# Patient Record
Sex: Female | Born: 1975
Health system: Southern US, Community
[De-identification: ages and names within clinical notes are randomized; demographics above are authoritative.]

## PROBLEM LIST (undated history)

## (undated) DIAGNOSIS — F410 Panic disorder [episodic paroxysmal anxiety] without agoraphobia: Secondary | ICD-10-CM

## (undated) DIAGNOSIS — R000 Tachycardia, unspecified: Secondary | ICD-10-CM

## (undated) DIAGNOSIS — K219 Gastro-esophageal reflux disease without esophagitis: Secondary | ICD-10-CM

## (undated) DIAGNOSIS — E063 Autoimmune thyroiditis: Secondary | ICD-10-CM

## (undated) DIAGNOSIS — E039 Hypothyroidism, unspecified: Secondary | ICD-10-CM

## (undated) DIAGNOSIS — T8859XA Other complications of anesthesia, initial encounter: Secondary | ICD-10-CM

## (undated) DIAGNOSIS — R42 Dizziness and giddiness: Secondary | ICD-10-CM

## (undated) DIAGNOSIS — T7840XA Allergy, unspecified, initial encounter: Secondary | ICD-10-CM

## (undated) DIAGNOSIS — T4145XA Adverse effect of unspecified anesthetic, initial encounter: Secondary | ICD-10-CM

## (undated) DIAGNOSIS — R87619 Unspecified abnormal cytological findings in specimens from cervix uteri: Secondary | ICD-10-CM

## (undated) DIAGNOSIS — K5790 Diverticulosis of intestine, part unspecified, without perforation or abscess without bleeding: Secondary | ICD-10-CM

## (undated) DIAGNOSIS — B159 Hepatitis A without hepatic coma: Secondary | ICD-10-CM

## (undated) DIAGNOSIS — F419 Anxiety disorder, unspecified: Secondary | ICD-10-CM

## (undated) DIAGNOSIS — I10 Essential (primary) hypertension: Secondary | ICD-10-CM

## (undated) DIAGNOSIS — IMO0001 Reserved for inherently not codable concepts without codable children: Secondary | ICD-10-CM

## (undated) DIAGNOSIS — D649 Anemia, unspecified: Secondary | ICD-10-CM

## (undated) DIAGNOSIS — D1802 Hemangioma of intracranial structures: Secondary | ICD-10-CM

## (undated) DIAGNOSIS — R011 Cardiac murmur, unspecified: Secondary | ICD-10-CM

## (undated) DIAGNOSIS — G43909 Migraine, unspecified, not intractable, without status migrainosus: Secondary | ICD-10-CM

## (undated) DIAGNOSIS — N946 Dysmenorrhea, unspecified: Secondary | ICD-10-CM

## (undated) HISTORY — PX: SEPTOPLASTY: SUR1290

## (undated) HISTORY — DX: Diverticulosis of intestine, part unspecified, without perforation or abscess without bleeding: K57.90

## (undated) HISTORY — DX: Reserved for inherently not codable concepts without codable children: IMO0001

## (undated) HISTORY — DX: Unspecified abnormal cytological findings in specimens from cervix uteri: R87.619

## (undated) HISTORY — DX: Tachycardia, unspecified: R00.0

## (undated) HISTORY — DX: Anemia, unspecified: D64.9

## (undated) HISTORY — DX: Gastro-esophageal reflux disease without esophagitis: K21.9

## (undated) HISTORY — DX: Panic disorder (episodic paroxysmal anxiety): F41.0

## (undated) HISTORY — DX: Essential (primary) hypertension: I10

## (undated) HISTORY — DX: Anxiety disorder, unspecified: F41.9

## (undated) HISTORY — DX: Dizziness and giddiness: R42

## (undated) HISTORY — PX: DILATION AND CURETTAGE OF UTERUS: SHX78

## (undated) HISTORY — DX: Dysmenorrhea, unspecified: N94.6

## (undated) HISTORY — DX: Hypothyroidism, unspecified: E03.9

## (undated) HISTORY — DX: Cardiac murmur, unspecified: R01.1

## (undated) HISTORY — DX: Allergy, unspecified, initial encounter: T78.40XA

## (undated) HISTORY — DX: Autoimmune thyroiditis: E06.3

## (undated) HISTORY — DX: Hemangioma of intracranial structures: D18.02

## (undated) HISTORY — DX: Hepatitis a without hepatic coma: B15.9

## (undated) HISTORY — DX: Migraine, unspecified, not intractable, without status migrainosus: G43.909

---
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
## 2005-01-26 DIAGNOSIS — R87619 Unspecified abnormal cytological findings in specimens from cervix uteri: Secondary | ICD-10-CM

## 2005-01-26 HISTORY — PX: COLPOSCOPY: SHX161

## 2005-01-26 HISTORY — DX: Unspecified abnormal cytological findings in specimens from cervix uteri: R87.619

## 2005-01-26 HISTORY — PX: CERVICAL BIOPSY  W/ LOOP ELECTRODE EXCISION: SUR135

## 2005-02-11 ENCOUNTER — Encounter: Admission: RE | Admit: 2005-02-11 | Discharge: 2005-02-11 | Payer: Self-pay | Admitting: *Deleted

## 2007-02-01 ENCOUNTER — Inpatient Hospital Stay (HOSPITAL_COMMUNITY): Admission: AD | Admit: 2007-02-01 | Discharge: 2007-02-01 | Payer: Self-pay | Admitting: Obstetrics & Gynecology

## 2007-04-19 ENCOUNTER — Encounter: Admission: RE | Admit: 2007-04-19 | Discharge: 2007-04-19 | Payer: Self-pay | Admitting: Obstetrics and Gynecology

## 2007-05-23 ENCOUNTER — Encounter: Admission: RE | Admit: 2007-05-23 | Discharge: 2007-05-23 | Payer: Self-pay | Admitting: Obstetrics and Gynecology

## 2008-03-13 ENCOUNTER — Inpatient Hospital Stay (HOSPITAL_COMMUNITY): Admission: AD | Admit: 2008-03-13 | Discharge: 2008-03-15 | Payer: Self-pay | Admitting: Obstetrics & Gynecology

## 2009-02-01 DIAGNOSIS — K5792 Diverticulitis of intestine, part unspecified, without perforation or abscess without bleeding: Secondary | ICD-10-CM | POA: Insufficient documentation

## 2009-02-11 ENCOUNTER — Encounter: Admission: RE | Admit: 2009-02-11 | Discharge: 2009-02-11 | Payer: Self-pay | Admitting: Family Medicine

## 2009-02-19 ENCOUNTER — Encounter: Admission: RE | Admit: 2009-02-19 | Discharge: 2009-02-19 | Payer: Self-pay | Admitting: Sports Medicine

## 2010-01-26 DIAGNOSIS — R000 Tachycardia, unspecified: Secondary | ICD-10-CM

## 2010-01-26 DIAGNOSIS — F419 Anxiety disorder, unspecified: Secondary | ICD-10-CM

## 2010-01-26 DIAGNOSIS — F410 Panic disorder [episodic paroxysmal anxiety] without agoraphobia: Secondary | ICD-10-CM

## 2010-01-26 HISTORY — DX: Tachycardia, unspecified: R00.0

## 2010-01-26 HISTORY — DX: Panic disorder (episodic paroxysmal anxiety): F41.0

## 2010-01-26 HISTORY — DX: Anxiety disorder, unspecified: F41.9

## 2010-02-16 ENCOUNTER — Encounter: Payer: Self-pay | Admitting: Obstetrics and Gynecology

## 2010-03-29 ENCOUNTER — Emergency Department (HOSPITAL_COMMUNITY): Payer: PRIVATE HEALTH INSURANCE

## 2010-03-29 ENCOUNTER — Emergency Department (HOSPITAL_COMMUNITY)
Admission: EM | Admit: 2010-03-29 | Discharge: 2010-03-29 | Disposition: A | Discharge status: Home / Self Care | Payer: PRIVATE HEALTH INSURANCE | Attending: Emergency Medicine | Admitting: Emergency Medicine

## 2010-03-29 DIAGNOSIS — F410 Panic disorder [episodic paroxysmal anxiety] without agoraphobia: Secondary | ICD-10-CM | POA: Insufficient documentation

## 2010-03-29 DIAGNOSIS — R0789 Other chest pain: Secondary | ICD-10-CM | POA: Insufficient documentation

## 2010-03-29 DIAGNOSIS — E039 Hypothyroidism, unspecified: Secondary | ICD-10-CM | POA: Insufficient documentation

## 2010-03-29 DIAGNOSIS — R0682 Tachypnea, not elsewhere classified: Secondary | ICD-10-CM | POA: Insufficient documentation

## 2010-03-29 LAB — URINALYSIS, ROUTINE W REFLEX MICROSCOPIC
Bilirubin Urine: NEGATIVE
Glucose, UA: NEGATIVE mg/dL
Ketones, ur: NEGATIVE mg/dL
Leukocytes, UA: NEGATIVE
Nitrite: NEGATIVE
Protein, ur: 30 mg/dL — AB
Specific Gravity, Urine: 1.014 (ref 1.005–1.030)
Urobilinogen, UA: 0.2 mg/dL (ref 0.0–1.0)
pH: 7.5 (ref 5.0–8.0)

## 2010-03-29 LAB — POCT CARDIAC MARKERS
CKMB, poc: <1 ng/mL — ABNORMAL LOW (ref 1.0–8.0)
Myoglobin, poc: 50.9 ng/mL (ref 12–200)
Troponin i, poc: <0.05 ng/mL (ref 0.00–0.09)

## 2010-03-29 LAB — POCT I-STAT, CHEM 8
BUN: 11 mg/dL (ref 6–23)
Calcium, Ion: 1.16 mmol/L (ref 1.12–1.32)
Chloride: 106 mEq/L (ref 96–112)
Creatinine, Ser: 0.8 mg/dL (ref 0.4–1.2)
Glucose, Bld: 97 mg/dL (ref 70–99)
HCT: 43 % (ref 36.0–46.0)
Hemoglobin: 14.6 g/dL (ref 12.0–15.0)
Potassium: 4.1 mEq/L (ref 3.5–5.1)
Sodium: 140 mEq/L (ref 135–145)
TCO2: 24 mmol/L (ref 0–100)

## 2010-03-29 LAB — PREGNANCY, URINE: Preg Test, Ur: NEGATIVE

## 2010-03-29 LAB — URINE MICROSCOPIC-ADD ON

## 2010-04-13 ENCOUNTER — Emergency Department (HOSPITAL_COMMUNITY)
Admission: EM | Admit: 2010-04-13 | Discharge: 2010-04-13 | Disposition: A | Discharge status: Home / Self Care | Payer: PRIVATE HEALTH INSURANCE | Attending: Emergency Medicine | Admitting: Emergency Medicine

## 2010-04-13 DIAGNOSIS — R0602 Shortness of breath: Secondary | ICD-10-CM | POA: Insufficient documentation

## 2010-04-13 DIAGNOSIS — R209 Unspecified disturbances of skin sensation: Secondary | ICD-10-CM | POA: Insufficient documentation

## 2010-04-13 DIAGNOSIS — E039 Hypothyroidism, unspecified: Secondary | ICD-10-CM | POA: Insufficient documentation

## 2010-04-13 DIAGNOSIS — F411 Generalized anxiety disorder: Secondary | ICD-10-CM | POA: Insufficient documentation

## 2010-04-13 LAB — GLUCOSE, CAPILLARY: Glucose-Capillary: 109 mg/dL — ABNORMAL HIGH (ref 70–99)

## 2010-05-13 LAB — CBC
HCT: 34.4 % — ABNORMAL LOW (ref 36.0–46.0)
HCT: 35.3 % — ABNORMAL LOW (ref 36.0–46.0)
Hemoglobin: 11.6 g/dL — ABNORMAL LOW (ref 12.0–15.0)
Hemoglobin: 11.8 g/dL — ABNORMAL LOW (ref 12.0–15.0)
MCHC: 33.5 g/dL (ref 30.0–36.0)
MCHC: 33.9 g/dL (ref 30.0–36.0)
MCV: 85.5 fL (ref 78.0–100.0)
MCV: 86.5 fL (ref 78.0–100.0)
Platelets: 144 10*3/uL — ABNORMAL LOW (ref 150–400)
Platelets: 152 10*3/uL (ref 150–400)
RBC: 3.97 MIL/uL (ref 3.87–5.11)
RBC: 4.14 MIL/uL (ref 3.87–5.11)
RDW: 13.3 % (ref 11.5–15.5)
RDW: 13.5 % (ref 11.5–15.5)
WBC: 10.2 10*3/uL (ref 4.0–10.5)
WBC: 9.1 10*3/uL (ref 4.0–10.5)

## 2010-05-13 LAB — COMPREHENSIVE METABOLIC PANEL
ALT: 21 U/L (ref 0–35)
AST: 24 U/L (ref 0–37)
Albumin: 2.8 g/dL — ABNORMAL LOW (ref 3.5–5.2)
Alkaline Phosphatase: 159 U/L — ABNORMAL HIGH (ref 39–117)
BUN: 6 mg/dL (ref 6–23)
CO2: 24 mEq/L (ref 19–32)
Calcium: 9 mg/dL (ref 8.4–10.5)
Chloride: 100 mEq/L (ref 96–112)
Creatinine, Ser: 0.65 mg/dL (ref 0.4–1.2)
GFR calc Af Amer: >60 mL/min (ref >60)
GFR calc non Af Amer: >60 mL/min (ref >60)
Glucose, Bld: 105 mg/dL — ABNORMAL HIGH (ref 70–99)
Potassium: 3.5 mEq/L (ref 3.5–5.1)
Sodium: 133 mEq/L — ABNORMAL LOW (ref 135–145)
Total Bilirubin: 0.3 mg/dL (ref 0.3–1.2)
Total Protein: 6.4 g/dL (ref 6.0–8.3)

## 2010-05-13 LAB — RPR: RPR Ser Ql: NONREACTIVE

## 2010-05-13 LAB — LACTATE DEHYDROGENASE: LDH: 134 U/L (ref 94–250)

## 2010-05-13 LAB — URIC ACID: Uric Acid, Serum: 3.5 mg/dL (ref 2.4–7.0)

## 2010-05-13 LAB — HEPATITIS B SURFACE ANTIGEN: Hepatitis B Surface Ag: NEGATIVE

## 2010-09-30 ENCOUNTER — Encounter: Payer: Self-pay | Admitting: Family Medicine

## 2010-09-30 ENCOUNTER — Ambulatory Visit (INDEPENDENT_AMBULATORY_CARE_PROVIDER_SITE_OTHER): Payer: PRIVATE HEALTH INSURANCE | Admitting: Family Medicine

## 2010-09-30 VITALS — BP 110/66 | HR 77 | Temp 98.7°F | Ht 64.0 in | Wt 156.8 lb

## 2010-09-30 DIAGNOSIS — H6500 Acute serous otitis media, unspecified ear: Secondary | ICD-10-CM

## 2010-09-30 DIAGNOSIS — R002 Palpitations: Secondary | ICD-10-CM

## 2010-09-30 NOTE — Progress Notes (Signed)
   Subjective:    Emily Barker is a 35 y.o. female who presents with palpitations. The symptoms are severe, occur in the evening, and last couple  hours per episode. They tend to occur anytime. Cardiac risk factors include: none. Aggravating factors: exercise, stress/anxiety. Relieving factors: none. Associated symptoms: chest pain after work out. Patient denies: cough, dizziness, fatigue, leg swelling, shortness of breath and syncope.  The following portions of the patient's history were reviewed and updated as appropriate: allergies, current medications, past family history, past medical history, past social history, past surgical history and problem list.  Review of Systems Pertinent items are noted in HPI.   Objective:    BP 110/66  Pulse 77  Temp(Src) 98.7 F (37.1 C) (Oral)  Ht 5\' 4"  (1.626 m)  Wt 156 lb 12.8 oz (71.124 kg)  BMI 26.91 kg/m2  SpO2 99% General appearance: alert, cooperative, appears stated age and no distress Neck: no adenopathy, no carotid bruit, no JVD, supple, symmetrical, trachea midline and thyroid not enlarged, symmetric, no tenderness/mass/nodules Lungs: clear to auscultation bilaterally Heart: regular rate and rhythm, S1, S2 normal, no murmur, click, rub or gallop Extremities: extremities normal, atraumatic, no cyanosis or edema Ears--+ cerumen impaction b/l -- after irrigation---+ fluid b/l  Cardiographics ECG: normal sinus rhythm   Assessment:    Palpitations  Cerumen impaction--ears irrigated Serous otitis Plan:  otc antihistamine veramyst Check echo Check event monitor Check labs   Follow up prn

## 2010-09-30 NOTE — Patient Instructions (Signed)
Serous Otitis Media, Fluid in the Middle Ear  (Otitis Media with Effusion) Serous otitis media is also known as otitis media with effusion (OME). It means there is fluid in the middle ear space. This space contains the bones for hearing and air. Air in the middle ear space helps to transmit sound.  The air gets there through the eustachian tube. This tube goes from the back of the throat to the middle ear space. It keeps the pressure in the middle ear the same as the outside world. It also helps to drain fluid from the middle ear space. CAUSES OME occurs when the eustachian tube gets blocked. Blockage can come from:  Ear infections.   Colds and other upper respiratory infections.   Allergies.   Irritants such as cigarette smoke.   Sudden changes in air pressure (such as descending in an airplane).   Enlarged adenoids.  During colds and upper respiratory infections, the middle ear space can become temporarily filled with fluid. This can happen after an ear infection also. Once the infection clears, the fluid will generally drain out of the ear through the eustachian tube. If it does not, then OME occurs. SYMPTOMS  Hearing loss.   A feeling of fullness in the ear - but no pain.   Young children may not show any symptoms.  DIAGNOSIS  Diagnosis of OME is made by an ear exam.   Tests may be done to check on the movement of the eardrum.   Hearing exams may be done.  TREATMENT  The fluid most often goes away without treatment.   If allergy is the cause, allergy treatment may be helpful.   Fluid that persists for several months may require minor surgery. A small tube is placed in the ear drum to:   Drain the fluid.   Restore the air in the middle ear space.   In certain situations, antibiotics are used to avoid surgery.   Surgery may be done to remove enlarged adenoids (if this is the cause).  HOME CARE INSTRUCTIONS  Keep children away from tobacco smoke.   Be sure to keep  follow up appointments, if any.  SEEK MEDICAL CARE IF:  Hearing is not better in 3 months.   Hearing is worse.   Ear pain.   Drainage from the ear.   Dizziness.  Document Released: 04/04/2003 Document Re-Released: 05/31/2008 [facility] Patient Information 2011 [place], [place].

## 2010-10-01 LAB — CBC WITH DIFFERENTIAL/PLATELET
Basophils Absolute: 0 10*3/uL (ref 0.0–0.1)
Basophils Relative: 0.4 % (ref 0.0–3.0)
Eosinophils Absolute: 0 10*3/uL (ref 0.0–0.7)
Eosinophils Relative: 0 % (ref 0.0–5.0)
HCT: 37 % (ref 36.0–46.0)
Hemoglobin: 12.2 g/dL (ref 12.0–15.0)
Lymphocytes Relative: 44.1 % (ref 12.0–46.0)
Lymphs Abs: 2.8 10*3/uL (ref 0.7–4.0)
MCHC: 33.1 g/dL (ref 30.0–36.0)
MCV: 82.3 fl (ref 78.0–100.0)
Monocytes Absolute: 0.4 10*3/uL (ref 0.1–1.0)
Monocytes Relative: 6.6 % (ref 3.0–12.0)
Neutro Abs: 3.1 10*3/uL (ref 1.4–7.7)
Neutrophils Relative %: 48.9 % (ref 43.0–77.0)
Platelets: 203 10*3/uL (ref 150.0–400.0)
RBC: 4.49 Mil/uL (ref 3.87–5.11)
RDW: 14 % (ref 11.5–14.6)
WBC: 6.4 10*3/uL (ref 4.5–10.5)

## 2010-10-01 LAB — BASIC METABOLIC PANEL
BUN: 12 mg/dL (ref 6–23)
CO2: 30 mEq/L (ref 19–32)
Calcium: 9.1 mg/dL (ref 8.4–10.5)
Chloride: 102 mEq/L (ref 96–112)
Creatinine, Ser: 0.6 mg/dL (ref 0.4–1.2)
GFR: 119.43 mL/min (ref >60.00)
Glucose, Bld: 80 mg/dL (ref 70–99)
Potassium: 4.1 mEq/L (ref 3.5–5.1)
Sodium: 139 mEq/L (ref 135–145)

## 2010-10-01 LAB — HEPATIC FUNCTION PANEL
ALT: 9 U/L (ref 0–35)
AST: 16 U/L (ref 0–37)
Albumin: 4.2 g/dL (ref 3.5–5.2)
Alkaline Phosphatase: 60 U/L (ref 39–117)
Bilirubin, Direct: 0.1 mg/dL (ref 0.0–0.3)
Total Bilirubin: 0.4 mg/dL (ref 0.3–1.2)
Total Protein: 7.2 g/dL (ref 6.0–8.3)

## 2010-10-01 LAB — TSH: TSH: 0.88 u[IU]/mL (ref 0.35–5.50)

## 2010-10-01 LAB — VITAMIN B12: Vitamin B-12: 1086 pg/mL — ABNORMAL HIGH (ref 211–911)

## 2010-10-08 ENCOUNTER — Encounter (INDEPENDENT_AMBULATORY_CARE_PROVIDER_SITE_OTHER): Payer: PRIVATE HEALTH INSURANCE

## 2010-10-08 ENCOUNTER — Other Ambulatory Visit (HOSPITAL_COMMUNITY): Payer: PRIVATE HEALTH INSURANCE | Admitting: Radiology

## 2010-10-08 DIAGNOSIS — R002 Palpitations: Secondary | ICD-10-CM

## 2010-10-09 ENCOUNTER — Other Ambulatory Visit (HOSPITAL_COMMUNITY): Payer: PRIVATE HEALTH INSURANCE | Admitting: Radiology

## 2010-10-10 ENCOUNTER — Ambulatory Visit (HOSPITAL_COMMUNITY): Payer: PRIVATE HEALTH INSURANCE | Attending: Family Medicine | Admitting: Radiology

## 2010-10-10 DIAGNOSIS — R002 Palpitations: Secondary | ICD-10-CM | POA: Insufficient documentation

## 2010-10-13 ENCOUNTER — Telehealth: Payer: Self-pay

## 2010-10-13 NOTE — Telephone Encounter (Signed)
mssg left to call back     KP 

## 2010-10-13 NOTE — Telephone Encounter (Signed)
Message copied by Arnette Norris on Mon Oct 13, 2010  1:27 PM ------      Message from: Lelon Perla      Created: Sun Oct 12, 2010  7:56 PM       Normal echo

## 2010-10-13 NOTE — Telephone Encounter (Signed)
YES

## 2010-10-13 NOTE — Telephone Encounter (Signed)
Spoke with patient and she wanted to know should she still wear the holter monitor for 1 month since the Echo was normal..Marland KitchenPlease advise   KP

## 2010-10-16 LAB — RH IMMUNE GLOBULIN WORKUP (NOT WOMEN'S HOSP)
ABO/RH(D): O NEG
Antibody Screen: NEGATIVE

## 2010-10-16 LAB — HCG, QUANTITATIVE, PREGNANCY: hCG, Beta Chain, Quant, S: 149 — ABNORMAL HIGH

## 2010-10-20 NOTE — Telephone Encounter (Signed)
mssg left to call the office    KP 

## 2010-10-24 NOTE — Telephone Encounter (Signed)
Letter mailed to contact the office.     KP 

## 2010-11-25 ENCOUNTER — Telehealth: Payer: Self-pay | Admitting: Family Medicine

## 2010-11-25 MED ORDER — THYROID 60 MG PO TABS: 60.0000 mg | ORAL_TABLET | Freq: Every day | ORAL | Status: DC

## 2010-11-25 MED ORDER — ALPRAZOLAM 0.25 MG PO TABS: 0.2500 mg | ORAL_TABLET | Freq: Three times a day (TID) | ORAL | Status: DC | PRN

## 2010-11-25 MED ORDER — BUTALBITAL-APAP-CAFFEINE 50-500-40 MG PO TABS: 1.0000 | ORAL_TABLET | ORAL | Status: AC | PRN

## 2010-11-25 NOTE — Telephone Encounter (Signed)
Xanax 0.25 mg 1 po tid prn  #30   Ok to refill others x1

## 2010-11-25 NOTE — Telephone Encounter (Signed)
Faxed.   KP 

## 2010-11-25 NOTE — Telephone Encounter (Signed)
Xanax not on med list please advise      KP

## 2010-11-25 NOTE — Telephone Encounter (Signed)
Patient needs refill for Armour thyroid 60 mg - xanax .25 mg - [facility] - [facility] - [address]

## 2011-01-27 DIAGNOSIS — D1802 Hemangioma of intracranial structures: Secondary | ICD-10-CM

## 2011-01-27 HISTORY — DX: Hemangioma of intracranial structures: D18.02

## 2011-02-18 ENCOUNTER — Encounter (HOSPITAL_BASED_OUTPATIENT_CLINIC_OR_DEPARTMENT_OTHER): Payer: Self-pay

## 2011-02-18 ENCOUNTER — Ambulatory Visit (INDEPENDENT_AMBULATORY_CARE_PROVIDER_SITE_OTHER): Payer: PRIVATE HEALTH INSURANCE | Admitting: Family Medicine

## 2011-02-18 ENCOUNTER — Ambulatory Visit (HOSPITAL_BASED_OUTPATIENT_CLINIC_OR_DEPARTMENT_OTHER)
Admission: RE | Admit: 2011-02-18 | Discharge: 2011-02-18 | Disposition: A | Discharge status: Home / Self Care | Payer: PRIVATE HEALTH INSURANCE | Source: Ambulatory Visit | Attending: Family Medicine | Admitting: Family Medicine

## 2011-02-18 ENCOUNTER — Encounter: Payer: Self-pay | Admitting: Family Medicine

## 2011-02-18 VITALS — BP 112/68 | HR 100 | Temp 98.7°F | Wt 158.4 lb

## 2011-02-18 DIAGNOSIS — R1031 Right lower quadrant pain: Secondary | ICD-10-CM | POA: Insufficient documentation

## 2011-02-18 LAB — POCT URINALYSIS DIPSTICK
Blood, UA: NEGATIVE
Glucose, UA: NEGATIVE
Ketones, UA: NEGATIVE
Leukocytes, UA: NEGATIVE
Nitrite, UA: NEGATIVE
Protein, UA: 30
Spec Grav, UA: 1.025
Urobilinogen, UA: 0.2
pH, UA: 6

## 2011-02-18 LAB — POCT URINE PREGNANCY: Preg Test, Ur: NEGATIVE

## 2011-02-18 MED ORDER — METRONIDAZOLE 500 MG PO TABS: 500.0000 mg | ORAL_TABLET | Freq: Three times a day (TID) | ORAL | Status: AC

## 2011-02-18 MED ORDER — HYDROCODONE-ACETAMINOPHEN 5-500 MG PO TABS: 1.0000 | ORAL_TABLET | Freq: Three times a day (TID) | ORAL | Status: AC | PRN

## 2011-02-18 MED ORDER — IOHEXOL 300 MG/ML  SOLN
100.0000 mL | Freq: Once | INTRAMUSCULAR | Status: AC | PRN
  Administered 2011-02-18: 100 mL via INTRAVENOUS

## 2011-02-18 MED ORDER — CIPROFLOXACIN HCL 500 MG PO TABS: 500.0000 mg | ORAL_TABLET | Freq: Two times a day (BID) | ORAL | Status: AC

## 2011-02-18 NOTE — Progress Notes (Signed)
Subjective:     Emily Barker is a 36 y.o. female who presents for evaluation of abdominal pain. Onset was a few days ago. Symptoms have been gradually worsening. The pain is described as pressure-like and sharp, and is 10/10 in intensity. Pain is located in the RLQ and suprapubic region without radiation.  Aggravating factors: activity and movement.  Alleviating factors: NSAIDs. Associated symptoms: none. The patient denies anorexia, arthralagias, belching, chills, constipation, diarrhea, dysuria, fever, flatus, frequency, headache, hematochezia, hematuria, melena, myalgias, nausea, sweats and vomiting.  The patient's history has been marked as reviewed and updated as appropriate.  Review of Systems Pertinent items are noted in HPI.     Objective:    BP 112/68  Pulse 100  Temp(Src) 98.7 F (37.1 C) (Oral)  Wt 158 lb 6.4 oz (71.85 kg)  SpO2 98% General appearance: alert, cooperative, appears stated age and no distress Abdomen: normal findings: bowel sounds normal and soft and abnormal findings:  rebound tenderness and marked tenderness in the RLQ and suprapubic area Extremities: extremities normal, atraumatic, no cyanosis or edema    Assessment:    Abdominal pain,---RLQ .    Plan:    See orders for lab and imaging studies. Adhere to simple, bland diet. Further follow-up plans will be based on outcome of lab/imaging studies; see orders. Follow up as needed. GO to ED if symptoms worsen  2Subjective:     Emily Barker is a 36 y.o. female who presents for evaluation of abdominal pain. Onset was a few days ago. Symptoms have been gradually worsening. The pain is described as cramping, pressure-like and sharp, and is 8/10 in intensity. Pain is located in the RLQ and suprapubic region without radiation.  Aggravating factors: activity and movement.  Alleviating factors: none. Associated symptoms: none. The patient denies anorexia, arthralagias, belching, chills, constipation, diarrhea,  dysuria, fever, flatus, frequency, headache, hematochezia, hematuria, melena, myalgias, nausea, sweats and vomiting.  Family History  Problem Relation Age of Onset  . Prostate cancer Father   . Hypertension Father   . Alcohol abuse Father   . Heart disease Father   . Hyperlipidemia Mother   . Hypertension Brother   . Alcohol abuse Paternal Grandfather   . Heart disease Paternal Grandfather   . Breast cancer Maternal Aunt   . Depression Maternal Aunt   . Depression Maternal Uncle   . Heart disease Paternal Aunt   . Dementia Maternal Grandmother   . Heart disease Maternal Grandfather   . Heart disease Paternal Grandmother   . Depression Maternal Aunt    History   Social History  . Marital Status: Married    Spouse Name: N/A    Number of Children: N/A  . Years of Education: N/A   Occupational History  . Not on file.   Social History Main Topics  . Smoking status: Never Smoker   . Smokeless tobacco: Never Used  . Alcohol Use: Yes  . Drug Use: No  . Sexually Active: Not on file   Other Topics Concern  . Not on file   Social History Narrative  . No narrative on file   Past Medical History  Diagnosis Date  . Migraines   . GERD (gastroesophageal reflux disease)   . Heart murmur     as a child  . Hepatitis A   . Hypertension   . Hypothyroid     Review of Systems As above   Objective:    BP 112/68  Pulse 100  Temp(Src) 98.7 F (37.1  C) (Oral)  Wt 158 lb 6.4 oz (71.85 kg)  SpO2 98%  LMP 01/27/2011 General appearance: alert, cooperative, appears stated age and mild distress Lungs: clear to auscultation bilaterally Heart: S1, S2 normal Abdomen: normal findings: no masses palpable and soft and abnormal findings:  marked tenderness in the LLQ and suprapubic area Lymph nodes: Cervical, supraclavicular, and axillary nodes normal.   Assessment:    Abdominal pain, likely secondary to unsure--- r/o app vs diverticulitis.    Plan:    The diagnosis was  discussed with the patient and evaluation and treatment plans outlined. Adhere to simple, bland diet. Initiate empiric trial of acid suppression; see orders. Further follow-up plans will be based on outcome of lab/imaging studies; see orders. Follow up as needed. ct abd/pelvis

## 2011-02-18 NOTE — Patient Instructions (Signed)

## 2011-02-19 ENCOUNTER — Encounter: Payer: Self-pay | Admitting: Family Medicine

## 2011-06-11 ENCOUNTER — Other Ambulatory Visit: Payer: Self-pay | Admitting: Family Medicine

## 2011-07-15 ENCOUNTER — Other Ambulatory Visit: Payer: Self-pay

## 2011-07-15 ENCOUNTER — Other Ambulatory Visit: Payer: Self-pay | Admitting: Family Medicine

## 2011-07-15 MED ORDER — THYROID 60 MG PO TABS: 60.0000 mg | ORAL_TABLET | Freq: Every day | ORAL | Status: DC

## 2011-07-16 ENCOUNTER — Other Ambulatory Visit: Payer: Self-pay | Admitting: Family Medicine

## 2011-07-16 MED ORDER — THYROID 60 MG PO TABS: 60.0000 mg | ORAL_TABLET | Freq: Every day | ORAL | Status: DC

## 2011-09-21 ENCOUNTER — Ambulatory Visit (INDEPENDENT_AMBULATORY_CARE_PROVIDER_SITE_OTHER): Payer: PRIVATE HEALTH INSURANCE | Admitting: Family Medicine

## 2011-09-21 ENCOUNTER — Encounter: Payer: Self-pay | Admitting: Family Medicine

## 2011-09-21 VITALS — BP 124/90 | HR 97 | Temp 98.3°F | Wt 164.2 lb

## 2011-09-21 DIAGNOSIS — R079 Chest pain, unspecified: Secondary | ICD-10-CM

## 2011-09-21 DIAGNOSIS — I10 Essential (primary) hypertension: Secondary | ICD-10-CM

## 2011-09-21 DIAGNOSIS — R000 Tachycardia, unspecified: Secondary | ICD-10-CM

## 2011-09-21 MED ORDER — METOPROLOL SUCCINATE ER 25 MG PO TB24: 25.0000 mg | ORAL_TABLET | Freq: Every day | ORAL | Status: DC

## 2011-09-21 NOTE — Progress Notes (Signed)
  Subjective:    Emily Barker is a 36 y.o. female who presents for evaluation of elevated blood pressures. Age at onset of elevated blood pressure:  36. Cardiac symptoms: chest pain and tachycardia. Patient denies: claudication, dyspnea, fatigue, irregular heart beat, lower extremity edema, near-syncope, orthopnea, palpitations, paroxysmal nocturnal dyspnea, syncope and tachypnea. Cardiovascular risk factors: family history of premature cardiovascular disease. Use of agents associated with hypertension: none. History of target organ damage: none.  The following portions of the patient's history were reviewed and updated as appropriate: allergies, current medications, past family history, past medical history, past social history, past surgical history and problem list.  Review of Systems Pertinent items are noted in HPI.   Objective:    BP 124/90  Pulse 97  Temp 98.3 F (36.8 C) (Oral)  Wt 164 lb 3.2 oz (74.481 kg)  SpO2 99% General appearance: alert, cooperative, appears stated age and no distress Lungs: clear to auscultation bilaterally Heart: regular rate and rhythm, S1, S2 normal, no murmur, click, rub or gallop Extremities: extremities normal, atraumatic, no cyanosis or edema  Cardiographics ECG: normal sinus rhythm, sinus tachycardia    Assessment:    Hypertension, stage 1 . Evidence of target organ damage: none.   tachycardia Plan:    Medication: begin toprol. Dietary sodium restriction. Regular aerobic exercise. Check blood pressures 2-3 times weekly and record. Follow up: 2 weeks and as needed.

## 2011-09-21 NOTE — Assessment & Plan Note (Signed)
toprol Echo done last year

## 2011-09-21 NOTE — Patient Instructions (Signed)

## 2011-09-21 NOTE — Assessment & Plan Note (Signed)
toprol xl 25mg  1/2 tab po qd for 1 week inc to whole tab if tolerating 1/2

## 2011-09-29 ENCOUNTER — Emergency Department (HOSPITAL_COMMUNITY)
Admission: EM | Admit: 2011-09-29 | Discharge: 2011-09-29 | Disposition: A | Discharge status: Home / Self Care | Payer: BC Managed Care – PPO | Attending: Emergency Medicine | Admitting: Emergency Medicine

## 2011-09-29 ENCOUNTER — Encounter (HOSPITAL_COMMUNITY): Payer: Self-pay | Admitting: Family Medicine

## 2011-09-29 ENCOUNTER — Telehealth: Payer: Self-pay | Admitting: Family Medicine

## 2011-09-29 DIAGNOSIS — F419 Anxiety disorder, unspecified: Secondary | ICD-10-CM

## 2011-09-29 DIAGNOSIS — R079 Chest pain, unspecified: Secondary | ICD-10-CM

## 2011-09-29 DIAGNOSIS — Z79899 Other long term (current) drug therapy: Secondary | ICD-10-CM | POA: Insufficient documentation

## 2011-09-29 DIAGNOSIS — E079 Disorder of thyroid, unspecified: Secondary | ICD-10-CM

## 2011-09-29 DIAGNOSIS — I10 Essential (primary) hypertension: Secondary | ICD-10-CM | POA: Insufficient documentation

## 2011-09-29 DIAGNOSIS — E039 Hypothyroidism, unspecified: Secondary | ICD-10-CM | POA: Insufficient documentation

## 2011-09-29 DIAGNOSIS — R5381 Other malaise: Secondary | ICD-10-CM | POA: Insufficient documentation

## 2011-09-29 DIAGNOSIS — K219 Gastro-esophageal reflux disease without esophagitis: Secondary | ICD-10-CM | POA: Insufficient documentation

## 2011-09-29 LAB — CBC
HCT: 37.5 % (ref 36.0–46.0)
Hemoglobin: 12.6 g/dL (ref 12.0–15.0)
MCH: 26.3 pg (ref 26.0–34.0)
MCHC: 33.6 g/dL (ref 30.0–36.0)
MCV: 78.3 fL (ref 78.0–100.0)
Platelets: 220 10*3/uL (ref 150–400)
RBC: 4.79 MIL/uL (ref 3.87–5.11)
RDW: 13.8 % (ref 11.5–15.5)
WBC: 6.9 10*3/uL (ref 4.0–10.5)

## 2011-09-29 LAB — POCT I-STAT TROPONIN I
Troponin i, poc: 0 ng/mL (ref 0.00–0.08)
Troponin i, poc: 0 ng/mL (ref 0.00–0.08)

## 2011-09-29 LAB — BASIC METABOLIC PANEL
BUN: 9 mg/dL (ref 6–23)
CO2: 25 mEq/L (ref 19–32)
Calcium: 9.7 mg/dL (ref 8.4–10.5)
Chloride: 99 mEq/L (ref 96–112)
Creatinine, Ser: 0.63 mg/dL (ref 0.50–1.10)
GFR calc Af Amer: >90 mL/min (ref >90)
GFR calc non Af Amer: >90 mL/min (ref >90)
Glucose, Bld: 92 mg/dL (ref 70–99)
Potassium: 3.4 mEq/L — ABNORMAL LOW (ref 3.5–5.1)
Sodium: 137 mEq/L (ref 135–145)

## 2011-09-29 LAB — TROPONIN I: Troponin I: <0.3 ng/mL (ref <0.30)

## 2011-09-29 MED ORDER — POTASSIUM CHLORIDE CRYS ER 20 MEQ PO TBCR
40.0000 meq | EXTENDED_RELEASE_TABLET | Freq: Once | ORAL | Status: AC
  Administered 2011-09-29: 40 meq via ORAL
  Filled 2011-09-29: qty 2

## 2011-09-29 NOTE — Telephone Encounter (Signed)
Caller: [name]/Patient; Patient Name: Emily Barker; PCP: Lelon Perla.; Best Callback Phone Number: [phone]; Reason for call:  intermittent stomach pains that feels like gas and nausea with intermittent upper L chest above breast, pressure in head at times and heaviness of limbs/head and neck at times.  Declined 911 when call screened by CSR.  Relates symptoms to start of new RX, Metoprolol 09/22/11.  Afebrile. Also can palpate enlargement Left side of thyroid; would like it checked.  Currently has pain from L shoulder, over left breast and down Left side to umbilicus.  BP 137/84  Pulse 84 at 0700.  Taking 12.5 mg of Metoprolol daily.  Also notes > panic attacks that have not been present for ages.  Brother. RPh, said medication can > anxiety.  Advised to call 911 now for chest pain spreading to shoulders and stomach lasting more than 5 minutes now or within the last hour per Chest Pain guideline.  Agreed to call 911 now after RN explained symptoms and benefits of expedited care.   Info noted and sent to MD via [id number]  Can Pool per nursing judgement.

## 2011-09-29 NOTE — Telephone Encounter (Signed)
Metoprolol can cause depression-- not anxiety.   We use beta blockers to treat some forms of anxiety.   Ov tomorrow

## 2011-09-29 NOTE — ED Provider Notes (Signed)
History     CSN: [id number]  Arrival date & time 09/29/11  1641   First MD Initiated Contact with Patient 09/29/11 1848      Chief Complaint  Patient presents with  . Chest Pain    (Consider location/radiation/quality/duration/timing/severity/associated sxs/prior treatment) HPI Comments: Mrs. Pall presents for evaluation of chest discomfort.  She is unsure if it is secondary to anxiety, her new blood pressure medication, or some other issue.  She states she has had several episodes of dull chest discomfort associated with a generalized fatigue, arm and head heaviness, and an anxious sensation over the last 3 days.  This evening it became more noticeable prompting her to seek medical attention.  She started toprol 12.5mg  po daily secondary to mild hypertension 3-4 days ago (has had a persistently elevated diastolic BP).  She also reports a hx of anxiety/panic disorder.  She has taken medication in the past but over the last 6 months, she has rarely used any.  Patient is a 36 y.o. female presenting with chest pain. The history is provided by the patient. No language interpreter was used.  Chest Pain The chest pain began 2 days ago. Duration of episode(s) is 30 minutes. Chest pain occurs intermittently. The chest pain is resolved. At its most intense, the pain is at 4/10. The pain is currently at 0/10. The severity of the pain is mild. The quality of the pain is described as dull, pressure-like and tightness. The pain does not radiate. Primary symptoms include fatigue and nausea. Pertinent negatives for primary symptoms include no fever, no syncope, no shortness of breath, no cough, no wheezing, no palpitations, no abdominal pain, no vomiting, no dizziness and no altered mental status.  Associated symptoms include weakness.  Pertinent negatives for associated symptoms include no claudication, no diaphoresis, no lower extremity edema, no near-syncope, no numbness, no orthopnea and no paroxysmal  nocturnal dyspnea. Treatments tried: alprazolam - improvement was noted.  Her past medical history is significant for anxiety/panic attacks and hypertension.  Pertinent negatives for past medical history include no aneurysm, no aortic aneurysm, no aortic dissection, no CAD, no congenital heart disease, no COPD, no CHF, no diabetes, no DVT, no hyperlipidemia, no MI, no PE, no spontaneous pneumothorax, no stimulant use and no strokes. Past medical history comments: hx preeclampsia, sinus tachycaria/palpitations  Her family medical history is significant for heart disease in family. Family history comments: heart aneurysms  Procedure history is positive for echocardiogram.     Past Medical History  Diagnosis Date  . Migraines   . GERD (gastroesophageal reflux disease)   . Heart murmur     as a child  . Hepatitis A   . Hypertension   . Hypothyroid     Past Surgical History  Procedure Date  . Tonsillectomy and adenoidectomy 1988  . Cesarean section     Family History  Problem Relation Age of Onset  . Prostate cancer Father   . Hypertension Father   . Alcohol abuse Father   . Heart disease Father   . Hyperlipidemia Mother   . Hypertension Brother   . Alcohol abuse Paternal Grandfather   . Heart disease Paternal Grandfather   . Breast cancer Maternal Aunt   . Depression Maternal Aunt   . Depression Maternal Uncle   . Heart disease Paternal Aunt   . Dementia Maternal Grandmother   . Heart disease Maternal Grandfather   . Heart disease Paternal Grandmother   . Depression Maternal Aunt     History  Substance Use Topics  . Smoking status: Never Smoker   . Smokeless tobacco: Never Used  . Alcohol Use: Yes    OB History    Grav Para Term Preterm Abortions TAB SAB Ect Mult Living                  Review of Systems  Constitutional: Positive for fatigue. Negative for fever and diaphoresis.  Respiratory: Negative for cough, shortness of breath and wheezing.     Cardiovascular: Positive for chest pain. Negative for palpitations, orthopnea, claudication, syncope and near-syncope.  Gastrointestinal: Positive for nausea. Negative for vomiting and abdominal pain.  Neurological: Positive for weakness. Negative for dizziness and numbness.  Psychiatric/Behavioral: Negative for altered mental status.    Allergies  Penicillins  Home Medications   Current Outpatient Rx  Name Route Sig Dispense Refill  . ALPRAZOLAM 0.25 MG PO TABS Oral Take 0.125 mg by mouth 3 (three) times daily as needed. Anxiety. Take 1/2 tablet if needed for anxiety.    Marland Kitchen MELATONIN 1 MG PO TABS Oral Take 1 tablet by mouth at bedtime as needed. Sleep.    Marland Kitchen METOPROLOL SUCCINATE ER 25 MG PO TB24 Oral Take 12.5 mg by mouth daily.    . THYROID 60 MG PO TABS Oral Take 60 mg by mouth daily.    . CYANOCOBALAMIN 250 MCG PO TABS Oral Take 250 mcg by mouth daily.        BP 139/87  Pulse 73  Temp 98.8 F (37.1 C) (Oral)  Resp 18  SpO2 100%  LMP 08/29/2011  Physical Exam  Nursing note and vitals reviewed. Constitutional: She is oriented to person, place, and time. She appears well-developed and well-nourished. No distress.  HENT:  Head: Normocephalic and atraumatic.  Right Ear: External ear normal.  Left Ear: External ear normal.  Nose: Nose normal.  Mouth/Throat: Oropharynx is clear and moist. No oropharyngeal exudate.  Eyes: Conjunctivae and EOM are normal. Pupils are equal, round, and reactive to light. Right eye exhibits no discharge. Left eye exhibits no discharge. No scleral icterus.  Neck: Normal range of motion. Neck supple. No JVD present. No tracheal deviation present. Thyromegaly present.       Left-sided small thyroid prominence/nodule.  Nontender, no overlying skin changes.  Cardiovascular: Normal rate, regular rhythm, normal heart sounds and intact distal pulses.  Exam reveals no gallop and no friction rub.   No murmur heard. Pulmonary/Chest: Effort normal and breath  sounds normal. No stridor. No respiratory distress. She has no wheezes. She has no rales. She exhibits no tenderness.  Abdominal: Soft. Bowel sounds are normal. She exhibits no distension and no mass. There is no tenderness. There is no rebound and no guarding.  Musculoskeletal: Normal range of motion. She exhibits no edema and no tenderness.  Lymphadenopathy:    She has no cervical adenopathy.  Neurological: She is alert and oriented to person, place, and time. No cranial nerve deficit. Coordination normal.  Skin: Skin is warm and dry. No rash noted. She is not diaphoretic. No erythema. No pallor.  Psychiatric: She has a normal mood and affect. Her behavior is normal.    ED Course  Procedures (including critical care time)  Labs Reviewed  BASIC METABOLIC PANEL - Abnormal; Notable for the following:    Potassium 3.4 (*)     All other components within normal limits  CBC  TROPONIN I  POCT I-STAT TROPONIN I   No results found.   No diagnosis found.   Date:  09/29/2011  Rate: 86 bpm  Rhythm: normal sinus rhythm  QRS Axis: normal  Intervals: normal  ST/T Wave abnormalities: normal  Conduction Disutrbances:none  Narrative Interpretation:   Old EKG Reviewed: unchanged      MDM  Pt presents for evaluation of chest discomfort.  She reports the abnormal pressure sensation has resolved.  She started toprol for treatment of htn within the last 4 days and reports having a funny sensation of heaviness within her head and arms since beginning the medication.  Toprol was chosen by her PMD because she also has a hx of anxiety and sinus tachycardia.  She reports leading an active lifestyle with daily exercise, a diet low in fats (vegan diet), and a low stress home life.  She does have some maternal uncles that have had heart disease and related health issues in their 2s.  She denies risk factors for thromboembolic event and denies respiratory symptoms.  She also has had no fevers.  She is  currently pain free.  Note nl CBC, trop x1, EKG, but mild hypokalemia on BMP.  Will replete po.  Will repeat the troponin at 3-4 hours.  If neg, plan d/c home to f/u with PMD and cardiologist as an outpt.  Incidental exam finding of a left-sided thyroid prominence or nodule.  Encouraged follow-up with her PMD for further labs and imaging if necessary.  2100.  Pt stable, NAD.  Trop neg x2.  Pt is low risk for thromboembolic event by hx.  Plan d/c home with close outpt f/u with her PMD.  Reiterated the importance of f/u for further evaluation of a thyroid nodule.    Tobin Chad, MD 09/29/11 2108

## 2011-09-29 NOTE — Telephone Encounter (Signed)
I tried calling the patient VM left to call the office. Please call an offer her an apt.     KP

## 2011-09-29 NOTE — ED Notes (Signed)
Pt reports on Sunday began having a "heaviness to limbs and face" and pain in left shoulder down left chest and into abdomen. Reports becoming very diaphoretic and nauseated. States these episodes come and go.

## 2011-09-30 NOTE — Telephone Encounter (Signed)
Scheduled for 9.5.13 @ 130pm 15-monutes only

## 2011-09-30 NOTE — Telephone Encounter (Signed)
Called 853am 9.4.13- pt on home # ABM LM to call back & schedule 15-minute appt for CAN note ok per dr.lowne

## 2011-10-01 ENCOUNTER — Ambulatory Visit (INDEPENDENT_AMBULATORY_CARE_PROVIDER_SITE_OTHER): Payer: BC Managed Care – PPO | Admitting: Family Medicine

## 2011-10-01 ENCOUNTER — Ambulatory Visit: Payer: PRIVATE HEALTH INSURANCE | Admitting: Family Medicine

## 2011-10-01 ENCOUNTER — Encounter: Payer: Self-pay | Admitting: Family Medicine

## 2011-10-01 VITALS — BP 118/72 | HR 80 | Temp 98.6°F | Wt 161.4 lb

## 2011-10-01 DIAGNOSIS — F419 Anxiety disorder, unspecified: Secondary | ICD-10-CM

## 2011-10-01 DIAGNOSIS — F411 Generalized anxiety disorder: Secondary | ICD-10-CM

## 2011-10-01 DIAGNOSIS — I10 Essential (primary) hypertension: Secondary | ICD-10-CM

## 2011-10-01 DIAGNOSIS — E041 Nontoxic single thyroid nodule: Secondary | ICD-10-CM

## 2011-10-01 MED ORDER — ALPRAZOLAM 0.25 MG PO TABS: 0.1250 mg | ORAL_TABLET | Freq: Three times a day (TID) | ORAL | Status: DC | PRN

## 2011-10-01 NOTE — Patient Instructions (Signed)
Thyroid Diseases Your thyroid is a butterfly-shaped gland in your neck. It is located just above your collarbone. It is one of your endocrine glands, which make hormones. The thyroid helps set your metabolism. Metabolism is how your body gets energy from the foods you eat.  Millions of people have thyroid diseases. Women experience thyroid problems more often than men. In fact, overactive thyroid problems (hyperthyroidism) occur in 1% of all women. If you have a thyroid disease, your body may use energy more slowly or quickly than it should.  Thyroid problems also include an immune disease where your body reacts against your thyroid gland (called thyroiditis). A different problem involves lumps and bumps (called nodules) that develop in the gland. The nodules are usually, but not always, noncancerous. THE MOST COMMON THYROID PROBLEMS AND CAUSES ARE DISCUSSED BELOW There are many causes for thyroid problems. Treatment depends upon the exact diagnosis and includes trying to reset your body's metabolism to a normal rate. Hyperthyroidism Too much thyroid hormone from an overactive thyroid gland is called hyperthyroidism. In hyperthyroidism, the body's metabolism speeds up. One of the most frequent forms of hyperthyroidism is known as Graves' disease. Graves' disease tends to run in families. Although Graves' is thought to be caused by a problem with the immune system, the exact nature of the genetic problem is unknown. Hypothyroidism Too little thyroid hormone from an underactive thyroid gland is called hypothyroidism. In hypothyroidism, the body's metabolism is slowed. Several things can cause this condition. Most causes affect the thyroid gland directly and hurt its ability to make enough hormone.  Rarely, there may be a pituitary gland tumor (located near the base of the brain). The tumor can block the pituitary from producing thyroid-stimulating hormone (TSH). Your body makes TSH to stimulate the thyroid  to work properly. If the pituitary does not make enough TSH, the thyroid fails to make enough hormones needed for good health. Whether the problem is caused by thyroid conditions or by the pituitary gland, the result is that the thyroid is not making enough hormones. Hypothyroidism causes many physical and mental processes to become sluggish. The body consumes less oxygen and produces less body heat. Thyroid Nodules A thyroid nodule is a small swelling or lump in the thyroid gland. They are common. These nodules represent either a growth of thyroid tissue or a fluid-filled cyst. Both form a lump in the thyroid gland. Almost half of all people will have tiny thyroid nodules at some point in their lives. Typically, these are not noticeable until they become large and affect normal thyroid size. Larger nodules that are greater than a half inch across (about 1 centimeter) occur in about 5 percent of people. Although most nodules are not cancerous, people who have them should seek medical care to rule out cancer. Also, some thyroid nodules may produce too much thyroid hormone or become too large. Large nodules or a large gland can interfere with breathing or swallowing or may cause neck discomfort. Other problems Other thyroid problems include cancer and thyroiditis. Thyroiditis is a malfunction of the body's immune system. Normally, the immune system works to defend the body against infection and other problems. When the immune system is not working properly, it may mistakenly attack normal cells, tissues, and organs. Examples of autoimmune diseases are Hashimoto's thyroiditis (which causes low thyroid function) and Graves' disease (which causes excess thyroid function). SYMPTOMS  Symptoms vary greatly depending upon the exact type of problem with the thyroid. Hyperthyroidism-is when your thyroid is too   active and makes more thyroid hormone than your body needs. The most common cause is Graves' Disease. Too  much thyroid hormone can cause some or all of the following symptoms:  Anxiety.   Irritability.   Difficulty sleeping.   Fatigue.   A rapid or irregular heartbeat.   A fine tremor of your hands or fingers.   An increase in perspiration.   Sensitivity to heat.   Weight loss, despite normal food intake.   Brittle hair.   Enlargement of your thyroid gland (goiter).   Light menstrual periods.   Frequent bowel movements.  Graves' disease can specifically cause eye and skin problems. The skin problems involve reddening and swelling of the skin, often on your shins and on the top of your feet. Eye problems can include the following:  Excess tearing and sensation of grit or sand in either or both eyes.   Reddened or inflamed eyes.   Widening of the space between your eyelids.   Swelling of the lids and tissues around the eyes.   Light sensitivity.   Ulcers on the cornea.   Double vision.   Limited eye movements.   Blurred or reduced vision.  Hypothyroidism- is when your thyroid gland is not active enough. This is more common than hyperthyroidism. Symptoms can vary a lot depending of the severity of the hormone deficiency. Symptoms may develop over a long period of time and can include several of the following:  Fatigue.   Sluggishness.   Increased sensitivity to cold.   Constipation.   Pale, dry skin.   A puffy face.   Hoarse voice.   High blood cholesterol level.   Unexplained weight gain.   Muscle aches, tenderness and stiffness.   Pain, stiffness or swelling in your joints.   Muscle weakness.   Heavier than normal menstrual periods.   Brittle fingernails and hair.   Depression.  Thyroid Nodules - most do not cause signs or symptoms. Occasionally, some may become so large that you can feel or even see the swelling at the base of your neck. You may realize a lump or swelling is there when you are shaving or putting on makeup. Men might become  aware of a nodule when shirt collars suddenly feel too tight. Some nodules produce too much thyroid hormone. This can produce the same symptoms as hyperthyroidism (see above). Thyroid nodules are seldom cancerous. However, a nodule is more likely to be malignant (cancerous) if it:  Grows quickly or feels hard.   Causes you to become hoarse or to have trouble swallowing or breathing.   Causes enlarged lymph nodes under your jaw or in your neck.  DIAGNOSIS  Because there are so many possible thyroid conditions, your caregiver may ask for a number of tests. They will do this in order to narrow down the exact diagnosis. These tests can include:  Blood and antibody tests.   Special thyroid scans using small, safe amounts of radioactive iodine.   Ultrasound of the thyroid gland (particularly if there is a nodule or lump).   Biopsy. This is usually done with a special needle. A needle biopsy is a procedure to obtain a sample of cells from the thyroid. The tissue will be tested in a lab and examined under a microscope.  TREATMENT  Treatment depends on the exact diagnosis. Hyperthyroidism  Beta-blockers help relieve many of the symptoms.   Anti-thyroid medications prevent the thyroid from making excess hormones.   Radioactive iodine treatment can destroy overactive thyroid   cells. The iodine can permanently decrease the amount of hormone produced.   Surgery to remove the thyroid gland.   Treatments for eye problems that come from Graves' disease also include medications and special eye surgery, if felt to be appropriate.  Hypothyroidism Thyroid replacement with levothyroxine is the mainstay of treatment. Treatment with thyroid replacement is usually lifelong and will require monitoring and adjustment from time to time. Thyroid Nodules  Watchful waiting. If a small nodule causes no symptoms or signs of cancer on biopsy, then no treatment may be chosen at first. Re-exam and re-checking blood  tests would be the recommended follow-up.   Anti-thyroid medications or radioactive iodine treatment may be recommended if the nodules produce too much thyroid hormone (see Treatment for Hyperthyroidism above).   Alcohol ablation. Injections of small amounts of ethyl alcohol (ethanol) can cause a non-cancerous nodule to shrink in size.   Surgery (see Treatment for Hyperthyroidism above).  HOME CARE INSTRUCTIONS   Take medications as instructed.   Follow through on recommended testing.  SEEK MEDICAL CARE IF:   You feel that you are developing symptoms of Hyperthyroidism or Hypothyroidism as described above.   You develop a new lump/nodule in the neck/thyroid area that you had not noticed before.   You feel that you are having side effects from medicines prescribed.   You develop trouble breathing or swallowing.  SEEK IMMEDIATE MEDICAL CARE IF:   You develop a fever of 102 F (38.9 C) or higher.   You develop severe sweating.   You develop palpitations and/or rapid heart beat.   You develop shortness of breath.   You develop nausea and vomiting.   You develop extreme shakiness.   You develop agitation.   You develop lightheadedness or have a fainting episode.  Document Released: 11/09/2006 Document Revised: 01/01/2011 Document Reviewed: 11/09/2006 [employer] Patient Information 2012 [employer]. 

## 2011-10-02 ENCOUNTER — Ambulatory Visit (HOSPITAL_BASED_OUTPATIENT_CLINIC_OR_DEPARTMENT_OTHER)
Admission: RE | Admit: 2011-10-02 | Discharge: 2011-10-02 | Disposition: A | Discharge status: Home / Self Care | Payer: BC Managed Care – PPO | Source: Ambulatory Visit | Attending: Family Medicine | Admitting: Family Medicine

## 2011-10-02 ENCOUNTER — Ambulatory Visit: Payer: PRIVATE HEALTH INSURANCE | Admitting: Family Medicine

## 2011-10-02 ENCOUNTER — Encounter: Payer: Self-pay | Admitting: *Deleted

## 2011-10-02 DIAGNOSIS — E041 Nontoxic single thyroid nodule: Secondary | ICD-10-CM

## 2011-10-02 DIAGNOSIS — E039 Hypothyroidism, unspecified: Secondary | ICD-10-CM | POA: Insufficient documentation

## 2011-10-02 DIAGNOSIS — F419 Anxiety disorder, unspecified: Secondary | ICD-10-CM | POA: Insufficient documentation

## 2011-10-02 LAB — T3, FREE: T3, Free: 3.4 pg/mL (ref 2.3–4.2)

## 2011-10-02 LAB — T4, FREE: Free T4: 0.86 ng/dL (ref 0.60–1.60)

## 2011-10-02 LAB — TSH: TSH: 0.3 u[IU]/mL — ABNORMAL LOW (ref 0.35–5.50)

## 2011-10-02 NOTE — Assessment & Plan Note (Signed)
Improved with dropping out of class Xanax prn rto if symptoms worsen

## 2011-10-02 NOTE — Assessment & Plan Note (Addendum)
Resolved---may be due to stress and anxiety con't to monitor
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
## 2011-10-02 NOTE — Assessment & Plan Note (Signed)
Check labs Us thyroid 

## 2011-10-02 NOTE — Addendum Note (Signed)
Addended by: Derry Lory A on: 10/02/2011 05:15 PM   Modules accepted: Orders

## 2011-10-05 ENCOUNTER — Other Ambulatory Visit (HOSPITAL_BASED_OUTPATIENT_CLINIC_OR_DEPARTMENT_OTHER): Payer: BC Managed Care – PPO

## 2011-10-05 ENCOUNTER — Telehealth: Payer: Self-pay | Admitting: Family Medicine

## 2011-10-05 ENCOUNTER — Other Ambulatory Visit: Payer: Self-pay | Admitting: Family Medicine

## 2011-10-05 DIAGNOSIS — E039 Hypothyroidism, unspecified: Secondary | ICD-10-CM

## 2011-10-05 NOTE — Telephone Encounter (Signed)
Referral put in.

## 2011-10-05 NOTE — Telephone Encounter (Signed)
Patient called to check on status of referral to Dr. Talmage Nap. According to the referral in the chart, pt is being referred to ENT, but pt believes she is being referred to endocrinology. Which one is correct? Patient would like a call back.

## 2011-10-05 NOTE — Telephone Encounter (Signed)
Patient is scheduled to be seen by Dr. Pollyann Kennedy at [facility] for Thyroid Nodule & she is aware of the appointment.  However, patient still requesting that a new referral be entered so she can re-establish with Dr. Talmage Nap, whom see saw years ago.  Will you please enter referral?

## 2011-10-06 ENCOUNTER — Telehealth: Payer: Self-pay | Admitting: Family Medicine

## 2011-10-06 NOTE — Telephone Encounter (Signed)
Spoke with patient and offered her and apt for tomorrow and she declined, she hs something had appointments and requested to have an apt on thursday. Will call if having symptoms. Apt scheduled     KP

## 2011-10-06 NOTE — Telephone Encounter (Signed)
Left message to call office

## 2011-10-06 NOTE — Telephone Encounter (Signed)
She needs to come in with her bp cuff

## 2011-10-06 NOTE — Telephone Encounter (Signed)
Pt called stated her BP 160/101, offered APPT 345pm pt refused, wants to know what she needs to do please call at [phone]

## 2011-10-08 ENCOUNTER — Encounter: Payer: Self-pay | Admitting: Family Medicine

## 2011-10-08 ENCOUNTER — Ambulatory Visit (INDEPENDENT_AMBULATORY_CARE_PROVIDER_SITE_OTHER): Payer: BC Managed Care – PPO | Admitting: Family Medicine

## 2011-10-08 VITALS — BP 158/93 | HR 78 | Temp 98.3°F | Wt 160.2 lb

## 2011-10-08 DIAGNOSIS — F411 Generalized anxiety disorder: Secondary | ICD-10-CM

## 2011-10-08 DIAGNOSIS — E041 Nontoxic single thyroid nodule: Secondary | ICD-10-CM

## 2011-10-08 DIAGNOSIS — F419 Anxiety disorder, unspecified: Secondary | ICD-10-CM

## 2011-10-08 DIAGNOSIS — I10 Essential (primary) hypertension: Secondary | ICD-10-CM

## 2011-10-08 DIAGNOSIS — R030 Elevated blood-pressure reading, without diagnosis of hypertension: Secondary | ICD-10-CM

## 2011-10-08 DIAGNOSIS — IMO0001 Reserved for inherently not codable concepts without codable children: Secondary | ICD-10-CM

## 2011-10-08 DIAGNOSIS — E039 Hypothyroidism, unspecified: Secondary | ICD-10-CM

## 2011-10-09 ENCOUNTER — Ambulatory Visit: Payer: PRIVATE HEALTH INSURANCE | Admitting: Family Medicine

## 2011-10-09 ENCOUNTER — Encounter: Payer: Self-pay | Admitting: Family Medicine

## 2011-10-09 DIAGNOSIS — E039 Hypothyroidism, unspecified: Secondary | ICD-10-CM | POA: Insufficient documentation

## 2011-10-09 DIAGNOSIS — E063 Autoimmune thyroiditis: Secondary | ICD-10-CM | POA: Insufficient documentation

## 2011-10-09 MED ORDER — THYROID 60 MG PO TABS: ORAL_TABLET | ORAL | Status: DC

## 2011-10-09 NOTE — Assessment & Plan Note (Signed)
To see endo tomorrow [place] neg for thyroid nodule-- but visible on exam

## 2011-10-09 NOTE — Progress Notes (Signed)
  Subjective:    Patient ID: Emily Barker, female    DOB: [birth year], 36 y.o.   MRN: [id number]  HPI Pt here f/u bp.  Its been running high at home and she has had a ha but she is also having many more panic attacks.  Her endo appointment is tomorrow and she has appointment with psych today.   She has been on the 1/2 tab amour thyroid for a week only.  She has had cp associated with her panic attacks.  No sob.    Review of Systems As above    Objective:   Physical Exam  Constitutional: She is oriented to person, place, and time. She appears well-developed and well-nourished.  Neck: Normal range of motion. Neck supple.  Cardiovascular: Normal rate and regular rhythm.   Pulmonary/Chest: Effort normal and breath sounds normal. No respiratory distress. She has no wheezes. She has no rales.  Neurological: She is alert and oriented to person, place, and time.  Psychiatric: Her behavior is normal. Judgment and thought content normal.       Pt very anxious, started crying in exam room She is very concerned about her bp and thyroid She is not suicidal          Assessment & Plan:

## 2011-10-09 NOTE — Patient Instructions (Signed)

## 2011-10-09 NOTE — Assessment & Plan Note (Signed)
tsh low Armour thyroid was decreased a week ago Endo appointment tomorrow

## 2011-10-09 NOTE — Assessment & Plan Note (Signed)
con't sanax pristiq 50 mg qd  F/u psych

## 2011-10-09 NOTE — Assessment & Plan Note (Signed)
Labile On no meds at this time Get thyroid and anxiety under control, then reassess

## 2011-12-21 ENCOUNTER — Emergency Department (HOSPITAL_COMMUNITY): Payer: BC Managed Care – PPO

## 2011-12-21 ENCOUNTER — Encounter (HOSPITAL_COMMUNITY): Payer: Self-pay | Admitting: Emergency Medicine

## 2011-12-21 ENCOUNTER — Emergency Department (HOSPITAL_COMMUNITY)
Admission: EM | Admit: 2011-12-21 | Discharge: 2011-12-22 | Disposition: A | Discharge status: Home / Self Care | Payer: BC Managed Care – PPO | Attending: Emergency Medicine | Admitting: Emergency Medicine

## 2011-12-21 DIAGNOSIS — D1802 Hemangioma of intracranial structures: Secondary | ICD-10-CM

## 2011-12-21 DIAGNOSIS — Z8719 Personal history of other diseases of the digestive system: Secondary | ICD-10-CM | POA: Insufficient documentation

## 2011-12-21 DIAGNOSIS — H9319 Tinnitus, unspecified ear: Secondary | ICD-10-CM | POA: Insufficient documentation

## 2011-12-21 DIAGNOSIS — R42 Dizziness and giddiness: Secondary | ICD-10-CM | POA: Insufficient documentation

## 2011-12-21 DIAGNOSIS — Z3202 Encounter for pregnancy test, result negative: Secondary | ICD-10-CM | POA: Insufficient documentation

## 2011-12-21 DIAGNOSIS — Z8619 Personal history of other infectious and parasitic diseases: Secondary | ICD-10-CM | POA: Insufficient documentation

## 2011-12-21 DIAGNOSIS — Z8679 Personal history of other diseases of the circulatory system: Secondary | ICD-10-CM | POA: Insufficient documentation

## 2011-12-21 DIAGNOSIS — Z79899 Other long term (current) drug therapy: Secondary | ICD-10-CM | POA: Insufficient documentation

## 2011-12-21 DIAGNOSIS — E039 Hypothyroidism, unspecified: Secondary | ICD-10-CM | POA: Insufficient documentation

## 2011-12-21 DIAGNOSIS — I10 Essential (primary) hypertension: Secondary | ICD-10-CM | POA: Insufficient documentation

## 2011-12-21 LAB — URINE MICROSCOPIC-ADD ON

## 2011-12-21 LAB — URINALYSIS, ROUTINE W REFLEX MICROSCOPIC
Bilirubin Urine: NEGATIVE
Glucose, UA: NEGATIVE mg/dL
Nitrite: NEGATIVE
Protein, ur: NEGATIVE mg/dL
Specific Gravity, Urine: 1.014 (ref 1.005–1.030)
Urobilinogen, UA: 0.2 mg/dL (ref 0.0–1.0)
pH: 7 (ref 5.0–8.0)

## 2011-12-21 LAB — COMPREHENSIVE METABOLIC PANEL
ALT: 7 U/L (ref 0–35)
AST: 13 U/L (ref 0–37)
Albumin: 3.9 g/dL (ref 3.5–5.2)
Alkaline Phosphatase: 68 U/L (ref 39–117)
BUN: 13 mg/dL (ref 6–23)
CO2: 22 mEq/L (ref 19–32)
Calcium: 8.8 mg/dL (ref 8.4–10.5)
Chloride: 101 mEq/L (ref 96–112)
Creatinine, Ser: 0.68 mg/dL (ref 0.50–1.10)
GFR calc Af Amer: >90 mL/min (ref >90)
GFR calc non Af Amer: >90 mL/min (ref >90)
Glucose, Bld: 146 mg/dL — ABNORMAL HIGH (ref 70–99)
Potassium: 3.9 mEq/L (ref 3.5–5.1)
Sodium: 137 mEq/L (ref 135–145)
Total Bilirubin: 0.2 mg/dL — ABNORMAL LOW (ref 0.3–1.2)
Total Protein: 7.2 g/dL (ref 6.0–8.3)

## 2011-12-21 LAB — CBC WITH DIFFERENTIAL/PLATELET
Basophils Absolute: 0 10*3/uL (ref 0.0–0.1)
Basophils Relative: 0 % (ref 0–1)
Eosinophils Absolute: 0 10*3/uL (ref 0.0–0.7)
Eosinophils Relative: 0 % (ref 0–5)
HCT: 35.1 % — ABNORMAL LOW (ref 36.0–46.0)
Hemoglobin: 11.9 g/dL — ABNORMAL LOW (ref 12.0–15.0)
Lymphocytes Relative: 10 % — ABNORMAL LOW (ref 12–46)
Lymphs Abs: 1.3 10*3/uL (ref 0.7–4.0)
MCH: 26.5 pg (ref 26.0–34.0)
MCHC: 33.9 g/dL (ref 30.0–36.0)
MCV: 78.2 fL (ref 78.0–100.0)
Monocytes Absolute: 0.6 10*3/uL (ref 0.1–1.0)
Monocytes Relative: 4 % (ref 3–12)
Neutro Abs: 11.6 10*3/uL — ABNORMAL HIGH (ref 1.7–7.7)
Neutrophils Relative %: 86 % — ABNORMAL HIGH (ref 43–77)
Platelets: 178 10*3/uL (ref 150–400)
RBC: 4.49 MIL/uL (ref 3.87–5.11)
RDW: 13.8 % (ref 11.5–15.5)
WBC: 13.5 10*3/uL — ABNORMAL HIGH (ref 4.0–10.5)

## 2011-12-21 LAB — POCT PREGNANCY, URINE: Preg Test, Ur: NEGATIVE

## 2011-12-21 LAB — LIPASE, BLOOD: Lipase: 26 U/L (ref 11–59)

## 2011-12-21 MED ORDER — ONDANSETRON HCL 4 MG/2ML IJ SOLN
4.0000 mg | Freq: Once | INTRAMUSCULAR | Status: AC
  Administered 2011-12-21: 4 mg via INTRAVENOUS
  Filled 2011-12-21: qty 2

## 2011-12-21 MED ORDER — MECLIZINE HCL 25 MG PO TABS
25.0000 mg | ORAL_TABLET | Freq: Once | ORAL | Status: AC
  Administered 2011-12-21: 25 mg via ORAL
  Filled 2011-12-21: qty 1

## 2011-12-21 MED ORDER — GADOBENATE DIMEGLUMINE 529 MG/ML IV SOLN
15.0000 mL | Freq: Once | INTRAVENOUS | Status: AC | PRN
  Administered 2011-12-21: 15 mL via INTRAVENOUS

## 2011-12-21 MED ORDER — SODIUM CHLORIDE 0.9 % IV BOLUS (SEPSIS)
1000.0000 mL | Freq: Once | INTRAVENOUS | Status: AC
  Administered 2011-12-21: 1000 mL via INTRAVENOUS

## 2011-12-21 MED ORDER — LORAZEPAM 2 MG/ML IJ SOLN
1.0000 mg | Freq: Once | INTRAMUSCULAR | Status: AC
  Administered 2011-12-21: 1 mg via INTRAVENOUS
  Filled 2011-12-21: qty 1

## 2011-12-21 MED ORDER — DIAZEPAM 5 MG/ML IJ SOLN
5.0000 mg | Freq: Once | INTRAMUSCULAR | Status: AC
  Administered 2011-12-21: 5 mg via INTRAVENOUS
  Filled 2011-12-21: qty 2

## 2011-12-21 MED ORDER — PROMETHAZINE HCL 25 MG/ML IJ SOLN
12.5000 mg | Freq: Once | INTRAMUSCULAR | Status: AC
  Administered 2011-12-21: 12.5 mg via INTRAVENOUS
  Filled 2011-12-21: qty 1

## 2011-12-21 NOTE — ED Provider Notes (Signed)
  Physical Exam  BP 151/94  Pulse 93  Temp 98.6 F (37 C) (Oral)  SpO2 100%  LMP 11/20/2011  Physical Exam Abnormal CT Scan will obtain MRI brain  ED Course  Procedures  MDM After reviewing the MRI consulting with Dr. Silverio Lay, and Dr. Dutch Quint, neurosurgeon, who recommends that there is nothing acute to be done at this point, that the patient.  May followup in the office.  He, feels that this is more likely, an inner ear problem.  I will attempt to control her symptoms of dizziness and nausea and proceed from there.  I did discuss this with the patient and her husband Patient was given 5 mg of Valium IV.  She has been sleeping soundly since that time.  When awakened, and gently set up.  She states, that she is still mildly dizzy, but improved.  We'll discharge home with prescription for Valium, and Zofran with ENT followup     Arman Filter, NP 12/21/11 2001  Arman Filter, NP 12/23/11 [phone]

## 2011-12-21 NOTE — ED Provider Notes (Signed)
History     CSN: [id number]  Arrival date & time 12/21/11  1701   First MD Initiated Contact with Patient 12/21/11 1725      Chief Complaint  Patient presents with  . Emesis  . Nausea    (Consider location/radiation/quality/duration/timing/severity/associated sxs/prior treatment) Patient is a 36 y.o. female presenting with vomiting. The history is provided by the patient.  Emesis  This is a new problem. The current episode started 3 to 5 hours ago. There has been no fever. Pertinent negatives include no abdominal pain, no chills, no fever and no headaches. 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 No head injury, headache. She has no history of similar symptoms in the past. .    Past Medical History  Diagnosis Date  . Migraines   . GERD (gastroesophageal reflux disease)   . Heart murmur     as a child  . Hepatitis A   . Hypertension   . Hypothyroid     Past Surgical History  Procedure Date  . Tonsillectomy and adenoidectomy 1988  . Cesarean section     Family History  Problem Relation Age of Onset  . Prostate cancer Father   . Hypertension Father   . Alcohol abuse Father   . Heart disease Father   . Hyperlipidemia Mother   . Hypertension Brother   . Alcohol abuse Paternal Grandfather   . Heart disease Paternal Grandfather   . Breast cancer Maternal Aunt   . Depression Maternal Aunt   . Depression Maternal Uncle   . Heart disease Paternal Aunt   . Dementia Maternal Grandmother   . Heart disease Maternal Grandfather   . Heart disease Paternal Grandmother   . Depression Maternal Aunt     History  Substance Use Topics  . Smoking status: Never Smoker   . Smokeless tobacco: Never Used  . Alcohol Use: Yes    OB History    Grav Para Term Preterm Abortions TAB SAB Ect Mult Living                  Review of Systems  Constitutional: Negative for  fever and chills.  HENT: Positive for tinnitus. Negative for neck pain.   Eyes: Negative for visual disturbance.  Respiratory: Negative.  Negative for shortness of breath.   Cardiovascular: Negative.  Negative for chest pain.  Gastrointestinal: Positive for nausea and vomiting. Negative for abdominal pain.  Genitourinary: Negative.  Negative for dysuria and vaginal bleeding.  Musculoskeletal: Negative.   Neurological: Positive for dizziness. Negative for headaches.  Psychiatric/Behavioral: Negative for confusion.    Allergies  Penicillins  Home Medications   Current Outpatient Rx  Name  Route  Sig  Dispense  Refill  . ALPRAZOLAM 0.25 MG PO TABS   Oral   Take 0.5 tablets (0.125 mg total) by mouth 3 (three) times daily as needed. Anxiety. Take 1/2 tablet if needed for anxiety.   30 tablet   1   . LEVOTHYROXINE SODIUM 50 MCG PO TABS   Oral   Take 50 mcg by mouth daily.           SpO2 100%  Physical Exam  Constitutional: She is oriented to person, place, and time. She appears well-developed and well-nourished.  HENT:  Head: Normocephalic.  Eyes: Pupils are equal, round, and reactive to light.  Neck: Normal range of motion. Neck supple.  Cardiovascular: Normal rate and regular rhythm.  Pulmonary/Chest: Effort normal and breath sounds normal.  Abdominal: Soft. Bowel sounds are normal. There is no tenderness. There is no rebound and no guarding.  Musculoskeletal: Normal range of motion.  Neurological: She is alert and oriented to person, place, and time. She has normal strength and normal reflexes. No sensory deficit. She displays a negative Romberg sign. Coordination normal.  Skin: Skin is warm and dry. No rash noted.  Psychiatric: She has a normal mood and affect.    ED Course  Procedures (including critical care time)   Labs Reviewed  POCT PREGNANCY, URINE  CBC WITH DIFFERENTIAL  COMPREHENSIVE METABOLIC PANEL  LIPASE, BLOOD  URINALYSIS, ROUTINE W REFLEX  MICROSCOPIC   Results for orders placed during the hospital encounter of 12/21/11  CBC WITH DIFFERENTIAL      Component Value Range   WBC 13.5 (*) 4.0 - 10.5 K/uL   RBC 4.49  3.87 - 5.11 MIL/uL   Hemoglobin 11.9 (*) 12.0 - 15.0 g/dL   HCT 40.9 (*) 81.1 - 91.4 %   MCV 78.2  78.0 - 100.0 fL   MCH 26.5  26.0 - 34.0 pg   MCHC 33.9  30.0 - 36.0 g/dL   RDW 78.2  95.6 - 21.3 %   Platelets 178  150 - 400 K/uL   Neutrophils Relative 86 (*) 43 - 77 %   Neutro Abs 11.6 (*) 1.7 - 7.7 K/uL   Lymphocytes Relative 10 (*) 12 - 46 %   Lymphs Abs 1.3  0.7 - 4.0 K/uL   Monocytes Relative 4  3 - 12 %   Monocytes Absolute 0.6  0.1 - 1.0 K/uL   Eosinophils Relative 0  0 - 5 %   Eosinophils Absolute 0.0  0.0 - 0.7 K/uL   Basophils Relative 0  0 - 1 %   Basophils Absolute 0.0  0.0 - 0.1 K/uL  COMPREHENSIVE METABOLIC PANEL      Component Value Range   Sodium 137  135 - 145 mEq/L   Potassium 3.9  3.5 - 5.1 mEq/L   Chloride 101  96 - 112 mEq/L   CO2 22  19 - 32 mEq/L   Glucose, Bld 146 (*) 70 - 99 mg/dL   BUN 13  6 - 23 mg/dL   Creatinine, Ser 0.86  0.50 - 1.10 mg/dL   Calcium 8.8  8.4 - 57.8 mg/dL   Total Protein 7.2  6.0 - 8.3 g/dL   Albumin 3.9  3.5 - 5.2 g/dL   AST 13  0 - 37 U/L   ALT 7  0 - 35 U/L   Alkaline Phosphatase 68  39 - 117 U/L   Total Bilirubin 0.2 (*) 0.3 - 1.2 mg/dL   GFR calc non Af Amer >90  >90 mL/min   GFR calc Af Amer >90  >90 mL/min  LIPASE, BLOOD      Component Value Range   Lipase 26  11 - 59 U/L  URINALYSIS, ROUTINE W REFLEX MICROSCOPIC      Component Value Range   Color, Urine YELLOW  YELLOW   APPearance CLOUDY (*) CLEAR   Specific Gravity, Urine 1.014  1.005 - 1.030   pH 7.0  5.0 - 8.0   Glucose, UA NEGATIVE  NEGATIVE mg/dL   Hgb urine dipstick MODERATE (*) NEGATIVE   Bilirubin Urine NEGATIVE  NEGATIVE   Ketones, ur TRACE (*) NEGATIVE mg/dL   Protein, ur NEGATIVE  NEGATIVE mg/dL   Urobilinogen, UA 0.2  0.0 - 1.0 mg/dL  Nitrite NEGATIVE  NEGATIVE    Leukocytes, UA TRACE (*) NEGATIVE  POCT PREGNANCY, URINE      Component Value Range   Preg Test, Ur NEGATIVE  NEGATIVE  URINE MICROSCOPIC-ADD ON      Component Value Range   Squamous Epithelial / LPF FEW (*) RARE   WBC, UA 0-2  <3 WBC/hpf   RBC / HPF 3-6  <3 RBC/hpf   Bacteria, UA MANY (*) RARE    No results found.   No diagnosis found.    MDM  Meclizine given with little improvement in dizziness. Zofran and Phenergan with significant improvement in nausea. No vomiting here. CT Head pending, labs stable. Discussed with Dr. Silverio Lay.        Rodena Medin, PA-C 12/21/11 1950

## 2011-12-21 NOTE — ED Notes (Addendum)
Per EMS: pt c/o of n/v/dizziness. Sudden onset. 4 of Zofran, 20 right forearm. Per pt: symptoms just started today. Actively vomiting and complaining of dizziness.

## 2011-12-21 NOTE — ED Notes (Signed)
TDD:[id number]<RK> Expected date:<BR> Expected time:<BR> Means of arrival:<BR> Comments:<BR> Dizziness, IV est, zofran

## 2011-12-21 NOTE — ED Notes (Signed)
Patient transported to MRI 

## 2011-12-21 NOTE — ED Provider Notes (Signed)
Medical screening examination/treatment/procedure(s) were performed by non-physician practitioner and as supervising physician I was immediately available for consultation/collaboration.   Richardean Canal, MD 12/21/11 [phone]

## 2011-12-22 MED ORDER — SODIUM CHLORIDE 0.9 % IV BOLUS (SEPSIS)
1000.0000 mL | Freq: Once | INTRAVENOUS | Status: AC
  Administered 2011-12-22: 1000 mL via INTRAVENOUS

## 2011-12-22 MED ORDER — DIAZEPAM 5 MG PO TABS: 5.0000 mg | ORAL_TABLET | Freq: Four times a day (QID) | ORAL | Status: DC | PRN

## 2011-12-22 MED ORDER — ONDANSETRON HCL 8 MG PO TABS: 4.0000 mg | ORAL_TABLET | Freq: Three times a day (TID) | ORAL | Status: DC | PRN

## 2011-12-23 ENCOUNTER — Ambulatory Visit (HOSPITAL_BASED_OUTPATIENT_CLINIC_OR_DEPARTMENT_OTHER)
Admission: RE | Admit: 2011-12-23 | Discharge: 2011-12-23 | Disposition: A | Discharge status: Home / Self Care | Payer: BC Managed Care – PPO | Source: Ambulatory Visit | Attending: Family Medicine | Admitting: Family Medicine

## 2011-12-23 ENCOUNTER — Emergency Department (HOSPITAL_COMMUNITY)
Admission: EM | Admit: 2011-12-23 | Discharge: 2011-12-23 | Disposition: A | Discharge status: Home / Self Care | Payer: BC Managed Care – PPO | Attending: Emergency Medicine | Admitting: Emergency Medicine

## 2011-12-23 ENCOUNTER — Other Ambulatory Visit: Payer: Self-pay

## 2011-12-23 ENCOUNTER — Other Ambulatory Visit: Payer: Self-pay | Admitting: *Deleted

## 2011-12-23 ENCOUNTER — Telehealth: Payer: Self-pay | Admitting: Family Medicine

## 2011-12-23 ENCOUNTER — Ambulatory Visit (INDEPENDENT_AMBULATORY_CARE_PROVIDER_SITE_OTHER): Payer: BC Managed Care – PPO | Admitting: Family Medicine

## 2011-12-23 ENCOUNTER — Encounter (HOSPITAL_COMMUNITY): Payer: Self-pay | Admitting: Emergency Medicine

## 2011-12-23 ENCOUNTER — Emergency Department (HOSPITAL_COMMUNITY): Payer: BC Managed Care – PPO

## 2011-12-23 ENCOUNTER — Encounter: Payer: Self-pay | Admitting: Family Medicine

## 2011-12-23 VITALS — BP 148/80 | HR 98 | Temp 97.4°F

## 2011-12-23 DIAGNOSIS — K838 Other specified diseases of biliary tract: Secondary | ICD-10-CM | POA: Insufficient documentation

## 2011-12-23 DIAGNOSIS — R109 Unspecified abdominal pain: Secondary | ICD-10-CM

## 2011-12-23 DIAGNOSIS — R42 Dizziness and giddiness: Secondary | ICD-10-CM

## 2011-12-23 DIAGNOSIS — R319 Hematuria, unspecified: Secondary | ICD-10-CM

## 2011-12-23 DIAGNOSIS — M549 Dorsalgia, unspecified: Secondary | ICD-10-CM

## 2011-12-23 DIAGNOSIS — R1011 Right upper quadrant pain: Secondary | ICD-10-CM | POA: Insufficient documentation

## 2011-12-23 DIAGNOSIS — M545 Low back pain, unspecified: Secondary | ICD-10-CM | POA: Insufficient documentation

## 2011-12-23 DIAGNOSIS — K5730 Diverticulosis of large intestine without perforation or abscess without bleeding: Secondary | ICD-10-CM | POA: Insufficient documentation

## 2011-12-23 DIAGNOSIS — R112 Nausea with vomiting, unspecified: Secondary | ICD-10-CM

## 2011-12-23 DIAGNOSIS — R110 Nausea: Secondary | ICD-10-CM | POA: Insufficient documentation

## 2011-12-23 DIAGNOSIS — I10 Essential (primary) hypertension: Secondary | ICD-10-CM | POA: Insufficient documentation

## 2011-12-23 DIAGNOSIS — K219 Gastro-esophageal reflux disease without esophagitis: Secondary | ICD-10-CM | POA: Insufficient documentation

## 2011-12-23 DIAGNOSIS — E039 Hypothyroidism, unspecified: Secondary | ICD-10-CM | POA: Insufficient documentation

## 2011-12-23 DIAGNOSIS — K828 Other specified diseases of gallbladder: Secondary | ICD-10-CM

## 2011-12-23 DIAGNOSIS — N854 Malposition of uterus: Secondary | ICD-10-CM | POA: Insufficient documentation

## 2011-12-23 DIAGNOSIS — B159 Hepatitis A without hepatic coma: Secondary | ICD-10-CM | POA: Insufficient documentation

## 2011-12-23 HISTORY — DX: Other complications of anesthesia, initial encounter: T88.59XA

## 2011-12-23 HISTORY — DX: Adverse effect of unspecified anesthetic, initial encounter: T41.45XA

## 2011-12-23 LAB — BASIC METABOLIC PANEL
BUN: 13 mg/dL (ref 6–23)
CO2: 25 mEq/L (ref 19–32)
Calcium: 9.2 mg/dL (ref 8.4–10.5)
Chloride: 104 mEq/L (ref 96–112)
Creatinine, Ser: 0.7 mg/dL (ref 0.4–1.2)
GFR: 93.15 mL/min (ref >60.00)
Glucose, Bld: 123 mg/dL — ABNORMAL HIGH (ref 70–99)
Potassium: 3.2 mEq/L — ABNORMAL LOW (ref 3.5–5.1)
Sodium: 137 mEq/L (ref 135–145)

## 2011-12-23 LAB — CBC WITH DIFFERENTIAL/PLATELET
Basophils Absolute: 0 10*3/uL (ref 0.0–0.1)
Basophils Relative: 0.3 % (ref 0.0–3.0)
Eosinophils Absolute: 0 10*3/uL (ref 0.0–0.7)
Eosinophils Relative: 0 % (ref 0.0–5.0)
HCT: 38.3 % (ref 36.0–46.0)
Hemoglobin: 12.5 g/dL (ref 12.0–15.0)
Lymphocytes Relative: 18.6 % (ref 12.0–46.0)
Lymphs Abs: 1.2 10*3/uL (ref 0.7–4.0)
MCHC: 32.7 g/dL (ref 30.0–36.0)
MCV: 79.3 fl (ref 78.0–100.0)
Monocytes Absolute: 0.4 10*3/uL (ref 0.1–1.0)
Monocytes Relative: 6.3 % (ref 3.0–12.0)
Neutro Abs: 4.7 10*3/uL (ref 1.4–7.7)
Neutrophils Relative %: 74.8 % (ref 43.0–77.0)
Platelets: 200 10*3/uL (ref 150.0–400.0)
RBC: 4.83 Mil/uL (ref 3.87–5.11)
RDW: 14.6 % (ref 11.5–14.6)
WBC: 6.3 10*3/uL (ref 4.5–10.5)

## 2011-12-23 LAB — POCT URINALYSIS DIPSTICK
Bilirubin, UA: NEGATIVE
Glucose, UA: NEGATIVE
Nitrite, UA: NEGATIVE
Protein, UA: NEGATIVE
Spec Grav, UA: 1.015
Urobilinogen, UA: 0.2
pH, UA: 6

## 2011-12-23 LAB — HEPATIC FUNCTION PANEL
ALT: 8 U/L (ref 0–35)
AST: 16 U/L (ref 0–37)
Albumin: 4.1 g/dL (ref 3.5–5.2)
Alkaline Phosphatase: 61 U/L (ref 39–117)
Bilirubin, Direct: 0.1 mg/dL (ref 0.0–0.3)
Total Bilirubin: 0.6 mg/dL (ref 0.3–1.2)
Total Protein: 7.4 g/dL (ref 6.0–8.3)

## 2011-12-23 LAB — TSH: TSH: 3.6 u[IU]/mL (ref 0.35–5.50)

## 2011-12-23 MED ORDER — INFLUENZA VIRUS VACC SPLIT PF IM SUSP: 0.5000 mL | INTRAMUSCULAR | Status: DC

## 2011-12-23 MED ORDER — MECLIZINE HCL 25 MG PO TABS: 25.0000 mg | ORAL_TABLET | Freq: Three times a day (TID) | ORAL | Status: DC | PRN

## 2011-12-23 MED ORDER — CIPROFLOXACIN HCL 500 MG PO TABS: 500.0000 mg | ORAL_TABLET | Freq: Two times a day (BID) | ORAL | Status: DC

## 2011-12-23 MED ORDER — TRAMADOL HCL 50 MG PO TABS: 50.0000 mg | ORAL_TABLET | Freq: Three times a day (TID) | ORAL | Status: DC | PRN

## 2011-12-23 NOTE — ED Provider Notes (Signed)
History/physical exam/procedure(s) were performed by non-physician practitioner and as supervising physician I was immediately available for consultation/collaboration. I have reviewed all notes and am in agreement with care and plan.   Michael Ventresca S Robertt Buda, MD 12/23/11 2333 

## 2011-12-23 NOTE — Progress Notes (Signed)
  Subjective:    Patient ID: Emily Barker, female    DOB: 1975/12/10, 36 y.o.   MRN: [id number]  HPI Pt here c/o dizziness, nausea and vomiting for several days.  She stopped her thyroid meds because she could not hold anything down.      Review of Systems    as above Objective:   Physical Exam   BP 148/80  Pulse 98  Temp 97.4 F (36.3 C) (Oral)  SpO2 98%  LMP 11/20/2011 General appearance: alert, cooperative, appears stated age and moderate distress Ears: normal TM's and external ear canals both ears Nose: Nares normal. Septum midline. Mucosa normal. No drainage or sinus tenderness. Throat: lips, mucosa, and tongue normal; teeth and gums normal Neck: no adenopathy, supple, symmetrical, trachea midline and thyroid not enlarged, symmetric, no tenderness/mass/nodules Lungs: clear to auscultation bilaterally Abdomen: abnormal findings:  suprapubic tenderness,-----soft, no rebound   Assessment & Plan:  NV, dizziness----with elevated wbc and leukocytes and blood in urine                     Meclizine,  Repeat labs, check CT abd pelvis                    cipro 500 mg bid for 5 days                     To er if symptoms worsen.

## 2011-12-23 NOTE — Telephone Encounter (Signed)
In reference to my call to [facility], per Dr. Ernst Spell request, I spoke with nurse Britta Mccreedy in Triage.  Due to patient's CT results, current multiple symptoms, it is advised that patient report to [facility] for consult with the on call surgeon.  I have called spoken with patient's spouse, Mellody Dance, he is aware & agrees.

## 2011-12-23 NOTE — Consult Note (Signed)
Reason for Consult: dizziness, nausea, back pain Referring Physician: DrRay  Emily Barker is an 36 y.o. female.  HPI: this is the patient's second time to the emergency department in the past 2 days with extreme vertigo, nausea and vomiting. This is accompanied by upper abdominal pain and flank pain. A CT scan was performed on her previous admission which shows some biliary sludge. She presented again to her doctor's office this morning and a repeat set of labs were performed.  these were all normal. She was sent back to the emergency department where a repeat CT scan still shows no signs of abdominal distress. A right upper quadrant ultrasound was performed which was also negative for any pathology. I was asked to evaluate the patient for any reason that her gallbladder could be causing her nausea. She states that her symptoms began on Sunday and that it was mainly vertigo leading to nausea. She has noticed over the past month that she has had increasing upper abdominal pain with reflux. She does have a history of tinnitus in the past.  A reason for this could not never be found.  Past Medical History  Diagnosis Date  . Migraines   . GERD (gastroesophageal reflux disease)   . Heart murmur     as a child  . Hepatitis A   . Hypertension   . Hypothyroid   . Complication of anesthesia     bp dropped with epidural    Past Surgical History  Procedure Date  . Tonsillectomy and adenoidectomy 1988  . Cesarean section     Family History  Problem Relation Age of Onset  . Prostate cancer Father   . Hypertension Father   . Alcohol abuse Father   . Heart disease Father   . Hyperlipidemia Mother   . Hypertension Brother   . Alcohol abuse Paternal Grandfather   . Heart disease Paternal Grandfather   . Breast cancer Maternal Aunt   . Depression Maternal Aunt   . Depression Maternal Uncle   . Heart disease Paternal Aunt   . Dementia Maternal Grandmother   . Heart disease Maternal Grandfather    . Heart disease Paternal Grandmother   . Depression Maternal Aunt     Social History:  reports that she has never smoked. She has never used smokeless tobacco. She reports that she drinks alcohol. She reports that she does not use illicit drugs.  Allergies:  Allergies  Allergen Reactions  . Penicillins     Unknown. Other family members severely allergic. 1 family member had fatal reaction.     Medications: I have reviewed the patient's current medications.  Results for orders placed in visit on 12/23/11 (from the past 48 hour(s))  POCT URINALYSIS DIPSTICK     Status: Abnormal   Collection Time   12/23/11  8:57 AM      Component Value Range Comment   Color, UA Yellow      Clarity, UA Hazy      Glucose, UA Neg      Bilirubin, UA Neg      Ketones, UA Large      Spec Grav, UA 1.015      Blood, UA Large      pH, UA 6.0      Protein, UA Neg      Urobilinogen, UA 0.2      Nitrite, UA Neg      Leukocytes, UA small (1+)     CBC WITH DIFFERENTIAL     Status:  Normal   Collection Time   12/23/11  9:02 AM      Component Value Range Comment   WBC 6.3  4.5 - 10.5 K/uL    RBC 4.83  3.87 - 5.11 Mil/uL    Hemoglobin 12.5  12.0 - 15.0 g/dL    HCT 16.1  09.6 - 04.5 %    MCV 79.3  78.0 - 100.0 fl    MCHC 32.7  30.0 - 36.0 g/dL    RDW 40.9  81.1 - 91.4 %    Platelets 200.0  150.0 - 400.0 K/uL    Neutrophils Relative 74.8  43.0 - 77.0 %    Lymphocytes Relative 18.6  12.0 - 46.0 %    Monocytes Relative 6.3  3.0 - 12.0 %    Eosinophils Relative 0.0  0.0 - 5.0 %    Basophils Relative 0.3  0.0 - 3.0 %    Neutro Abs 4.7  1.4 - 7.7 K/uL    Lymphs Abs 1.2  0.7 - 4.0 K/uL    Monocytes Absolute 0.4  0.1 - 1.0 K/uL    Eosinophils Absolute 0.0  0.0 - 0.7 K/uL    Basophils Absolute 0.0  0.0 - 0.1 K/uL   BASIC METABOLIC PANEL     Status: Abnormal   Collection Time   12/23/11  9:02 AM      Component Value Range Comment   Sodium 137  135 - 145 mEq/L    Potassium 3.2 (*) 3.5 - 5.1 mEq/L     Chloride 104  96 - 112 mEq/L    CO2 25  19 - 32 mEq/L    Glucose, Bld 123 (*) 70 - 99 mg/dL    BUN 13  6 - 23 mg/dL    Creatinine, Ser 0.7  0.4 - 1.2 mg/dL    Calcium 9.2  8.4 - 78.2 mg/dL    GFR 95.62  >13.08 mL/min   HEPATIC FUNCTION PANEL     Status: Normal   Collection Time   12/23/11  9:02 AM      Component Value Range Comment   Total Bilirubin 0.6  0.3 - 1.2 mg/dL    Bilirubin, Direct 0.1  0.0 - 0.3 mg/dL    Alkaline Phosphatase 61  39 - 117 U/L    AST 16  0 - 37 U/L    ALT 8  0 - 35 U/L    Total Protein 7.4  6.0 - 8.3 g/dL    Albumin 4.1  3.5 - 5.2 g/dL   TSH     Status: Normal   Collection Time   12/23/11  9:02 AM      Component Value Range Comment   TSH 3.60  0.35 - 5.50 uIU/mL     Ct Abdomen Pelvis Wo Contrast  12/23/2011  *RADIOLOGY REPORT*  Clinical Data: Low back pain radiating to the front.  Nausea and vomiting.  Vertigo.  CT ABDOMEN AND PELVIS WITHOUT CONTRAST  Technique:  Multidetector CT imaging of the abdomen and pelvis was performed following the standard protocol without intravenous contrast.  Comparison: 02/18/2011.  Findings: Lung Bases: Normal.  Liver:  Unenhanced CT was performed per clinician order.  Lack of IV contrast limits sensitivity and specificity, especially for evaluation of abdominal/pelvic solid viscera.  Normal.  Spleen:  Normal.  Gallbladder:  Dependently layering biliary sludge.  No noncontrast evidence of cholecystitis.  Common bile duct:  Normal.  Pancreas:  Normal.  Adrenal glands:  Normal.  Kidneys:  Normal.  No  calculi or hydronephrosis.  Stomach:  Normal.  Small bowel:  Normal.  Colon:   Small appendicolith without appendiceal inflammatory changes.  Sigmoid diverticulitis has resolved.  Sigmoid diverticulosis is noted.  No colonic inflammatory changes.  Pelvic Genitourinary:  Retroverted retroflexed uterus.  Physiologic appearance of the uterus and adnexa.  No free fluid.  Urinary bladder appears normal.  Bones:  No aggressive osseous lesions.   Benign bone island adjacent to the left acetabulum.  Mild lumbar facet arthrosis, most pronounced at L4-L5 on the left.  Vasculature: Normal.  IMPRESSION: 1.  No acute abnormality. 2.  Negative for renal calculi. 3.  Biliary sludge. 4.  Resolution of sigmoid diverticulitis.   Original Report Authenticated By: Andreas Newport, M.D.    Ct Head Wo Contrast  12/21/2011  *RADIOLOGY REPORT*  Clinical Data: Extreme nausea.  CT HEAD WITHOUT CONTRAST  Technique:  Contiguous axial images were obtained from the base of the skull through the vertex without contrast.  Comparison: None.  Findings: 1.1 cm oval area of mildly increased density in the periphery of the left cerebral hemisphere in the posterior left frontal lobe, best seen on image number 16.  There is some asymmetry of the white matter low density in both cerebral hemispheres which appears to be due to mild tilting of the patient's head within the gantry.  No definite true asymmetry is identified.  The ventricles are normal in size and position.  No posterior fossa abnormalities are visualized.  Unremarkable bones and included paranasal sinuses.  IMPRESSION: 1.1 cm oval area of mildly increased density in the periphery of the posterior aspect of the left frontal lobe.  This could represent a small area of mild cortical hemorrhage associated with a small mass or area of infarction.  This could also represent increased density associate with a mass without hemorrhage.  This would be an atypical appearance for focal infection.  Further evaluation with pre and postcontrast magnetic resonance imaging of the head is recommended.   Original Report Authenticated By: Beckie Salts, M.D.    Mr Laqueta Jean Wo Contrast  12/21/2011  *RADIOLOGY REPORT*  Clinical Data: Sudden onset of dizziness.  Recent hypertension. Abnormal CT scan.  Mass versus hemorrhage.  MRI HEAD WITHOUT AND WITH CONTRAST  Technique:  Multiplanar, multiecho pulse sequences of the brain and surrounding  structures were obtained according to standard protocol without and with intravenous contrast  Contrast: 15mL MULTIHANCE GADOBENATE DIMEGLUMINE 529 MG/ML IV SOLN  Comparison: CT head without contrast 12/21/2011.  Findings: A 9 x 10 x 9 mm T2 hyperintense lesion is evident within the left frontal lobe.  A well-defined T2 hypointense rim surrounds the lesion with susceptibility.  Slight enhancement of the lesion is consistent with vascularity.  The lesion is isointense to white matter on T1-weighted images.  No additional lesions are present. Significant susceptibility artifact is associated on the diffusion sequence.  No acute infarct is present.  No other hemorrhage or mass is evident.  No significant white matter disease is present.  Flow is present in the major intracranial arteries.  The globes and orbits are intact.  Mild mucosal thickening is present maxillary sinuses and ethmoid air cells bilaterally.  The mastoid air cells are clear.  IMPRESSION:  1.  10 mm lesion in the left frontal lobe with surrounding susceptibility compatible with hemosiderin ring.  This lesion is compatible with a cavernous hemangioma.  There is no evidence for neoplasm or a more ominous vascular lesion.  There may have been a recent  repeat hemorrhage of the lesion. 2.  No additional lesions are present. 3.  Minimal sinus disease.   Original Report Authenticated By: Marin Roberts, M.D.    US Abdomen Complete  12/23/2011  *RADIOLOGY REPORT*  Clinical Data:  History of epigastric pain.  ABDOMINAL ULTRASOUND COMPLETE  Comparison:  CT 12/23/2011.  Findings:  Gallbladder: No shadowing gallstones or echogenic sludge. No gallbladder wall thickening or pericholecystic fluid. The gallbladder wall thickness measured 1.5 mm. No sonographic Murphy's sign according to the ultrasound technologist.  CBD: Normal in caliber measuring 5.1 mm. No choledocholithiasis is evident.  Liver:  Normal size and echotexture without focal parenchymal  abnormality. Portal vein is patent with hepatopetal flow.  IVC:  Patent throughout its visualized course in the abdomen.  Pancreas:  Although the pancreas is difficult to visualize in its entirety, no focal pancreatic abnormality is identified.  Spleen:  Normal size and echotexture without focal abnormality. Length is 7.8 cm.  Right kidney:  No hydronephrosis.  Well-preserved cortex.  Normal parenchymal echotexture without focal abnormalities.  Right renal length is 12.0 cm.  Left kidney:  No hydronephrosis.  Well-preserved cortex.  Normal parenchymal echotexture without focal abnormalities.  Left renal length is 12.7 cm.  Aorta:  Maximum diameter is 1.7 cm.  No aneurysm is evident.  Ascites:  None.  IMPRESSION: No abdominal pathology was demonstrated.   Original Report Authenticated By: Onalee Hua Call     Review of Systems  Constitutional: Negative for fever and chills.  HENT: Negative for hearing loss.   Eyes: Negative for blurred vision, double vision and photophobia.  Respiratory: Negative for cough and shortness of breath.   Cardiovascular: Negative for chest pain.  Gastrointestinal: Positive for heartburn, nausea, vomiting and abdominal pain. Negative for diarrhea and constipation.  Genitourinary: Positive for flank pain. Negative for dysuria.  Musculoskeletal: Negative for falls.  Neurological: Positive for dizziness. Negative for headaches.   Blood pressure 176/99, pulse 107, temperature 97.9 F (36.6 C), temperature source Oral, resp. rate 20, height 5\' 4"  (1.626 m), weight 157 lb (71.215 kg), last menstrual period 11/20/2011, SpO2 100.00%. Physical Exam  Constitutional: She is oriented to person, place, and time. She appears well-developed and well-nourished. No distress.  HENT:  Head: Normocephalic and atraumatic.  Eyes: Pupils are equal, round, and reactive to light.  Neck: Normal range of motion.  Cardiovascular: Normal rate and regular rhythm.   Respiratory: Effort normal.  GI:  Soft. She exhibits no distension. There is no tenderness. There is no rebound and no guarding.  Neurological: She is alert and oriented to person, place, and time.  Skin: She is not diaphoretic.    Assessment/Plan: I do not believe that her nausea is being caused by her gallbladder. She has a normal right upper quadrant ultrasound, which is the most sensitive test for gallbladder pathology. Her symptoms are also not very consistent with gallbladder pathology. She appears to have nausea and vertigo as her main symptoms. I would recommend continued workup for these. If her RUQ abdominal pain got worse, a HIDA scan could be performed to rule out biliary dyskinesia.  Enna Warwick C. 12/23/2011, 6:26 PM

## 2011-12-23 NOTE — ED Notes (Signed)
Patient reports dizziness and vomiting since Monday.  Patient had an MRI and CT of her head which were negative.  Patient went to her PCP today and had an abdominal CT scan which showed sludge in her gall bladder.  Patient states her WBC count is elevated.  Patient denies fevers and abdominal pain.   Patient also reports lower back pain, loss of appetite, chills, and cold sweats.

## 2011-12-23 NOTE — Telephone Encounter (Signed)
Message copied by Verner Chol on Wed Dec 23, 2011  7:35 AM ------      Message from: Lelon Perla      Created: Tue Dec 22, 2011  8:12 PM       She is really sick with high bp.  I told her to come in in am and we would put her in procedure room right away to lie down.  I told her to come at 69 and I would see her between patients.   I wanted you all to be aware.      Thanks.

## 2011-12-23 NOTE — Telephone Encounter (Signed)
I Put her in at 8:15am so she would be on the schedule. Also note I spoke with her on Monday and offered her a Tuesday appt.,. But she had something going on and could not come it Thanks

## 2011-12-23 NOTE — Telephone Encounter (Signed)
I know thanks

## 2011-12-23 NOTE — ED Provider Notes (Signed)
History     CSN: [id number]  Arrival date & time 12/23/11  1608   First MD Initiated Contact with Patient 12/23/11 1638      Chief Complaint  Patient presents with  . Dizziness    (Consider location/radiation/quality/duration/timing/severity/associated sxs/prior treatment) HPI Comments: 36 year old female with a history of migraines presents emergency department with multiple complaints.  Patient states that everything began 2 days ago with a sudden onset of hyperemesis and vertigo that was evaluated in the emergency department with no acute findings.  Workup at that time included CT and MRI.  Patient had her followup appointment today at her primary care physician (Dr. Laury Axon) who decided to do a noncontrast CT of the abdomen for findings of hematuria, leukocytosis, and flank pain.  Findings on imaging showed biliary sludge.  [place] surgery with consult did advise the patient to come to the emergency department for further evaluation.  Patient denies any significant abdominal pain, but does report mild epigastric pain after eating and worsening acid reflux over the last week.  Vertigo sensations are still persistent as his nausea, however emesis has ceased.  Patient has no other complaints at this time.  The history is provided by the patient and medical records.    Past Medical History  Diagnosis Date  . Migraines   . GERD (gastroesophageal reflux disease)   . Heart murmur     as a child  . Hepatitis A   . Hypertension   . Hypothyroid   . Complication of anesthesia     bp dropped with epidural    Past Surgical History  Procedure Date  . Tonsillectomy and adenoidectomy 1988  . Cesarean section     Family History  Problem Relation Age of Onset  . Prostate cancer Father   . Hypertension Father   . Alcohol abuse Father   . Heart disease Father   . Hyperlipidemia Mother   . Hypertension Brother   . Alcohol abuse Paternal Grandfather   . Heart disease Paternal  Grandfather   . Breast cancer Maternal Aunt   . Depression Maternal Aunt   . Depression Maternal Uncle   . Heart disease Paternal Aunt   . Dementia Maternal Grandmother   . Heart disease Maternal Grandfather   . Heart disease Paternal Grandmother   . Depression Maternal Aunt     History  Substance Use Topics  . Smoking status: Never Smoker   . Smokeless tobacco: Never Used  . Alcohol Use: Yes    OB History    Grav Para Term Preterm Abortions TAB SAB Ect Mult Living                  Review of Systems  Constitutional: Negative for fever, chills and appetite change.  HENT: Negative for hearing loss, congestion, trouble swallowing, neck pain, neck stiffness and tinnitus.   Eyes: Negative for visual disturbance.  Respiratory: Negative for shortness of breath.   Cardiovascular: Negative for chest pain and leg swelling.  Gastrointestinal: Positive for nausea, vomiting (resolved) and abdominal pain. Negative for constipation, blood in stool, abdominal distention and anal bleeding.  Genitourinary: Negative for dysuria, urgency and frequency.  Musculoskeletal: Positive for back pain.  Neurological: Positive for dizziness. Negative for syncope, weakness, light-headedness, numbness and headaches.  Psychiatric/Behavioral: Negative for confusion.  All other systems reviewed and are negative.    Allergies  Penicillins  Home Medications   Current Outpatient Rx  Name  Route  Sig  Dispense  Refill  . ACETAMINOPHEN 325  MG PO TABS   Oral   Take 650 mg by mouth every 6 (six) hours as needed.         . ALPRAZOLAM 0.25 MG PO TABS   Oral   Take 0.125 mg by mouth 3 (three) times daily as needed. Anxiety. Take 1/2 tablet if needed for anxiety.         Marland Kitchen CIPROFLOXACIN HCL 500 MG PO TABS   Oral   Take 500 mg by mouth 2 (two) times daily.         Marland Kitchen DIAZEPAM 5 MG PO TABS   Oral   Take 5 mg by mouth every 6 (six) hours as needed.         Marland Kitchen LEVOTHYROXINE SODIUM 50 MCG PO TABS    Oral   Take 50 mcg by mouth daily.         Marland Kitchen LORATADINE 5 MG PO CHEW   Oral   Chew 5 mg by mouth daily.         Marland Kitchen MECLIZINE HCL 25 MG PO TABS   Oral   Take 25 mg by mouth 3 (three) times daily as needed.         Marland Kitchen ONDANSETRON HCL 8 MG PO TABS   Oral   Take 4 mg by mouth every 8 (eight) hours as needed.           BP 176/99  Pulse 107  Temp 97.9 F (36.6 C) (Oral)  Resp 20  Ht 5\' 4"  (1.626 m)  Wt 157 lb (71.215 kg)  BMI 26.95 kg/m2  SpO2 100%  LMP 11/20/2011  Physical Exam  Nursing note and vitals reviewed. Constitutional: She is oriented to person, place, and time. She appears well-developed and well-nourished. No distress.  HENT:  Head: Normocephalic and atraumatic.  Eyes: Conjunctivae normal and EOM are normal.  Neck: Normal range of motion.  Cardiovascular:       RRR, no aberrancy on auscultation  Pulmonary/Chest: Effort normal.  Musculoskeletal: Normal range of motion.  Neurological: She is alert and oriented to person, place, and time.  Skin: Skin is warm and dry. No rash noted. She is not diaphoretic.  Psychiatric: She has a normal mood and affect. Her behavior is normal.    ED Course  Procedures (including critical care time)  Labs Reviewed - No data to display Ct Abdomen Pelvis Wo Contrast  12/23/2011  *RADIOLOGY REPORT*  Clinical Data: Low back pain radiating to the front.  Nausea and vomiting.  Vertigo.  CT ABDOMEN AND PELVIS WITHOUT CONTRAST  Technique:  Multidetector CT imaging of the abdomen and pelvis was performed following the standard protocol without intravenous contrast.  Comparison: 02/18/2011.  Findings: Lung Bases: Normal.  Liver:  Unenhanced CT was performed per clinician order.  Lack of IV contrast limits sensitivity and specificity, especially for evaluation of abdominal/pelvic solid viscera.  Normal.  Spleen:  Normal.  Gallbladder:  Dependently layering biliary sludge.  No noncontrast evidence of cholecystitis.  Common bile duct:   Normal.  Pancreas:  Normal.  Adrenal glands:  Normal.  Kidneys:  Normal.  No calculi or hydronephrosis.  Stomach:  Normal.  Small bowel:  Normal.  Colon:   Small appendicolith without appendiceal inflammatory changes.  Sigmoid diverticulitis has resolved.  Sigmoid diverticulosis is noted.  No colonic inflammatory changes.  Pelvic Genitourinary:  Retroverted retroflexed uterus.  Physiologic appearance of the uterus and adnexa.  No free fluid.  Urinary bladder appears normal.  Bones:  No aggressive osseous lesions.  Benign bone island adjacent to the left acetabulum.  Mild lumbar facet arthrosis, most pronounced at L4-L5 on the left.  Vasculature: Normal.  IMPRESSION: 1.  No acute abnormality. 2.  Negative for renal calculi. 3.  Biliary sludge. 4.  Resolution of sigmoid diverticulitis.   Original Report Authenticated By: Andreas Newport, M.D.    Ct Head Wo Contrast  12/21/2011  *RADIOLOGY REPORT*  Clinical Data: Extreme nausea.  CT HEAD WITHOUT CONTRAST  Technique:  Contiguous axial images were obtained from the base of the skull through the vertex without contrast.  Comparison: None.  Findings: 1.1 cm oval area of mildly increased density in the periphery of the left cerebral hemisphere in the posterior left frontal lobe, best seen on image number 16.  There is some asymmetry of the white matter low density in both cerebral hemispheres which appears to be due to mild tilting of the patient's head within the gantry.  No definite true asymmetry is identified.  The ventricles are normal in size and position.  No posterior fossa abnormalities are visualized.  Unremarkable bones and included paranasal sinuses.  IMPRESSION: 1.1 cm oval area of mildly increased density in the periphery of the posterior aspect of the left frontal lobe.  This could represent a small area of mild cortical hemorrhage associated with a small mass or area of infarction.  This could also represent increased density associate with a mass without  hemorrhage.  This would be an atypical appearance for focal infection.  Further evaluation with pre and postcontrast magnetic resonance imaging of the head is recommended.   Original Report Authenticated By: Beckie Salts, M.D.    Mr Laqueta Jean Wo Contrast  12/21/2011  *RADIOLOGY REPORT*  Clinical Data: Sudden onset of dizziness.  Recent hypertension. Abnormal CT scan.  Mass versus hemorrhage.  MRI HEAD WITHOUT AND WITH CONTRAST  Technique:  Multiplanar, multiecho pulse sequences of the brain and surrounding structures were obtained according to standard protocol without and with intravenous contrast  Contrast: 15mL MULTIHANCE GADOBENATE DIMEGLUMINE 529 MG/ML IV SOLN  Comparison: CT head without contrast 12/21/2011.  Findings: A 9 x 10 x 9 mm T2 hyperintense lesion is evident within the left frontal lobe.  A well-defined T2 hypointense rim surrounds the lesion with susceptibility.  Slight enhancement of the lesion is consistent with vascularity.  The lesion is isointense to white matter on T1-weighted images.  No additional lesions are present. Significant susceptibility artifact is associated on the diffusion sequence.  No acute infarct is present.  No other hemorrhage or mass is evident.  No significant white matter disease is present.  Flow is present in the major intracranial arteries.  The globes and orbits are intact.  Mild mucosal thickening is present maxillary sinuses and ethmoid air cells bilaterally.  The mastoid air cells are clear.  IMPRESSION:  1.  10 mm lesion in the left frontal lobe with surrounding susceptibility compatible with hemosiderin ring.  This lesion is compatible with a cavernous hemangioma.  There is no evidence for neoplasm or a more ominous vascular lesion.  There may have been a recent repeat hemorrhage of the lesion. 2.  No additional lesions are present. 3.  Minimal sinus disease.   Original Report Authenticated By: Marin Roberts, M.D.    US Abdomen Complete  12/23/2011   *RADIOLOGY REPORT*  Clinical Data:  History of epigastric pain.  ABDOMINAL ULTRASOUND COMPLETE  Comparison:  CT 12/23/2011.  Findings:  Gallbladder: No shadowing gallstones or echogenic sludge. No gallbladder wall thickening or  pericholecystic fluid. The gallbladder wall thickness measured 1.5 mm. No sonographic Murphy's sign according to the ultrasound technologist.  CBD: Normal in caliber measuring 5.1 mm. No choledocholithiasis is evident.  Liver:  Normal size and echotexture without focal parenchymal abnormality. Portal vein is patent with hepatopetal flow.  IVC:  Patent throughout its visualized course in the abdomen.  Pancreas:  Although the pancreas is difficult to visualize in its entirety, no focal pancreatic abnormality is identified.  Spleen:  Normal size and echotexture without focal abnormality. Length is 7.8 cm.  Right kidney:  No hydronephrosis.  Well-preserved cortex.  Normal parenchymal echotexture without focal abnormalities.  Right renal length is 12.0 cm.  Left kidney:  No hydronephrosis.  Well-preserved cortex.  Normal parenchymal echotexture without focal abnormalities.  Left renal length is 12.7 cm.  Aorta:  Maximum diameter is 1.7 cm.  No aneurysm is evident.  Ascites:  None.  IMPRESSION: No abdominal pathology was demonstrated.   Original Report Authenticated By: Onalee Hua Call      No diagnosis found.  Consult:  surgery- order [place] and will see in ER  MDM  36 yo F to ER for further evaluation of biliary sludge seen on abd CT ordered by PCP. [place] neg for acute abdominal pathology. Labs ordered this am by PCP reviewed, no leukocytosis seen and normal UA. Pt still w persistent vertigo, but emesis has ceased. Imaging performed on 11/25 r/o cental etiology of dizziness. Pt has been advised to continue follow up with her PCP in regards to her symptoms.  At this time there does not appear to be any evidence of an acute emergency medical condition and the patient appears stable for  discharge with appropriate outpatient follow up.Diagnosis was discussed with patient who verbalizes understanding and is agreeable to discharge. Pt case discussed with Dr. Rosalia Hammers who agrees with my plan.          Jaci Carrel, [place] 12/23/11 1905

## 2011-12-23 NOTE — ED Notes (Signed)
Pt alert and oriented x4. Respirations even and unlabored, bilateral symmetrical rise and fall of chest. Skin warm and dry. In no acute distress. Denies needs.   

## 2011-12-23 NOTE — Patient Instructions (Signed)

## 2011-12-27 LAB — CULTURE, URINE COMPREHENSIVE: Colony Count: >=100000

## 2011-12-28 ENCOUNTER — Telehealth: Payer: Self-pay | Admitting: Family Medicine

## 2011-12-28 DIAGNOSIS — R42 Dizziness and giddiness: Secondary | ICD-10-CM

## 2011-12-28 NOTE — Telephone Encounter (Signed)
Ref put in.      KP 

## 2011-12-28 NOTE — Telephone Encounter (Signed)
pt called stated she must have a referral today if possible as she has been out of work for Vertigo, wants to dee dr.rosin at [facility] Cb# [phone]

## 2011-12-28 NOTE — ED Provider Notes (Signed)
Medical screening examination/treatment/procedure(s) were performed by non-physician practitioner and as supervising physician I was immediately available for consultation/collaboration.   Sareena Odeh H Gerri Acre, MD 12/28/11 1712 

## 2012-01-25 ENCOUNTER — Other Ambulatory Visit: Payer: Self-pay | Admitting: Family Medicine

## 2012-01-25 DIAGNOSIS — J111 Influenza due to unidentified influenza virus with other respiratory manifestations: Secondary | ICD-10-CM

## 2012-01-25 MED ORDER — OSELTAMIVIR PHOSPHATE 75 MG PO CAPS: 75.0000 mg | ORAL_CAPSULE | Freq: Two times a day (BID) | ORAL | Status: DC

## 2012-03-02 DIAGNOSIS — H532 Diplopia: Secondary | ICD-10-CM | POA: Insufficient documentation

## 2012-03-02 DIAGNOSIS — IMO0002 Reserved for concepts with insufficient information to code with codable children: Secondary | ICD-10-CM | POA: Insufficient documentation

## 2012-03-12 ENCOUNTER — Other Ambulatory Visit: Payer: Self-pay

## 2012-12-01 ENCOUNTER — Other Ambulatory Visit: Payer: Self-pay

## 2012-12-21 ENCOUNTER — Encounter: Payer: Self-pay | Admitting: Obstetrics and Gynecology

## 2013-01-11 ENCOUNTER — Ambulatory Visit (INDEPENDENT_AMBULATORY_CARE_PROVIDER_SITE_OTHER): Payer: 59 | Admitting: Internal Medicine

## 2013-01-11 ENCOUNTER — Encounter: Payer: Self-pay | Admitting: Internal Medicine

## 2013-01-11 VITALS — BP 137/90 | HR 98 | Temp 98.0°F | Wt 170.0 lb

## 2013-01-11 DIAGNOSIS — H6690 Otitis media, unspecified, unspecified ear: Secondary | ICD-10-CM

## 2013-01-11 DIAGNOSIS — H6691 Otitis media, unspecified, right ear: Secondary | ICD-10-CM

## 2013-01-11 MED ORDER — AZITHROMYCIN 250 MG PO TABS: ORAL_TABLET | ORAL | Status: DC

## 2013-01-11 MED ORDER — LEVOTHYROXINE SODIUM 50 MCG PO TABS: 50.0000 ug | ORAL_TABLET | Freq: Every day | ORAL | Status: DC

## 2013-01-11 MED ORDER — NEOMYCIN-COLIST-HC-THONZONIUM 3.3-3-10-0.5 MG/ML OT SUSP: 3.0000 [drp] | Freq: Four times a day (QID) | OTIC | Status: DC

## 2013-01-11 NOTE — Progress Notes (Signed)
Pre visit review using our clinic review tool, if applicable. No additional management support is needed unless otherwise documented below in the visit note. 

## 2013-01-11 NOTE — Patient Instructions (Signed)
Use medications as prescribed Avoid water  entering the right ear until you completely well Okay to use Tylenol Motrin as needed for pain Mucinex DM OTC as needed for cough Call if you're not back to normal in 2 weeks, call anytime if you have severe symptoms

## 2013-01-11 NOTE — Progress Notes (Signed)
   Subjective:    Patient ID: Emily Barker, female    DOB: 1975-03-29, 37 y.o.   MRN: [id number]  HPI Acute visit Develop a URI last week: Stuffy nose, cough, yellow nasal discharge. Yesterday developed sharp right ear pain, took Motrin. This morning the pain is better but  she has decreased hearing on the right side and she feels like there is cotton in there.  Past Medical History  Diagnosis Date  . Migraines   . GERD (gastroesophageal reflux disease)   . Heart murmur     as a child  . Hepatitis A   . Hypertension   . Hypothyroid   . Complication of anesthesia     bp dropped with epidural  . Contraception     husband vasectomy   Past Surgical History  Procedure Laterality Date  . Tonsillectomy and adenoidectomy  1988  . Dilation and curettage of uterus     History   Social History  . Marital Status: Married    Spouse Name: N/A    Number of Children: 3  . Years of Education: N/A   Occupational History  . Not on file.   Social History Main Topics  . Smoking status: Never Smoker   . Smokeless tobacco: Never Used  . Alcohol Use: Yes  . Drug Use: No  . Sexual Activity: Not on file   Other Topics Concern  . Not on file   Social History Narrative   Original from [place]    Review of Systems No fever or chills Cough is dry. Question of mild clear discharge from the right ear. Usually has mild tinnitus and it is slightly more noticeable today    Objective:   Physical Exam BP 137/90  Pulse 98  Temp(Src) 98 F (36.7 C)  Wt 170 lb (77.111 kg)  SpO2 100%  General -- alert, well-developed, NAD.  Neck --no LAD; Not tender to percussion at the mastoid area HEENT-- Not pale. TM L normal; R: bulge, red, No obvious perf, canal is normal without discharge. Face symmetric, sinuses not tender to palpation. Nose  congested.  Lungs -- normal respiratory effort, no intercostal retractions, no accessory muscle use, and normal breath sounds.  Heart-- normal rate,  regular rhythm, no murmur.   Neurologic--  alert & oriented X3. Speech normal, gait normal, strength normal in all extremities.  Psych-- Cognition and judgment appear intact. Cooperative with normal attention span and concentration. No anxious appearing , no depressed appearing.        Assessment & Plan:  OMA, Otitis media, no obvious perforation of TM but pt thinks she saw some d/c Plan: Zithromax, ear drops, see instructions.

## 2013-01-13 ENCOUNTER — Encounter: Payer: Self-pay | Admitting: Obstetrics and Gynecology

## 2013-02-01 ENCOUNTER — Ambulatory Visit: Payer: BC Managed Care – PPO | Admitting: Endocrinology

## 2013-02-01 DIAGNOSIS — Z0289 Encounter for other administrative examinations: Secondary | ICD-10-CM

## 2013-02-13 ENCOUNTER — Encounter: Payer: Self-pay | Admitting: Obstetrics and Gynecology

## 2013-02-17 ENCOUNTER — Telehealth: Payer: Self-pay | Admitting: *Deleted

## 2013-02-17 MED ORDER — LEVOTHYROXINE SODIUM 50 MCG PO TABS: 50.0000 ug | ORAL_TABLET | Freq: Every day | ORAL | Status: DC

## 2013-02-17 NOTE — Telephone Encounter (Signed)
Done. TSH lab visit needed for additional refills.

## 2013-02-17 NOTE — Telephone Encounter (Signed)
Patient called and stated that she needed a refill for levothyroxine (SYNTHROID, LEVOTHROID) 50 MCG tablet   Pharmacy [facility]

## 2013-02-27 ENCOUNTER — Ambulatory Visit (INDEPENDENT_AMBULATORY_CARE_PROVIDER_SITE_OTHER): Payer: 59 | Admitting: Obstetrics and Gynecology

## 2013-02-27 ENCOUNTER — Encounter: Payer: Self-pay | Admitting: Obstetrics and Gynecology

## 2013-02-27 ENCOUNTER — Telehealth: Payer: Self-pay | Admitting: *Deleted

## 2013-02-27 VITALS — BP 147/88 | HR 104 | Resp 18 | Ht 64.0 in | Wt 168.0 lb

## 2013-02-27 DIAGNOSIS — Z0000 Encounter for general adult medical examination without abnormal findings: Secondary | ICD-10-CM

## 2013-02-27 DIAGNOSIS — Z01419 Encounter for gynecological examination (general) (routine) without abnormal findings: Secondary | ICD-10-CM

## 2013-02-27 DIAGNOSIS — N63 Unspecified lump in unspecified breast: Secondary | ICD-10-CM

## 2013-02-27 DIAGNOSIS — IMO0002 Reserved for concepts with insufficient information to code with codable children: Secondary | ICD-10-CM

## 2013-02-27 DIAGNOSIS — N6320 Unspecified lump in the left breast, unspecified quadrant: Secondary | ICD-10-CM

## 2013-02-27 LAB — POCT URINALYSIS DIPSTICK
Bilirubin, UA: NEGATIVE
Blood, UA: NEGATIVE
Glucose, UA: NEGATIVE
Ketones, UA: NEGATIVE
Leukocytes, UA: NEGATIVE
Nitrite, UA: NEGATIVE
Protein, UA: NEGATIVE
Urobilinogen, UA: NEGATIVE
pH, UA: 5

## 2013-02-27 NOTE — Telephone Encounter (Signed)
Patient in office today to see dr Quincy Simmonds.  Needs bilateral diagnostic MMG and left breast ultrasound.  Spoke to Cherish at breast center and scheduled appt for Thursday 03-09-13 at 1045.  Patient agreeable to date and time. Directions given.  Routing to provider for final review. Patient agreeable to disposition. Will close encounter

## 2013-02-27 NOTE — Progress Notes (Signed)
GYNECOLOGY VISIT  PCP:  Valarie Merino, MD  Referring provider:   HPI: 37 y.o.   Married  Hispanic  female   [phone] with Patient's last menstrual period was 01/29/2013.   here for   NGYN/ Annual Exam. Menses regular.  Some new cramping.  Can have premenstrual cramping. Dyspareunia the week before menstruation.  It is positional and with deep penetration.  This is consistent for one year, and this concerns the patient.  No new partners.  Has a history of a left breast cyst.  Had ultrasound and mammogram in past and this is a benign area size of a pea per patient.   New diagnosis of panic attacks.  Off medication due to side effects.  Takes Xanax rarely, last in 2013. Also diagnosis of vertigo diagnosis in 2013 - lead to horizontal misalignment of her vision. Had MRI showing a cavernous hemangioma of the brain, non-bleeding.   Patient with Hashimotos' thyroiditis.   Cardiac ECHO in 2012.  Some palpitations attributed to anxiety.   Up to bathroom more than twice  Per night sometimes.  Drinks a Research scientist (life sciences).   Mother had menopause at age 59.  Hgb:  PCP Urine:  Neg  GYNECOLOGIC HISTORY: Patient's last menstrual period was 01/29/2013. Sexually active:  yes Partner preference: female Contraception:   Vasectomy Menopausal hormone therapy: no DES exposure:   no Blood transfusions:   no Sexually transmitted diseases:   HPV GYN Procedures:  Colpo/ Bx/ LEEP  (2007) Mammogram:    2008 normal             Pap:   05/2011 neg History of abnormal pap smear:  Yes 2007 CIN 2 +HRHPV   OB History   Grav Para Term Preterm Abortions TAB SAB Ect Mult Living   5 3   2 1 1   3        LIFESTYLE: Exercise:    Not now (starting this week)           Tobacco: no Alcohol: 2-3 glasses of wine a week Drug use: no   OTHER HEALTH MAINTENANCE: Tetanus/TDap: 2011 Gardisil: no Influenza:  yes Zostavax:no  Bone density: never Colonoscopy: never  Cholesterol check:  Maybe in  2013  Family History  Problem Relation Age of Onset  . Prostate cancer Father   . Hypertension Father   . Alcohol abuse Father   . Heart disease Father   . Hyperlipidemia Mother   . Hypertension Brother   . Alcohol abuse Paternal Grandfather   . Heart disease Paternal Grandfather   . Breast cancer Maternal Aunt   . Depression Maternal Aunt   . Depression Maternal Uncle   . Heart disease Paternal Aunt   . Dementia Maternal Grandmother   . Heart disease Maternal Grandfather   . Heart disease Paternal Grandmother   . Depression Maternal Aunt     Patient Active Problem List   Diagnosis Date Noted  . Hypothyroidism 10/09/2011  . Thyroid nodule 10/02/2011  . Anxiety 10/02/2011  . HTN (hypertension) 09/21/2011   Past Medical History  Diagnosis Date  . Migraines   . GERD (gastroesophageal reflux disease)   . Heart murmur     as a child  . Hepatitis A   . Hypertension   . Hypothyroid   . Complication of anesthesia     bp dropped with epidural  . Contraception     husband vasectomy  . Anxiety 2012  . Panic disorder 2012  . Dysmenorrhea   .  Abnormal Pap smear of cervix 2007    CIN 2 + HRHPV/ LEEP    Past Surgical History  Procedure Laterality Date  . Tonsillectomy and adenoidectomy  1988  . Dilation and curettage of uterus    . Cervical biopsy  w/ loop electrode excision  2007    CIN 2  . Colposcopy  2007    CIN 2/ HRHPV    ALLERGIES: Penicillins  Current Outpatient Prescriptions  Medication Sig Dispense Refill  . Cholecalciferol (VITAMIN D PO) Take 500 Units by mouth daily.      . diazepam (VALIUM) 5 MG tablet Take 5 mg by mouth every 6 (six) hours as needed.      Marland Kitchen. levothyroxine (SYNTHROID, LEVOTHROID) 50 MCG tablet Take 1 tablet (50 mcg total) by mouth daily. Please schedule lab visit for additional refills [phone]  30 tablet  0  . loratadine (CLARITIN) 5 MG chewable tablet Chew 5 mg by mouth as needed.       . Multiple Vitamin (MULTI-VITAMIN DAILY PO)  Take by mouth daily.      Marland Kitchen. OVER THE COUNTER MEDICATION [employer] once a day      . ALPRAZolam (XANAX) 0.25 MG tablet Take 0.125 mg by mouth 3 (three) times daily as needed. Anxiety. Take 1/2 tablet if needed for anxiety.       No current facility-administered medications for this visit.     ROS:  Pertinent items are noted in HPI.  SOCIAL HISTORY:  Student  PHYSICAL EXAMINATION:    BP 147/88  Pulse 104  Resp 18  Ht 5\' 4"  (1.626 m)  Wt 168 lb (76.204 kg)  BMI 28.82 kg/m2  LMP 01/29/2013   Wt Readings from Last 3 Encounters:  02/27/13 168 lb (76.204 kg)  01/11/13 170 lb (77.111 kg)  12/23/11 157 lb (71.215 kg)     Ht Readings from Last 3 Encounters:  02/27/13 5\' 4"  (1.626 m)  12/23/11 5\' 4"  (1.626 m)  09/30/10 5\' 4"  (1.626 m)    General appearance: alert, cooperative and appears stated age Head: Normocephalic, without obvious abnormality, atraumatic Neck: no adenopathy, supple, symmetrical, trachea midline and thyroid not enlarged, 1.5 cm left thyroid nodule (old), no tenderness. Lungs: clear to auscultation bilaterally Breasts: Left breast - pea sized nodule at 5 - 6 o'clock.  Inspection negative, No nipple retraction or dimpling, No nipple discharge or bleeding, No axillary or supraclavicular adenopathy, Normal to palpation without dominant masses on the right breast. Heart: regular rate and rhythm Abdomen: soft, non-tender; no masses,  no organomegaly Extremities: extremities normal, atraumatic, no cyanosis or edema Skin: Skin color, texture, turgor normal. No rashes or lesions Lymph nodes: Cervical, supraclavicular, and axillary nodes normal. No abnormal inguinal nodes palpated Neurologic: Grossly normal  Pelvic: External genitalia:  no lesions              Urethra:  normal appearing urethra with no masses, tenderness or lesions              Bartholins and Skenes: normal                 Vagina: normal appearing vagina with normal color and discharge, no lesions               Cervix: normal appearance, consistent wiht LEEP.               Pap and high risk HPV testing done: yes.            Bimanual Exam:  Uterus:  uterus is normal size, shape, consistency and nontender, retroverted.                                      Adnexa: normal adnexa in size, nontender and no masses                                      Rectovaginal: Confirms                                      Anus:  normal sphincter tone, no lesions  ASSESSMENT  History of LEEP. Left breast mass, history of cysts. Mildly elevated blood pressure.  Nocturia.  Dyspareunia.   PLAN  Diagnostic bilateral mammogram and left breast ultrasound at [facility].  Pap smear and high risk HPV testing. Follow up with PCP for BP recheck.  Counseled on bladder irritants.  Return for pelvic ultrasound and discussion of results.  Return annually or prn   An After Visit Summary was printed and given to the patient.

## 2013-02-27 NOTE — Patient Instructions (Signed)

## 2013-02-28 ENCOUNTER — Telehealth: Payer: Self-pay | Admitting: Obstetrics and Gynecology

## 2013-02-28 NOTE — Telephone Encounter (Signed)
Left message for patient to call back to go over insurance information and to schedule.

## 2013-03-01 LAB — IPS PAP TEST WITH HPV

## 2013-03-06 NOTE — Telephone Encounter (Signed)
Left message for patient to call back to schedule PUS

## 2013-03-09 ENCOUNTER — Other Ambulatory Visit: Payer: 59

## 2013-03-10 NOTE — Telephone Encounter (Signed)
Left message for patient to call back  

## 2013-03-16 ENCOUNTER — Telehealth: Payer: Self-pay

## 2013-03-16 DIAGNOSIS — E039 Hypothyroidism, unspecified: Secondary | ICD-10-CM

## 2013-03-16 NOTE — Telephone Encounter (Signed)
Message copied by Ewing Schlein on Thu Mar 16, 2013  3:56 PM ------      Message from: Lelon Frohlich P      Created: Thu Mar 16, 2013  3:46 PM      Regarding: tsh labs       Per phone note from last refill, patient is due for tsh labs. She has appt on Monday, 03/20/13. Can you place orders please? ------

## 2013-03-16 NOTE — Telephone Encounter (Signed)
Order in      KP 

## 2013-03-17 NOTE — Telephone Encounter (Signed)
Left message for patient to call back to go over benefits and to schedule recommended pus ° °

## 2013-03-20 ENCOUNTER — Other Ambulatory Visit: Payer: Self-pay | Admitting: Internal Medicine

## 2013-03-20 ENCOUNTER — Other Ambulatory Visit: Payer: 59

## 2013-03-24 ENCOUNTER — Other Ambulatory Visit: Payer: 59

## 2013-03-28 ENCOUNTER — Telehealth: Payer: Self-pay | Admitting: Emergency Medicine

## 2013-03-28 NOTE — Telephone Encounter (Signed)
Left message for patient to call back to go over benefits and to schedule recommended pus

## 2013-03-28 NOTE — Telephone Encounter (Signed)
Emily Barker,  Thank you for your effort to contact the patient to schedule a pelvic ultrasound. No further effort is needed. The patient will need to call when she is ready.

## 2013-03-28 NOTE — Telephone Encounter (Signed)
Patient did not keep appointment for diagnostic Mammogram and Breast Ultrasound.   Left message to advise that we were following up on missed appointment.  Please call us back or call breast center to reschedule.

## 2013-03-31 ENCOUNTER — Telehealth: Payer: Self-pay | Admitting: Family Medicine

## 2013-03-31 ENCOUNTER — Other Ambulatory Visit: Payer: Self-pay | Admitting: Family Medicine

## 2013-03-31 DIAGNOSIS — E063 Autoimmune thyroiditis: Secondary | ICD-10-CM

## 2013-03-31 NOTE — Telephone Encounter (Signed)
Patient called to make lab appt for Monday, 04/03/13. She states she also needs to be tested for antibodies. She states she has been diagnosed with Hashimoto's disease. Please advise.

## 2013-03-31 NOTE — Telephone Encounter (Signed)
Please advise      KP 

## 2013-03-31 NOTE — Telephone Encounter (Signed)
Patient also would like her T4 and T3 tested as well. Please advise

## 2013-03-31 NOTE — Telephone Encounter (Signed)
Spoke with patient. She has been ill and had a recent death in the family.   She requested to schedule PUS with Dr. Quincy Simmonds.  Pelvic U/S scheduled and patient aware/agreeable to time.  Patient verbalized understanding of the U/S appointment cancellation policy. Advised will need to cancel within 72 business hours (3 business days) or will have $100.00 no show fee placed to account.  Advised would precert and call her with insurance coverage if more than a copay for procedure.   Patient states she will call [facility] to reschedule her dx mammogram with [place]. Remains in Mammogram hold.

## 2013-03-31 NOTE — Telephone Encounter (Signed)
Called patient. Advised that per plan benefits quoted, she will be liable for $55.60 for PUS. Payment is expected at the time of service. Patient agreeable.

## 2013-04-03 ENCOUNTER — Other Ambulatory Visit: Payer: 59

## 2013-04-04 ENCOUNTER — Other Ambulatory Visit: Payer: 59

## 2013-04-06 ENCOUNTER — Telehealth: Payer: Self-pay | Admitting: Obstetrics and Gynecology

## 2013-04-06 ENCOUNTER — Other Ambulatory Visit: Payer: 59

## 2013-04-06 ENCOUNTER — Other Ambulatory Visit: Payer: 59 | Admitting: Obstetrics and Gynecology

## 2013-04-06 NOTE — Telephone Encounter (Signed)
Thank you. Unfortunately, we will need to charge for the cancellation of the appointment.

## 2013-04-06 NOTE — Telephone Encounter (Signed)
Patient called and canceled her PUS this morning left message on phone at 7:25 am. Emily Barker she got her cycle yesterday and doesn't feel comfortable having pus. Wants to reschedule for next week or the following. Patient understands that she will be charged since she is supposed to cancel them 72 hrs in advance.

## 2013-04-07 NOTE — Telephone Encounter (Signed)
Called patient. Rescheduled PUS. Patient needs to pay $55.60 PR plus [facility] fee of $100 for cancellation.

## 2013-04-07 NOTE — Telephone Encounter (Signed)
Emily Barker, please call patient to reschedule.

## 2013-04-14 ENCOUNTER — Other Ambulatory Visit: Payer: 59

## 2013-04-18 ENCOUNTER — Other Ambulatory Visit (HOSPITAL_COMMUNITY): Payer: Self-pay | Admitting: Endocrinology

## 2013-04-18 DIAGNOSIS — E049 Nontoxic goiter, unspecified: Secondary | ICD-10-CM

## 2013-04-20 ENCOUNTER — Ambulatory Visit (INDEPENDENT_AMBULATORY_CARE_PROVIDER_SITE_OTHER): Payer: 59

## 2013-04-20 ENCOUNTER — Encounter: Payer: Self-pay | Admitting: Obstetrics and Gynecology

## 2013-04-20 ENCOUNTER — Ambulatory Visit (INDEPENDENT_AMBULATORY_CARE_PROVIDER_SITE_OTHER): Payer: 59 | Admitting: Obstetrics and Gynecology

## 2013-04-20 VITALS — BP 130/80 | HR 80 | Ht 64.0 in | Wt 170.8 lb

## 2013-04-20 DIAGNOSIS — IMO0002 Reserved for concepts with insufficient information to code with codable children: Secondary | ICD-10-CM

## 2013-04-20 DIAGNOSIS — F411 Generalized anxiety disorder: Secondary | ICD-10-CM

## 2013-04-20 DIAGNOSIS — F419 Anxiety disorder, unspecified: Secondary | ICD-10-CM

## 2013-04-20 MED ORDER — ALPRAZOLAM 0.25 MG PO TABS: 0.1250 mg | ORAL_TABLET | Freq: Three times a day (TID) | ORAL | Status: DC | PRN

## 2013-04-20 NOTE — Progress Notes (Signed)
Subjective  38 y.o. Married Hispanic female  [phone] with Patient's last menstrual period was 01/29/2013.  here for pelvic ultrasound.  Menses regular. Some new cramping.  Can have premenstrual cramping.  Dyspareunia the week before menstruation. It is positional and with deep penetration. This is consistent for one year, and this concerns the patient.   Does not like OCPs.  Does not feel well when takes placebo pills.   Patient diagnosed with panic disorder in 2012. Has an old prescription.  Post traumatic stress disorder initiated this.  Takes a Xanax 0.25 mg, 1/2 mg.  Took an old Xanax and got a headache. Asking for 10 Xanax.  Takes a prescription. Has a therapist.   Objective  Ultrasound images and report personally reviewed by me and the patient.   Uterus retroverted, normal with EMS 8.98 and small sliver of fluid.  Left CL cyst 1.22 mm.  Mild left adnexal free fluid.  Normal right ovary.      Assessment  Dyspareunia. Normal pelvic ultrasound.  Retroverted uterus?  Endometriosis? Anxiety with panic disorder.  Plan  Observation of dyspareunia. Discussed Lubricants, OCPs (Declined), Mirena IUD, and pelvic laparoscopy for investigation and treatment of pain.  Rx for Xanax 0.25 mg, take 1/2 tablet po tid prn.  #10.  RF zero.  Patient will obtain refills from her PCP if needed.  25 minutes face to face time of which over 50% was spent in counseling.   After visit summary to patient.

## 2013-05-08 ENCOUNTER — Encounter: Payer: Self-pay | Admitting: *Deleted

## 2013-05-25 ENCOUNTER — Encounter: Payer: Self-pay | Admitting: *Deleted

## 2013-05-25 ENCOUNTER — Telehealth: Payer: Self-pay | Admitting: *Deleted

## 2013-05-25 NOTE — Telephone Encounter (Signed)
Letter mailed per Dr Sabra Heck.

## 2013-05-25 NOTE — Telephone Encounter (Signed)
Message copied by Jaymes Graff on Thu May 25, 2013  6:04 PM ------      Message from: FAST, TRACY L      Created: Mon May 08, 2013 10:56 AM      Regarding: FW: Mammogram Hold                   ----- Message -----         From: Odelia Gage, MD         Sent: 05/08/2013   9:56 AM           To: Karen Chafe, RN      Subject: RE: Mammogram Hold                                       Send a letter to the patient please.      Gay Filler may have something in file that is appropriate.            Thanks.            ----- Message -----         From: Karen Chafe, RN         Sent: 05/05/2013   4:49 PM           To: Brook E Amundson de Berton Lan, MD      Subject: Mammogram Hold                                           Dr. Quincy Simmonds, patient has cancelled and rescheduled her dx mammogram and breast u/s x 2 with the breast center. How would you like me to proceed with hold? Continue to call patient or letter? She was recently in for office visit with you.        ------

## 2013-07-11 ENCOUNTER — Ambulatory Visit (HOSPITAL_COMMUNITY): Payer: 59

## 2013-08-28 ENCOUNTER — Telehealth: Payer: Self-pay | Admitting: *Deleted

## 2013-08-28 NOTE — Telephone Encounter (Addendum)
Deleted as instructed.

## 2013-08-28 NOTE — Telephone Encounter (Signed)
Message copied by Jaymes Graff on Mon Aug 28, 2013  9:38 AM ------      Message from: Megan Salon      Created: Thu Aug 24, 2013  6:59 AM      Regarding: open letter       Gay Filler,      There is an "open" letter for this pt.  It is someone that we sent a letter to regarding breast imaging follow up.  I think you can back in the letter and delete it.  Thanks.            MSM ------

## 2013-09-04 ENCOUNTER — Ambulatory Visit (HOSPITAL_COMMUNITY): Payer: 59

## 2013-09-13 ENCOUNTER — Ambulatory Visit (HOSPITAL_COMMUNITY): Payer: 59

## 2013-10-11 ENCOUNTER — Telehealth: Payer: Self-pay | Admitting: Family Medicine

## 2013-10-11 NOTE — Telephone Encounter (Signed)
agree

## 2013-10-11 NOTE — Telephone Encounter (Signed)
Per Protocol Patient need's an apt. Dr.Lowne has not seen her since 2013. Please offer her an apt.    KP

## 2013-10-11 NOTE — Telephone Encounter (Signed)
Pt is needing new rx for butaldital apap caffenine plus tabs, pt states dr. Etter Sjogren wrote the rx in 2013. Pt states its for her migraines.

## 2013-10-11 NOTE — Telephone Encounter (Signed)
Called pt and informed her that she needed an appt pt stated she would call bck and make the appt next week,

## 2013-10-27 ENCOUNTER — Ambulatory Visit: Payer: 59 | Admitting: Family Medicine

## 2013-10-27 ENCOUNTER — Telehealth: Payer: Self-pay | Admitting: *Deleted

## 2013-10-27 NOTE — Telephone Encounter (Signed)
That is fine----can she see Emily Barker? Do not charge

## 2013-10-27 NOTE — Telephone Encounter (Signed)
Not like her to no show-  Check on her please

## 2013-10-27 NOTE — Telephone Encounter (Signed)
Spoke to pt.  She apologizes, said she completely forgot appointment, she made it yesterday at the last minute.  Pt is requesting a letter from you to excuse her from jury duty due to her panic disorder.  She also wanted to know if she could see someone else while you are out to be able to get her prescription for migraines.  If so, who you recommend.  Please advise.

## 2013-10-27 NOTE — Telephone Encounter (Signed)
Pt did not show for appointment 10/27/2013 at 11:15am for follow up on meds

## 2013-10-30 NOTE — Telephone Encounter (Signed)
See below, do not charge

## 2013-11-08 ENCOUNTER — Encounter: Payer: Self-pay | Admitting: Family

## 2013-11-08 ENCOUNTER — Ambulatory Visit (INDEPENDENT_AMBULATORY_CARE_PROVIDER_SITE_OTHER): Payer: 59 | Admitting: Family

## 2013-11-08 VITALS — BP 134/88 | HR 87 | Temp 98.2°F | Resp 16 | Ht 64.0 in | Wt 169.4 lb

## 2013-11-08 DIAGNOSIS — L309 Dermatitis, unspecified: Secondary | ICD-10-CM

## 2013-11-08 DIAGNOSIS — F419 Anxiety disorder, unspecified: Secondary | ICD-10-CM

## 2013-11-08 DIAGNOSIS — G43909 Migraine, unspecified, not intractable, without status migrainosus: Secondary | ICD-10-CM

## 2013-11-08 DIAGNOSIS — K589 Irritable bowel syndrome without diarrhea: Secondary | ICD-10-CM

## 2013-11-08 DIAGNOSIS — Z23 Encounter for immunization: Secondary | ICD-10-CM

## 2013-11-08 DIAGNOSIS — D1802 Hemangioma of intracranial structures: Secondary | ICD-10-CM

## 2013-11-08 MED ORDER — ALPRAZOLAM 0.25 MG PO TABS: ORAL_TABLET | ORAL | Status: DC

## 2013-11-08 MED ORDER — BUTALBITAL-APAP-CAFFEINE 50-500-40 MG PO CAPS: ORAL_CAPSULE | ORAL | Status: DC

## 2013-11-08 NOTE — Patient Instructions (Signed)
Please go to lab for urine toxicology test. Follow up in 6 months with Dr. Etter Sjogren, sooner if problems/concerns. You will be contacted about your referral for brain MRI.

## 2013-11-08 NOTE — Progress Notes (Signed)
Subjective:    Patient ID: Emily Barker, female    DOB: 01-07-1976, 38 y.o.   MRN: [id number]  HPI  Emily Barker is a 38 yr old female who presents today with several concerns:  1) anxiety-  She received 10 tabs of alprazolam from her GYN in January. Reports that she is using on an as needed basis. Reports that this is a chronic issue for her but it is not debilitating.  She is requesting a refill.  Reports no recent panic attacks  She is working with a Transport planner. She saw a psychiatrist once.    2) Migraine- reports that she gets migraines 3 days prior to her period- lasts 12- 72 hours.  She reports that she uses butalbitol which helps relieve her headache.  Only uses a 1/2 tab.    3) Skin problem- pt reports intermittent skin peeling on her upper lip since the summer.   4) IBS- reports abdominal distension, tenderness. Reports 1/13 she was diagnosed with diverticulitis. She reports that symptoms are worse around her period.  Has intermittent cramping, mucous in stools, gasey, bloated.  She would like a referral to GI.    Review of Systems See HPI  Past Medical History  Diagnosis Date  . Migraines   . GERD (gastroesophageal reflux disease)   . Heart murmur     as a child  . Hepatitis A   . Hypertension   . Hypothyroid   . Complication of anesthesia     bp dropped with epidural  . Contraception     husband vasectomy  . Anxiety 2012  . Panic disorder 2012  . Dysmenorrhea   . Abnormal Pap smear of cervix 2007    CIN 2 + HRHPV/ LEEP  . Vertigo   . Hemangioma, intracranial structures 2013    Left Frontal Lobe    History   Social History  . Marital Status: Married    Spouse Name: N/A    Number of Children: 3  . Years of Education: N/A   Occupational History  . Not on file.   Social History Main Topics  . Smoking status: Never Smoker   . Smokeless tobacco: Never Used  . Alcohol Use: 0.5 oz/week    1 drink(s) per week     Comment: 1-3 glasses of wine a week  .  Drug Use: No  . Sexual Activity: Yes    Partners: Male    Birth Control/ Protection: Other-see comments     Comment: vasectomy   Other Topics Concern  . Not on file   Social History Narrative   Original from [place]    Past Surgical History  Procedure Laterality Date  . Tonsillectomy and adenoidectomy  1988  . Dilation and curettage of uterus    . Cervical biopsy  w/ loop electrode excision  2007    CIN 2  . Colposcopy  2007    CIN 2/ HRHPV    Family History  Problem Relation Age of Onset  . Prostate cancer Father   . Hypertension Father   . Alcohol abuse Father   . Heart disease Father   . Hyperlipidemia Mother   . Hypertension Brother   . Alcohol abuse Paternal Grandfather   . Heart disease Paternal Grandfather   . Breast cancer Maternal Aunt   . Depression Maternal Aunt   . Depression Maternal Uncle   . Heart disease Paternal Aunt   . Dementia Maternal Grandmother   . Heart disease Maternal Grandfather   .  Heart disease Paternal Grandmother   . Depression Maternal Aunt     Allergies  Allergen Reactions  . Penicillins     Unknown. Other family members severely allergic. 1 family member had fatal reaction.     Current Outpatient Prescriptions on File Prior to Visit  Medication Sig Dispense Refill  . ALPRAZolam (XANAX) 0.25 MG tablet Take 0.5 tablets (0.125 mg total) by mouth 3 (three) times daily as needed. Anxiety. Take 1/2 tablet if needed for anxiety.  10 tablet  0  . Cholecalciferol (VITAMIN D PO) Take 500 Units by mouth daily.      Marland Kitchen loratadine (CLARITIN) 5 MG chewable tablet Chew 5 mg by mouth as needed.       . Thyroid 81.25 MG TABS Take 81.25 mg by mouth. Takes 1/2 tab. Daily       No current facility-administered medications on file prior to visit.    BP 134/88  Pulse 87  Temp(Src) 98.2 F (36.8 C) (Oral)  Resp 16  Ht 5\' 4"  (1.626 m)  Wt 169 lb 6.4 oz (76.839 kg)  BMI 29.06 kg/m2  SpO2 100%  LMP 11/02/2013       Objective:   Physical  Exam  Constitutional: She is oriented to person, place, and time. She appears well-developed and well-nourished. No distress.  HENT:  Head: Normocephalic and atraumatic.  Cardiovascular: Normal rate and regular rhythm.   No murmur heard. Pulmonary/Chest: Effort normal and breath sounds normal. No respiratory distress. She has no wheezes. She has no rales. She exhibits no tenderness.  Abdominal: Soft. Bowel sounds are normal. She exhibits no distension and no mass. There is no tenderness. There is no rebound and no guarding.  Neurological: She is alert and oriented to person, place, and time.  Skin: Skin is warm and dry.  No skin lesions noted on lips  Psychiatric: She has a normal mood and affect. Her behavior is normal. Judgment and thought content normal.          Assessment & Plan:

## 2013-11-08 NOTE — Progress Notes (Signed)
Pre visit review using our clinic review tool, if applicable. No additional management support is needed unless otherwise documented below in the visit note. 

## 2013-11-09 ENCOUNTER — Ambulatory Visit (HOSPITAL_COMMUNITY)
Admission: RE | Admit: 2013-11-09 | Discharge: 2013-11-09 | Disposition: A | Discharge status: Home / Self Care | Payer: 59 | Source: Ambulatory Visit | Attending: Endocrinology | Admitting: Endocrinology

## 2013-11-09 DIAGNOSIS — E049 Nontoxic goiter, unspecified: Secondary | ICD-10-CM | POA: Diagnosis not present

## 2013-11-11 ENCOUNTER — Ambulatory Visit (HOSPITAL_BASED_OUTPATIENT_CLINIC_OR_DEPARTMENT_OTHER): Payer: 59

## 2013-11-12 DIAGNOSIS — G43909 Migraine, unspecified, not intractable, without status migrainosus: Secondary | ICD-10-CM | POA: Insufficient documentation

## 2013-11-12 DIAGNOSIS — K589 Irritable bowel syndrome without diarrhea: Secondary | ICD-10-CM | POA: Insufficient documentation

## 2013-11-12 DIAGNOSIS — L309 Dermatitis, unspecified: Secondary | ICD-10-CM | POA: Insufficient documentation

## 2013-11-12 NOTE — Assessment & Plan Note (Signed)
Pt requests referral to GI.  Will arrange.

## 2013-11-12 NOTE — Assessment & Plan Note (Signed)
Controlled with rare use of alprazolam.

## 2013-11-12 NOTE — Assessment & Plan Note (Signed)
Lips look good today, but what she is describing sounds like dry skin/eczema. Advised use of otc hydrocortisone to affected area as needed.

## 2013-11-12 NOTE — Assessment & Plan Note (Signed)
Controlled with prn use of butalbital.

## 2013-11-22 ENCOUNTER — Encounter: Payer: Self-pay | Admitting: Family Medicine

## 2013-11-27 ENCOUNTER — Encounter: Payer: Self-pay | Admitting: Family

## 2013-11-29 ENCOUNTER — Encounter: Payer: 59 | Admitting: Family

## 2013-12-12 ENCOUNTER — Encounter: Payer: 59 | Admitting: Family

## 2014-02-03 ENCOUNTER — Ambulatory Visit (HOSPITAL_BASED_OUTPATIENT_CLINIC_OR_DEPARTMENT_OTHER): Payer: 59

## 2014-02-27 ENCOUNTER — Other Ambulatory Visit: Payer: Self-pay

## 2014-02-27 ENCOUNTER — Encounter: Payer: Self-pay | Admitting: Family Medicine

## 2014-02-27 ENCOUNTER — Telehealth: Payer: Self-pay | Admitting: Family Medicine

## 2014-02-27 DIAGNOSIS — Z1231 Encounter for screening mammogram for malignant neoplasm of breast: Secondary | ICD-10-CM

## 2014-02-27 NOTE — Telephone Encounter (Signed)
Back in 2013 she did not.  I'll take it out but fyi---- bp has been elevated every time she has been seen since.   By any chance has she checked her bp at home?

## 2014-02-27 NOTE — Telephone Encounter (Signed)
Caller name:Carolyn Relationship to patient:[facility] Can be reached:[phone] Pharmacy:  Reason for call:Patient will need labs for MRI in April due to hypertension/patient states she does not have hypertension. Please advise

## 2014-02-27 NOTE — Telephone Encounter (Signed)
Home bp have been normal--- 120s / 70s----- pt has white coat sydrome and in 2013 she was having thyroid problems that affected her bp.

## 2014-03-12 ENCOUNTER — Encounter (HOSPITAL_BASED_OUTPATIENT_CLINIC_OR_DEPARTMENT_OTHER): Payer: Self-pay

## 2014-03-12 ENCOUNTER — Emergency Department (HOSPITAL_BASED_OUTPATIENT_CLINIC_OR_DEPARTMENT_OTHER): Payer: 59

## 2014-03-12 ENCOUNTER — Emergency Department (HOSPITAL_BASED_OUTPATIENT_CLINIC_OR_DEPARTMENT_OTHER)
Admission: EM | Admit: 2014-03-12 | Discharge: 2014-03-12 | Disposition: A | Discharge status: Home / Self Care | Payer: 59 | Attending: Emergency Medicine | Admitting: Emergency Medicine

## 2014-03-12 DIAGNOSIS — F410 Panic disorder [episodic paroxysmal anxiety] without agoraphobia: Secondary | ICD-10-CM | POA: Insufficient documentation

## 2014-03-12 DIAGNOSIS — Z880 Allergy status to penicillin: Secondary | ICD-10-CM | POA: Diagnosis not present

## 2014-03-12 DIAGNOSIS — Z8742 Personal history of other diseases of the female genital tract: Secondary | ICD-10-CM | POA: Diagnosis not present

## 2014-03-12 DIAGNOSIS — Z79899 Other long term (current) drug therapy: Secondary | ICD-10-CM | POA: Diagnosis not present

## 2014-03-12 DIAGNOSIS — Z8719 Personal history of other diseases of the digestive system: Secondary | ICD-10-CM | POA: Diagnosis not present

## 2014-03-12 DIAGNOSIS — I10 Essential (primary) hypertension: Secondary | ICD-10-CM | POA: Diagnosis not present

## 2014-03-12 DIAGNOSIS — Z8589 Personal history of malignant neoplasm of other organs and systems: Secondary | ICD-10-CM | POA: Insufficient documentation

## 2014-03-12 DIAGNOSIS — R011 Cardiac murmur, unspecified: Secondary | ICD-10-CM | POA: Diagnosis not present

## 2014-03-12 DIAGNOSIS — Z8619 Personal history of other infectious and parasitic diseases: Secondary | ICD-10-CM | POA: Insufficient documentation

## 2014-03-12 DIAGNOSIS — E039 Hypothyroidism, unspecified: Secondary | ICD-10-CM | POA: Insufficient documentation

## 2014-03-12 DIAGNOSIS — R51 Headache: Secondary | ICD-10-CM | POA: Diagnosis present

## 2014-03-12 LAB — COMPREHENSIVE METABOLIC PANEL
ALT: 10 U/L (ref 0–35)
AST: 17 U/L (ref 0–37)
Albumin: 4.3 g/dL (ref 3.5–5.2)
Alkaline Phosphatase: 64 U/L (ref 39–117)
Anion gap: 4 — ABNORMAL LOW (ref 5–15)
BUN: 10 mg/dL (ref 6–23)
CO2: 27 mmol/L (ref 19–32)
Calcium: 9.4 mg/dL (ref 8.4–10.5)
Chloride: 107 mmol/L (ref 96–112)
Creatinine, Ser: 0.69 mg/dL (ref 0.50–1.10)
GFR calc Af Amer: >90 mL/min (ref >90)
GFR calc non Af Amer: >90 mL/min (ref >90)
Glucose, Bld: 112 mg/dL — ABNORMAL HIGH (ref 70–99)
Potassium: 4.1 mmol/L (ref 3.5–5.1)
Sodium: 138 mmol/L (ref 135–145)
Total Bilirubin: 0.5 mg/dL (ref 0.3–1.2)
Total Protein: 7.6 g/dL (ref 6.0–8.3)

## 2014-03-12 LAB — CBC WITH DIFFERENTIAL/PLATELET
Basophils Absolute: 0 10*3/uL (ref 0.0–0.1)
Basophils Relative: 0 % (ref 0–1)
Eosinophils Absolute: 0 10*3/uL (ref 0.0–0.7)
Eosinophils Relative: 0 % (ref 0–5)
HCT: 38.3 % (ref 36.0–46.0)
Hemoglobin: 12.3 g/dL (ref 12.0–15.0)
Lymphocytes Relative: 13 % (ref 12–46)
Lymphs Abs: 1.2 10*3/uL (ref 0.7–4.0)
MCH: 24.9 pg — ABNORMAL LOW (ref 26.0–34.0)
MCHC: 32.1 g/dL (ref 30.0–36.0)
MCV: 77.7 fL — ABNORMAL LOW (ref 78.0–100.0)
Monocytes Absolute: 0.5 10*3/uL (ref 0.1–1.0)
Monocytes Relative: 6 % (ref 3–12)
Neutro Abs: 7.2 10*3/uL (ref 1.7–7.7)
Neutrophils Relative %: 81 % — ABNORMAL HIGH (ref 43–77)
Platelets: 216 10*3/uL (ref 150–400)
RBC: 4.93 MIL/uL (ref 3.87–5.11)
RDW: 15.4 % (ref 11.5–15.5)
WBC: 8.9 10*3/uL (ref 4.0–10.5)

## 2014-03-12 LAB — TSH: TSH: 2.137 u[IU]/mL (ref 0.350–4.500)

## 2014-03-12 MED ORDER — HYDROCHLOROTHIAZIDE 12.5 MG PO TABS: 12.5000 mg | ORAL_TABLET | Freq: Every day | ORAL | Status: DC

## 2014-03-12 NOTE — ED Notes (Signed)
Pt reports no hx of HBP but over last 4 days has had BP 150/100.  Pt reports having headache, trouble focusing eyes and generalized weakness.

## 2014-03-12 NOTE — ED Provider Notes (Signed)
CSN: [id number]     Arrival date & time 03/12/14  9629 History   First MD Initiated Contact with Patient 03/12/14 [phone]     Chief Complaint  Patient presents with  . Headache     (Consider location/radiation/quality/duration/timing/severity/associated sxs/prior Treatment) Patient is a 39 y.o. female presenting with headaches. The history is provided by the patient. No language interpreter was used.  Headache Pain location:  Generalized Quality:  Sharp Radiates to:  Does not radiate Severity currently:  7/10 Onset quality:  Gradual Timing:  Constant Progression:  Worsening Chronicity:  New Similar to prior headaches: no   Context: exposure to cold air   Relieved by:  Nothing Worsened by:  Nothing Ineffective treatments:  None tried Associated symptoms: no focal weakness and no sinus pressure   Risk factors: no anger     Past Medical History  Diagnosis Date  . Migraines   . GERD (gastroesophageal reflux disease)   . Heart murmur     as a child  . Hepatitis A   . Hypothyroid   . Complication of anesthesia     bp dropped with epidural  . Contraception     husband vasectomy  . Anxiety 2012  . Panic disorder 2012  . Dysmenorrhea   . Abnormal Pap smear of cervix 2007    CIN 2 + HRHPV/ LEEP  . Vertigo   . Hemangioma, intracranial structures 2013    Left Frontal Lobe   Past Surgical History  Procedure Laterality Date  . Tonsillectomy and adenoidectomy  1988  . Dilation and curettage of uterus    . Cervical biopsy  w/ loop electrode excision  2007    CIN 2  . Colposcopy  2007    CIN 2/ HRHPV   Family History  Problem Relation Age of Onset  . Prostate cancer Father   . Hypertension Father   . Alcohol abuse Father   . Heart disease Father   . Hyperlipidemia Mother   . Hypertension Brother   . Alcohol abuse Paternal Grandfather   . Heart disease Paternal Grandfather   . Breast cancer Maternal Aunt   . Depression Maternal Aunt   . Depression Maternal Uncle   .  Heart disease Paternal Aunt   . Dementia Maternal Grandmother   . Heart disease Maternal Grandfather   . Heart disease Paternal Grandmother   . Depression Maternal Aunt    History  Substance Use Topics  . Smoking status: Never Smoker   . Smokeless tobacco: Never Used  . Alcohol Use: 0.5 oz/week    1 drink(s) per week     Comment: 1-3 glasses of wine a week   OB History    Gravida Para Term Preterm AB TAB SAB Ectopic Multiple Living   5 3   2 1 1   3      Review of Systems  HENT: Negative for sinus pressure.   Neurological: Positive for headaches. Negative for focal weakness.  All other systems reviewed and are negative.     Allergies  Penicillins  Home Medications   Prior to Admission medications   Medication Sig Start Date End Date Taking? Authorizing Provider  Thyroid 81.25 MG TABS Take by mouth.   Yes Historical Provider, MD  ACETAMINOPHEN-CAFF-BUTALBITAL 50-500-40 MG CAPS 1/2 tab as needed every 8 hours as needed 11/08/13   Debbrah Alar, NP  ALPRAZolam Duanne Moron) 0.25 MG tablet Anxiety. Take 1/2 tablet twice daily if needed for anxiety. 11/08/13   Debbrah Alar, NP  Cholecalciferol (VITAMIN  D PO) Take 500 Units by mouth daily.    Historical Provider, MD  loratadine (CLARITIN) 5 MG chewable tablet Chew 5 mg by mouth as needed.     Historical Provider, MD  Thyroid 81.25 MG TABS Take 81.25 mg by mouth. Takes 1/2 tab. Daily    Historical Provider, MD   BP 156/99 mmHg  Pulse 84  Temp(Src) 98.3 F (36.8 C) (Oral)  Resp 16  Ht 5\' 4"  (1.626 m)  Wt 170 lb (77.111 kg)  BMI 29.17 kg/m2  SpO2 100%  LMP 02/12/2014 Physical Exam  Constitutional: She is oriented to person, place, and time. She appears well-developed and well-nourished.  HENT:  Head: Normocephalic.  Right Ear: External ear normal.  Left Ear: External ear normal.  Nose: Nose normal.  Mouth/Throat: Oropharynx is clear and moist.  Eyes: EOM are normal. Pupils are equal, round, and reactive to light.   Neck: Normal range of motion.  Cardiovascular: Normal rate and normal heart sounds.   Pulmonary/Chest: Effort normal.  Abdominal: She exhibits no distension.  Musculoskeletal: Normal range of motion.  Neurological: She is alert and oriented to person, place, and time.  Psychiatric: She has a normal mood and affect.  Nursing note and vitals reviewed.   ED Course  Procedures (including critical care time) Labs Review Labs Reviewed  CBC WITH DIFFERENTIAL/PLATELET - Abnormal; Notable for the following:    MCV 77.7 (*)    MCH 24.9 (*)    Neutrophils Relative % 81 (*)    All other components within normal limits  COMPREHENSIVE METABOLIC PANEL - Abnormal; Notable for the following:    Glucose, Bld 112 (*)    Anion gap 4 (*)    All other components within normal limits  TSH    Imaging Review Ct Head Wo Contrast  03/12/2014   CLINICAL DATA:  Two day history of headaches  EXAM: CT HEAD WITHOUT CONTRAST  TECHNIQUE: Contiguous axial images were obtained from the base of the skull through the vertex without intravenous contrast.  COMPARISON:  Head CT December 21, 2011 and brain MRI December 21, 2011  FINDINGS: The ventricles are normal in size and configuration. A previously documented 1.0 x 0.9 cm cavernous hemangioma in the posterior left frontal lobe at the level of the superior lateral ventricles remains stable. There is increased attenuation in this structure. No new foci of mass or hemorrhage seen. There is no subdural or epidural fluid collection. No midline shift. Elsewhere, gray-white compartments appear normal. There is no demonstrable acute infarct. Bony calvarium appears intact. The mastoid air cells are clear.  IMPRESSION: Small stable cavernous hemangioma posterior left frontal lobe. No surrounding edema in this area. Noted other focal intracranial lesion identified. No appreciable change from prior studies.   Electronically Signed   By: Lowella Grip III M.D.   On: 03/12/2014  10:53     EKG Interpretation   Date/Time:  Monday March 12 2014 10:37:34 EST Ventricular Rate:  87 PR Interval:  134 QRS Duration: 82 QT Interval:  348 QTC Calculation: 418 R Axis:   63 Text Interpretation:  Normal sinus rhythm with sinus arrhythmia Normal ECG  No significant change since last tracing Confirmed by Debby Freiberg  [phone]) on 03/12/2014 10:58:59 AM     Results for orders placed or performed during the hospital encounter of 03/12/14  CBC with Differential/Platelet  Result Value Ref Range   WBC 8.9 4.0 - 10.5 K/uL   RBC 4.93 3.87 - 5.11 MIL/uL   Hemoglobin 12.3  12.0 - 15.0 g/dL   HCT 38.3 36.0 - 46.0 %   MCV 77.7 (L) 78.0 - 100.0 fL   MCH 24.9 (L) 26.0 - 34.0 pg   MCHC 32.1 30.0 - 36.0 g/dL   RDW 15.4 11.5 - 15.5 %   Platelets 216 150 - 400 K/uL   Neutrophils Relative % 81 (H) 43 - 77 %   Neutro Abs 7.2 1.7 - 7.7 K/uL   Lymphocytes Relative 13 12 - 46 %   Lymphs Abs 1.2 0.7 - 4.0 K/uL   Monocytes Relative 6 3 - 12 %   Monocytes Absolute 0.5 0.1 - 1.0 K/uL   Eosinophils Relative 0 0 - 5 %   Eosinophils Absolute 0.0 0.0 - 0.7 K/uL   Basophils Relative 0 0 - 1 %   Basophils Absolute 0.0 0.0 - 0.1 K/uL  Comprehensive metabolic panel  Result Value Ref Range   Sodium 138 135 - 145 mmol/L   Potassium 4.1 3.5 - 5.1 mmol/L   Chloride 107 96 - 112 mmol/L   CO2 27 19 - 32 mmol/L   Glucose, Bld 112 (H) 70 - 99 mg/dL   BUN 10 6 - 23 mg/dL   Creatinine, Ser 0.69 0.50 - 1.10 mg/dL   Calcium 9.4 8.4 - 10.5 mg/dL   Total Protein 7.6 6.0 - 8.3 g/dL   Albumin 4.3 3.5 - 5.2 g/dL   AST 17 0 - 37 U/L   ALT 10 0 - 35 U/L   Alkaline Phosphatase 64 39 - 117 U/L   Total Bilirubin 0.5 0.3 - 1.2 mg/dL   GFR calc non Af Amer >90 >90 mL/min   GFR calc Af Amer >90 >90 mL/min   Anion gap 4 (L) 5 - 15   Ct Head Wo Contrast  03/12/2014   CLINICAL DATA:  Two day history of headaches  EXAM: CT HEAD WITHOUT CONTRAST  TECHNIQUE: Contiguous axial images were obtained from the  base of the skull through the vertex without intravenous contrast.  COMPARISON:  Head CT December 21, 2011 and brain MRI December 21, 2011  FINDINGS: The ventricles are normal in size and configuration. A previously documented 1.0 x 0.9 cm cavernous hemangioma in the posterior left frontal lobe at the level of the superior lateral ventricles remains stable. There is increased attenuation in this structure. No new foci of mass or hemorrhage seen. There is no subdural or epidural fluid collection. No midline shift. Elsewhere, gray-white compartments appear normal. There is no demonstrable acute infarct. Bony calvarium appears intact. The mastoid air cells are clear.  IMPRESSION: Small stable cavernous hemangioma posterior left frontal lobe. No surrounding edema in this area. Noted other focal intracranial lesion identified. No appreciable change from prior studies.   Electronically Signed   By: Lowella Grip III M.D.   On: 03/12/2014 10:53    MDM  Pt looks good overall.   I will start hctz at low dose.   Pt advised to see Dr. Etter Sjogren for recheck   Final diagnoses:  Essential hypertension        Fransico Meadow, PA-C 03/12/14 [place], MD 03/13/14 [phone]

## 2014-03-12 NOTE — Discharge Instructions (Signed)
Hypertension °Hypertension, commonly called high blood pressure, is when the force of blood pumping through your arteries is too strong. Your arteries are the blood vessels that carry blood from your heart throughout your body. A blood pressure reading consists of a higher number over a lower number, such as 110/72. The higher number (systolic) is the pressure inside your arteries when your heart pumps. The lower number (diastolic) is the pressure inside your arteries when your heart relaxes. Ideally you want your blood pressure below 120/80. °Hypertension forces your heart to work harder to pump blood. Your arteries may become narrow or stiff. Having hypertension puts you at risk for heart disease, stroke, and other problems.  °RISK FACTORS °Some risk factors for high blood pressure are controllable. Others are not.  °Risk factors you cannot control include:  °· Race. You may be at higher risk if you are African American. °· Age. Risk increases with age. °· Gender. Men are at higher risk than women before age 45 years. After age 65, women are at higher risk than men. °Risk factors you can control include: °· Not getting enough exercise or physical activity. °· Being overweight. °· Getting too much fat, sugar, calories, or salt in your diet. °· Drinking too much alcohol. °SIGNS AND SYMPTOMS °Hypertension does not usually cause signs or symptoms. Extremely high blood pressure (hypertensive crisis) may cause headache, anxiety, shortness of breath, and nosebleed. °DIAGNOSIS  °To check if you have hypertension, your health care provider will measure your blood pressure while you are seated, with your arm held at the level of your heart. It should be measured at least twice using the same arm. Certain conditions can cause a difference in blood pressure between your right and left arms. A blood pressure reading that is higher than normal on one occasion does not mean that you need treatment. If one blood pressure reading  is high, ask your health care provider about having it checked again. °TREATMENT  °Treating high blood pressure includes making lifestyle changes and possibly taking medicine. Living a healthy lifestyle can help lower high blood pressure. You may need to change some of your habits. °Lifestyle changes may include: °· Following the DASH diet. This diet is high in fruits, vegetables, and whole grains. It is low in salt, red meat, and added sugars. °· Getting at least 2½ hours of brisk physical activity every week. °· Losing weight if necessary. °· Not smoking. °· Limiting alcoholic beverages. °· Learning ways to reduce stress. ° If lifestyle changes are not enough to get your blood pressure under control, your health care provider may prescribe medicine. You may need to take more than one. Work closely with your health care provider to understand the risks and benefits. °HOME CARE INSTRUCTIONS °· Have your blood pressure rechecked as directed by your health care provider.   °· Take medicines only as directed by your health care provider. Follow the directions carefully. Blood pressure medicines must be taken as prescribed. The medicine does not work as well when you skip doses. Skipping doses also puts you at risk for problems.   °· Do not smoke.   °· Monitor your blood pressure at home as directed by your health care provider.  °SEEK MEDICAL CARE IF:  °· You think you are having a reaction to medicines taken. °· You have recurrent headaches or feel dizzy. °· You have swelling in your ankles. °· You have trouble with your vision. °SEEK IMMEDIATE MEDICAL CARE IF: °· You develop a severe headache or confusion. °·   You have unusual weakness, numbness, or feel faint.  You have severe chest or abdominal pain.  You vomit repeatedly.  You have trouble breathing. MAKE SURE YOU:   Understand these instructions.  Will watch your condition.  Will get help right away if you are not doing well or get worse. Document  Released: 01/12/2005 Document Revised: 05/29/2013 Document Reviewed: 11/04/2012 [facility] Patient Information 2015 [place], [place]. This information is not intended to replace advice given to you by your health care provider. Make sure you discuss any questions you have with your health care provider.  Hypertension Hypertension, commonly called high blood pressure, is when the force of blood pumping through your arteries is too strong. Your arteries are the blood vessels that carry blood from your heart throughout your body. A blood pressure reading consists of a higher number over a lower number, such as 110/72. The higher number (systolic) is the pressure inside your arteries when your heart pumps. The lower number (diastolic) is the pressure inside your arteries when your heart relaxes. Ideally you want your blood pressure below 120/80. Hypertension forces your heart to work harder to pump blood. Your arteries may become narrow or stiff. Having hypertension puts you at risk for heart disease, stroke, and other problems.  RISK FACTORS Some risk factors for high blood pressure are controllable. Others are not.  Risk factors you cannot control include:   Race. You may be at higher risk if you are African American.  Age. Risk increases with age.  Gender. Men are at higher risk than women before age 64 years. After age 18, women are at higher risk than men. Risk factors you can control include:  Not getting enough exercise or physical activity.  Being overweight.  Getting too much fat, sugar, calories, or salt in your diet.  Drinking too much alcohol. SIGNS AND SYMPTOMS Hypertension does not usually cause signs or symptoms. Extremely high blood pressure (hypertensive crisis) may cause headache, anxiety, shortness of breath, and nosebleed. DIAGNOSIS  To check if you have hypertension, your health care provider will measure your blood pressure while you are seated, with your arm held at the  level of your heart. It should be measured at least twice using the same arm. Certain conditions can cause a difference in blood pressure between your right and left arms. A blood pressure reading that is higher than normal on one occasion does not mean that you need treatment. If one blood pressure reading is high, ask your health care provider about having it checked again. TREATMENT  Treating high blood pressure includes making lifestyle changes and possibly taking medicine. Living a healthy lifestyle can help lower high blood pressure. You may need to change some of your habits. Lifestyle changes may include:  Following the DASH diet. This diet is high in fruits, vegetables, and whole grains. It is low in salt, red meat, and added sugars.  Getting at least 2 hours of brisk physical activity every week.  Losing weight if necessary.  Not smoking.  Limiting alcoholic beverages.  Learning ways to reduce stress. If lifestyle changes are not enough to get your blood pressure under control, your health care provider may prescribe medicine. You may need to take more than one. Work closely with your health care provider to understand the risks and benefits. HOME CARE INSTRUCTIONS  Have your blood pressure rechecked as directed by your health care provider.   Take medicines only as directed by your health care provider. Follow the directions carefully.  Blood pressure medicines must be taken as prescribed. The medicine does not work as well when you skip doses. Skipping doses also puts you at risk for problems.   Do not smoke.   Monitor your blood pressure at home as directed by your health care provider. SEEK MEDICAL CARE IF:   You think you are having a reaction to medicines taken.  You have recurrent headaches or feel dizzy.  You have swelling in your ankles.  You have trouble with your vision. SEEK IMMEDIATE MEDICAL CARE IF:  You develop a severe headache or confusion.  You  have unusual weakness, numbness, or feel faint.  You have severe chest or abdominal pain.  You vomit repeatedly.  You have trouble breathing. MAKE SURE YOU:   Understand these instructions.  Will watch your condition.  Will get help right away if you are not doing well or get worse. Document Released: 01/12/2005 Document Revised: 05/29/2013 Document Reviewed: 11/04/2012 [facility] Patient Information 2015 [place], [place]. This information is not intended to replace advice given to you by your health care provider. Make sure you discuss any questions you have with your health care provider.

## 2014-03-13 ENCOUNTER — Ambulatory Visit (INDEPENDENT_AMBULATORY_CARE_PROVIDER_SITE_OTHER): Payer: 59 | Admitting: Family Medicine

## 2014-03-13 ENCOUNTER — Telehealth: Payer: Self-pay | Admitting: Family Medicine

## 2014-03-13 ENCOUNTER — Encounter: Payer: Self-pay | Admitting: Family Medicine

## 2014-03-13 VITALS — BP 132/80 | HR 80 | Temp 98.2°F | Wt 164.8 lb

## 2014-03-13 DIAGNOSIS — N951 Menopausal and female climacteric states: Secondary | ICD-10-CM

## 2014-03-13 DIAGNOSIS — R030 Elevated blood-pressure reading, without diagnosis of hypertension: Secondary | ICD-10-CM

## 2014-03-13 DIAGNOSIS — E039 Hypothyroidism, unspecified: Secondary | ICD-10-CM

## 2014-03-13 DIAGNOSIS — IMO0001 Reserved for inherently not codable concepts without codable children: Secondary | ICD-10-CM | POA: Insufficient documentation

## 2014-03-13 DIAGNOSIS — R232 Flushing: Secondary | ICD-10-CM | POA: Insufficient documentation

## 2014-03-13 NOTE — Progress Notes (Signed)
Subjective:    Patient ID: Emily Barker, female    DOB: 1975-10-14, 39 y.o.   MRN: [id number]  HPI  Patient here c/o hot flashes with HCTZ.-- she took one dose and next day had hot flashes for 12 -15 hour.  She has been fine since this am.    Past Medical History  Diagnosis Date  . Migraines   . GERD (gastroesophageal reflux disease)   . Heart murmur     as a child  . Hepatitis A   . Hypothyroid   . Complication of anesthesia     bp dropped with epidural  . Contraception     husband vasectomy  . Anxiety 2012  . Panic disorder 2012  . Dysmenorrhea   . Abnormal Pap smear of cervix 2007    CIN 2 + HRHPV/ LEEP  . Vertigo   . Hemangioma, intracranial structures 2013    Left Frontal Lobe    Review of Systems  Constitutional: Negative for activity change, appetite change, fatigue and unexpected weight change.  Respiratory: Negative for cough and shortness of breath.   Cardiovascular: Negative for chest pain and palpitations.  Endocrine: Negative for cold intolerance.       Hot flashes  Psychiatric/Behavioral: Negative for behavioral problems and dysphoric mood. The patient is not nervous/anxious.        Objective:    Physical Exam  Constitutional: She is oriented to person, place, and time. She appears well-developed and well-nourished. No distress.  HENT:  Right Ear: External ear normal.  Left Ear: External ear normal.  Nose: Nose normal.  Mouth/Throat: Oropharynx is clear and moist.  Eyes: EOM are normal. Pupils are equal, round, and reactive to light.  Neck: Normal range of motion. Neck supple.  Cardiovascular: Normal rate, regular rhythm and normal heart sounds.   No murmur heard. Pulmonary/Chest: Effort normal and breath sounds normal. No respiratory distress. She has no wheezes. She has no rales. She exhibits no tenderness.  Neurological: She is alert and oriented to person, place, and time.  Psychiatric: She has a normal mood and affect. Her behavior is  normal. Judgment and thought content normal.    BP 132/80 mmHg  Pulse 80  Temp(Src) 98.2 F (36.8 C) (Oral)  Wt 164 lb 12.8 oz (74.753 kg)  SpO2 98%  LMP 02/12/2014 Wt Readings from Last 3 Encounters:  03/13/14 164 lb 12.8 oz (74.753 kg)  03/12/14 170 lb (77.111 kg)  11/08/13 169 lb 6.4 oz (76.839 kg)     Lab Results  Component Value Date   WBC 8.9 03/12/2014   HGB 12.3 03/12/2014   HCT 38.3 03/12/2014   PLT 216 03/12/2014   GLUCOSE 112* 03/12/2014   ALT 10 03/12/2014   AST 17 03/12/2014   NA 138 03/12/2014   K 4.1 03/12/2014   CL 107 03/12/2014   CREATININE 0.69 03/12/2014   BUN 10 03/12/2014   CO2 27 03/12/2014   TSH 2.137 03/12/2014    Ct Head Wo Contrast  03/12/2014   CLINICAL DATA:  Two day history of headaches  EXAM: CT HEAD WITHOUT CONTRAST  TECHNIQUE: Contiguous axial images were obtained from the base of the skull through the vertex without intravenous contrast.  COMPARISON:  Head CT December 21, 2011 and brain MRI December 21, 2011  FINDINGS: The ventricles are normal in size and configuration. A previously documented 1.0 x 0.9 cm cavernous hemangioma in the posterior left frontal lobe at the level of the superior lateral ventricles remains stable.  There is increased attenuation in this structure. No new foci of mass or hemorrhage seen. There is no subdural or epidural fluid collection. No midline shift. Elsewhere, gray-white compartments appear normal. There is no demonstrable acute infarct. Bony calvarium appears intact. The mastoid air cells are clear.  IMPRESSION: Small stable cavernous hemangioma posterior left frontal lobe. No surrounding edema in this area. Noted other focal intracranial lesion identified. No appreciable change from prior studies.   Electronically Signed   By: Lowella Grip III M.D.   On: 03/12/2014 10:53   ekg done is er--- er record, labs etc reviewed    Assessment & Plan:   Problem List Items Addressed This Visit    Hypothyroidism -  Primary   Relevant Orders   Thyroid Panel With TSH   Thyroid antibodies   Hot flashes    ? If it was actually the HCTZ or hormonal Symptoms are now resolved      Elevated BP    Pt went to er because of elevated bp--- she was put on HcTz and told to f/u here bp is lower off med now con't to follow          Garnet Koyanagi, DO

## 2014-03-13 NOTE — Telephone Encounter (Signed)
Dr.Lowne please advise      KP 

## 2014-03-13 NOTE — Patient Instructions (Signed)
Hypothyroidism The thyroid is a large gland located in the lower front of your neck. The thyroid gland helps control metabolism. Metabolism is how your body handles food. It controls metabolism with the hormone thyroxine. When this gland is underactive (hypothyroid), it produces too little hormone.  CAUSES These include:   Absence or destruction of thyroid tissue.  Goiter due to iodine deficiency.  Goiter due to medications.  Congenital defects (since birth).  Problems with the pituitary. This causes a lack of TSH (thyroid stimulating hormone). This hormone tells the thyroid to turn out more hormone. SYMPTOMS  Lethargy (feeling as though you have no energy)  Cold intolerance  Weight gain (in spite of normal food intake)  Dry skin  Coarse hair  Menstrual irregularity (if severe, may lead to infertility)  Slowing of thought processes Cardiac problems are also caused by insufficient amounts of thyroid hormone. Hypothyroidism in the newborn is cretinism, and is an extreme form. It is important that this form be treated adequately and immediately or it will lead rapidly to retarded physical and mental development. DIAGNOSIS  To prove hypothyroidism, your caregiver may do blood tests and ultrasound tests. Sometimes the signs are hidden. It may be necessary for your caregiver to watch this illness with blood tests either before or after diagnosis and treatment. TREATMENT  Low levels of thyroid hormone are increased by using synthetic thyroid hormone. This is a safe, effective treatment. It usually takes about four weeks to gain the full effects of the medication. After you have the full effect of the medication, it will generally take another four weeks for problems to leave. Your caregiver may start you on low doses. If you have had heart problems the dose may be gradually increased. It is generally not an emergency to get rapidly to normal. HOME CARE INSTRUCTIONS   Take your  medications as your caregiver suggests. Let your caregiver know of any medications you are taking or start taking. Your caregiver will help you with dosage schedules.  As your condition improves, your dosage needs may increase. It will be necessary to have continuing blood tests as suggested by your caregiver.  Report all suspected medication side effects to your caregiver. SEEK MEDICAL CARE IF: Seek medical care if you develop:  Sweating.  Tremulousness (tremors).  Anxiety.  Rapid weight loss.  Heat intolerance.  Emotional swings.  Diarrhea.  Weakness. SEEK IMMEDIATE MEDICAL CARE IF:  You develop chest pain, an irregular heart beat (palpitations), or a rapid heart beat. MAKE SURE YOU:   Understand these instructions.  Will watch your condition.  Will get help right away if you are not doing well or get worse. Document Released: 01/12/2005 Document Revised: 04/06/2011 Document Reviewed: 09/02/2007 [employer] Patient Information 2015 [employer]. This information is not intended to replace advice given to you by your health care provider. Make sure you discuss any questions you have with your health care provider.  

## 2014-03-13 NOTE — Telephone Encounter (Signed)
She needs ov-- can she not come in tonight

## 2014-03-13 NOTE — Assessment & Plan Note (Signed)
Pt went to er because of elevated bp--- she was put on HcTz and told to f/u here bp is lower off med now con't to follow

## 2014-03-13 NOTE — Assessment & Plan Note (Signed)
?   If it was actually the HCTZ or hormonal Symptoms are now resolved

## 2014-03-13 NOTE — Telephone Encounter (Signed)
Patient went to ed yesterday for high bp was given a diuretic.  She is feeling dizzy and having hot flashes this morning.  Tried to work her in this afternoon but that did not suit her schedule  Please advise time for ed follow up

## 2014-03-13 NOTE — Progress Notes (Signed)
Pre visit review using our clinic review tool, if applicable. No additional management support is needed unless otherwise documented below in the visit note. 

## 2014-03-14 ENCOUNTER — Encounter: Payer: Self-pay | Admitting: Family Medicine

## 2014-03-15 ENCOUNTER — Other Ambulatory Visit: Payer: Self-pay | Admitting: Family Medicine

## 2014-03-15 ENCOUNTER — Telehealth: Payer: Self-pay | Admitting: Family Medicine

## 2014-03-15 DIAGNOSIS — I10 Essential (primary) hypertension: Secondary | ICD-10-CM

## 2014-03-15 MED ORDER — LOSARTAN POTASSIUM 25 MG PO TABS: 25.0000 mg | ORAL_TABLET | Freq: Every day | ORAL | Status: DC

## 2014-03-15 NOTE — Telephone Encounter (Signed)
See my chart message

## 2014-03-15 NOTE — Telephone Encounter (Signed)
Send losartan 25 mg 1 po qd #30

## 2014-03-15 NOTE — Telephone Encounter (Signed)
Caller name: Josseline, [place] Relation to pt: self  Call back number: [phone]   Reason for call:  Pt states ED prescribed hydrochlorothiazide 03/12/14 and pt mother informed pt that her brother is allergic to sulfate which is in hydrochlorothiazide and its a possibility that pt may be allergic as well. Pt inquiring about  lisinopril and would like to know what MD thinks. Please advise.

## 2014-03-15 NOTE — Telephone Encounter (Signed)
Please advise      KP 

## 2014-03-16 NOTE — Telephone Encounter (Signed)
I already answered this yesterday--- no HCTZ Start losartan 25 mg daily Ov 2-3 weeks

## 2014-03-20 ENCOUNTER — Ambulatory Visit (INDEPENDENT_AMBULATORY_CARE_PROVIDER_SITE_OTHER): Payer: 59 | Admitting: Physician Assistant

## 2014-03-20 ENCOUNTER — Encounter: Payer: Self-pay | Admitting: Physician Assistant

## 2014-03-20 ENCOUNTER — Telehealth: Payer: Self-pay

## 2014-03-20 ENCOUNTER — Telehealth: Payer: Self-pay | Admitting: Family Medicine

## 2014-03-20 VITALS — BP 124/82 | HR 88 | Temp 98.7°F | Resp 16 | Ht 64.0 in | Wt 163.4 lb

## 2014-03-20 DIAGNOSIS — IMO0001 Reserved for inherently not codable concepts without codable children: Secondary | ICD-10-CM

## 2014-03-20 DIAGNOSIS — R030 Elevated blood-pressure reading, without diagnosis of hypertension: Secondary | ICD-10-CM

## 2014-03-20 NOTE — Telephone Encounter (Signed)
Chief Complaint:  Blood Pressure High Initial Comment:  Caller has BP 160/95; and lt arm pain  PreDisposition:  Go to ER  Reason for call:  Call has BP 160/95 about an hour ago; and Lt arm pain is intermittent, annoying ache.  Disposition:  See PCP When Office is Open (within 3 days).    Pt has appointment scheduled today (03/20/14) at 11 am with Elyn Aquas, PA-C.

## 2014-03-20 NOTE — Assessment & Plan Note (Signed)
Blood pressure normotensive in clinic. Patient endorses taking medication as directed. Multiple home blood pressure measurements revealing fat blood pressure is above goal. We'll initiate 1.5 tablets of losartan daily. This will be a total of 37.5 mg. Continue diet and exercise. Follow-up with PCP in 1-2 weeks for BP reassessment.

## 2014-03-20 NOTE — Patient Instructions (Addendum)
Please take 1.5 tablets of the losartan. Continue your good diet and exercise regimen. Schedule a follow-up with Dr. Etter Sjogren in the next 2 weeks.  If your BP gets above 160/90 consistently with medication, it is ok to take 2 tablets instead of 1.5 tablets.  If you have BP over 170/110 and it is not coming down with medication, or if you develop severe headache, lightheadedness or chest pain, please call 911 or go to the ER.  Hypertension Hypertension, commonly called high blood pressure, is when the force of blood pumping through your arteries is too strong. Your arteries are the blood vessels that carry blood from your heart throughout your body. A blood pressure reading consists of a higher number over a lower number, such as 110/72. The higher number (systolic) is the pressure inside your arteries when your heart pumps. The lower number (diastolic) is the pressure inside your arteries when your heart relaxes. Ideally you want your blood pressure below 120/80. Hypertension forces your heart to work harder to pump blood. Your arteries may become narrow or stiff. Having hypertension puts you at risk for heart disease, stroke, and other problems.  RISK FACTORS Some risk factors for high blood pressure are controllable. Others are not.  Risk factors you cannot control include:   Race. You may be at higher risk if you are African American.  Age. Risk increases with age.  Gender. Men are at higher risk than women before age 12 years. After age 79, women are at higher risk than men. Risk factors you can control include:  Not getting enough exercise or physical activity.  Being overweight.  Getting too much fat, sugar, calories, or salt in your diet.  Drinking too much alcohol. SIGNS AND SYMPTOMS Hypertension does not usually cause signs or symptoms. Extremely high blood pressure (hypertensive crisis) may cause headache, anxiety, shortness of breath, and nosebleed. DIAGNOSIS  To check if you  have hypertension, your health care provider will measure your blood pressure while you are seated, with your arm held at the level of your heart. It should be measured at least twice using the same arm. Certain conditions can cause a difference in blood pressure between your right and left arms. A blood pressure reading that is higher than normal on one occasion does not mean that you need treatment. If one blood pressure reading is high, ask your health care provider about having it checked again. TREATMENT  Treating high blood pressure includes making lifestyle changes and possibly taking medicine. Living a healthy lifestyle can help lower high blood pressure. You may need to change some of your habits. Lifestyle changes may include:  Following the DASH diet. This diet is high in fruits, vegetables, and whole grains. It is low in salt, red meat, and added sugars.  Getting at least 2 hours of brisk physical activity every week.  Losing weight if necessary.  Not smoking.  Limiting alcoholic beverages.  Learning ways to reduce stress. If lifestyle changes are not enough to get your blood pressure under control, your health care provider may prescribe medicine. You may need to take more than one. Work closely with your health care provider to understand the risks and benefits. HOME CARE INSTRUCTIONS  Have your blood pressure rechecked as directed by your health care provider.   Take medicines only as directed by your health care provider. Follow the directions carefully. Blood pressure medicines must be taken as prescribed. The medicine does not work as well when you skip doses.  Skipping doses also puts you at risk for problems.   Do not smoke.   Monitor your blood pressure at home as directed by your health care provider. SEEK MEDICAL CARE IF:   You think you are having a reaction to medicines taken.  You have recurrent headaches or feel dizzy.  You have swelling in your  ankles.  You have trouble with your vision. SEEK IMMEDIATE MEDICAL CARE IF:  You develop a severe headache or confusion.  You have unusual weakness, numbness, or feel faint.  You have severe chest or abdominal pain.  You vomit repeatedly.  You have trouble breathing. MAKE SURE YOU:   Understand these instructions.  Will watch your condition.  Will get help right away if you are not doing well or get worse. Document Released: 01/12/2005 Document Revised: 05/29/2013 Document Reviewed: 11/04/2012 [facility] Patient Information 2015 [place], [place]. This information is not intended to replace advice given to you by your health care provider. Make sure you discuss any questions you have with your health care provider.

## 2014-03-20 NOTE — Telephone Encounter (Signed)
error:[id number] ° °

## 2014-03-20 NOTE — Progress Notes (Signed)
  Patient presents to clinic today c/o continued elevated blood pressure measurements after starting 25 mg of losartan daily, as directed by her primary care provider. Patient has been checking her blood pressure once daily over the past 6 days. Endorses blood pressure at home ranging from 140-170/90-100.Patient denies chest pain, palpitations, lightheadedness, dizziness, vision changes or frequent headaches. Endorses low salt diet. Stays active. Denies excess caffeine intake. Is checking her blood pressure with both an automatic and manual cuff which seem to be consistent with one another.  Past Medical History  Diagnosis Date  . Migraines   . GERD (gastroesophageal reflux disease)   . Heart murmur     as a child  . Hepatitis A   . Hypothyroid   . Complication of anesthesia     bp dropped with epidural  . Contraception     husband vasectomy  . Anxiety 2012  . Panic disorder 2012  . Dysmenorrhea   . Abnormal Pap smear of cervix 2007    CIN 2 + HRHPV/ LEEP  . Vertigo   . Hemangioma, intracranial structures 2013    Left Frontal Lobe    Current Outpatient Prescriptions on File Prior to Visit  Medication Sig Dispense Refill  . ACETAMINOPHEN-CAFF-BUTALBITAL 50-500-40 MG CAPS 1/2 tab as needed every 8 hours as needed 15 each 0  . ALPRAZolam (XANAX) 0.25 MG tablet Anxiety. Take 1/2 tablet twice daily if needed for anxiety. (Patient not taking: Reported on 03/13/2014) 10 tablet 0  . Cholecalciferol (VITAMIN D PO) Take 500 Units by mouth daily.    . loratadine (CLARITIN) 5 MG chewable tablet Chew 5 mg by mouth as needed.     . losartan (COZAAR) 25 MG tablet Take 1 tablet (25 mg total) by mouth daily. 30 tablet 0  . Thyroid 81.25 MG TABS Take 81.25 mg by mouth every other day.      No current facility-administered medications on file prior to visit.    Allergies  Allergen Reactions  . Penicillins     Unknown. Other family members severely allergic. 1 family member had fatal reaction.    . Sulfa Antibiotics Other (See Comments)    Hot flashes    Family History  Problem Relation Age of Onset  . Prostate cancer Father   . Hypertension Father   . Alcohol abuse Father   . Heart disease Father   . Hyperlipidemia Mother   . Hypertension Brother   . Alcohol abuse Paternal Grandfather   . Heart disease Paternal Grandfather   . Breast cancer Maternal Aunt   . Depression Maternal Aunt   . Depression Maternal Uncle   . Heart disease Paternal Aunt   . Dementia Maternal Grandmother   . Heart disease Maternal Grandfather   . Heart disease Paternal Grandmother   . Depression Maternal Aunt     History   Social History  . Marital Status: Married    Spouse Name: N/A  . Number of Children: 3  . Years of Education: N/A   Social History Main Topics  . Smoking status: Never Smoker   . Smokeless tobacco: Never Used  . Alcohol Use: 0.5 oz/week    1 drink(s) per week     Comment: 1-3 glasses of wine a week  . Drug Use: No  . Sexual Activity:    Partners: Male    Birth Control/ Protection: Other-see comments     Comment: vasectomy   Other Topics Concern  . None   Social History Narrative     Original from [place] - See HPI.  All other ROS are negative.  BP 124/82 mmHg  Pulse 88  Temp(Src) 98.7 F (37.1 C) (Oral)  Resp 16  Ht 5' 4" (1.626 m)  Wt 163 lb 6 oz (74.106 kg)  BMI 28.03 kg/m2  SpO2 100%  LMP 03/09/2014  Physical Exam  Constitutional: She is oriented to person, place, and time and well-developed, well-nourished, and in no distress.  HENT:  Head: Normocephalic and atraumatic.  Eyes: Conjunctivae are normal.  Cardiovascular: Normal rate, regular rhythm, normal heart sounds and intact distal pulses.   Pulmonary/Chest: Effort normal and breath sounds normal. No respiratory distress. She has no wheezes. She has no rales. She exhibits no tenderness.  Neurological: She is alert and oriented to person, place, and time.  Skin: Skin is  warm and dry. No rash noted.  Psychiatric: Affect normal.  Vitals reviewed.   Recent Results (from the past 2160 hour(s))  CBC with Differential/Platelet     Status: Abnormal   Collection Time: 03/12/14  9:41 AM  Result Value Ref Range   WBC 8.9 4.0 - 10.5 K/uL   RBC 4.93 3.87 - 5.11 MIL/uL   Hemoglobin 12.3 12.0 - 15.0 g/dL   HCT 38.3 36.0 - 46.0 %   MCV 77.7 (L) 78.0 - 100.0 fL   MCH 24.9 (L) 26.0 - 34.0 pg   MCHC 32.1 30.0 - 36.0 g/dL   RDW 15.4 11.5 - 15.5 %   Platelets 216 150 - 400 K/uL   Neutrophils Relative % 81 (H) 43 - 77 %   Neutro Abs 7.2 1.7 - 7.7 K/uL   Lymphocytes Relative 13 12 - 46 %   Lymphs Abs 1.2 0.7 - 4.0 K/uL   Monocytes Relative 6 3 - 12 %   Monocytes Absolute 0.5 0.1 - 1.0 K/uL   Eosinophils Relative 0 0 - 5 %   Eosinophils Absolute 0.0 0.0 - 0.7 K/uL   Basophils Relative 0 0 - 1 %   Basophils Absolute 0.0 0.0 - 0.1 K/uL  Comprehensive metabolic panel     Status: Abnormal   Collection Time: 03/12/14  9:41 AM  Result Value Ref Range   Sodium 138 135 - 145 mmol/L   Potassium 4.1 3.5 - 5.1 mmol/L   Chloride 107 96 - 112 mmol/L   CO2 27 19 - 32 mmol/L   Glucose, Bld 112 (H) 70 - 99 mg/dL   BUN 10 6 - 23 mg/dL   Creatinine, Ser 0.69 0.50 - 1.10 mg/dL   Calcium 9.4 8.4 - 10.5 mg/dL   Total Protein 7.6 6.0 - 8.3 g/dL   Albumin 4.3 3.5 - 5.2 g/dL   AST 17 0 - 37 U/L   ALT 10 0 - 35 U/L   Alkaline Phosphatase 64 39 - 117 U/L   Total Bilirubin 0.5 0.3 - 1.2 mg/dL   GFR calc non Af Amer >90 >90 mL/min   GFR calc Af Amer >90 >90 mL/min    Comment: (NOTE) The eGFR has been calculated using the CKD EPI equation. This calculation has not been validated in all clinical situations. eGFR's persistently <90 mL/min signify possible Chronic Kidney Disease.    Anion gap 4 (L) 5 - 15  TSH     Status: None   Collection Time: 03/12/14  9:50 AM  Result Value Ref Range   TSH 2.137 0.350 - 4.500 uIU/mL    Comment: Performed at [facility]  Assessment/Plan: Elevated BP Blood pressure normotensive in clinic. Patient endorses taking medication as directed. Multiple home blood pressure measurements revealing fat blood pressure is above goal. We'll initiate 1.5 tablets of losartan daily. This will be a total of 37.5 mg. Continue diet and exercise. Follow-up with PCP in 1-2 weeks for BP reassessment.

## 2014-03-20 NOTE — Progress Notes (Signed)
Pre visit review using our clinic review tool, if applicable. No additional management support is needed unless otherwise documented below in the visit note/SLS  

## 2014-03-23 ENCOUNTER — Ambulatory Visit (INDEPENDENT_AMBULATORY_CARE_PROVIDER_SITE_OTHER): Payer: 59 | Admitting: Family Medicine

## 2014-03-23 VITALS — BP 119/80 | HR 98 | Temp 97.9°F | Resp 18 | Ht 65.0 in | Wt 164.0 lb

## 2014-03-23 DIAGNOSIS — D1800 Hemangioma unspecified site: Secondary | ICD-10-CM

## 2014-03-23 DIAGNOSIS — I10 Essential (primary) hypertension: Secondary | ICD-10-CM

## 2014-03-23 DIAGNOSIS — R0789 Other chest pain: Secondary | ICD-10-CM

## 2014-03-23 DIAGNOSIS — G441 Vascular headache, not elsewhere classified: Secondary | ICD-10-CM

## 2014-03-23 NOTE — Progress Notes (Signed)
Subjective:    Patient ID: [name], female    DOB: 06-11-1975, 39 y.o.   MRN: [id number]  This chart was scribed for Robyn Haber, MD, by Stephania Fragmin, ED Scribe. This patient was seen in room 5 and the patient's care was started at 4:19 PM.   Chief Complaint  Patient presents with   Hypertension   Shortness of Breath   Chest Pain    pressure   Fatigue     HPI HPI Comments: Caytlyn Evers is a 39 y.o. female who presents to the [facility] complaining of hypertension. She states that about 11 days ago, she had a BP reading of 150/105 and went to the ED, where a CT scan was negative and she was discharged with a diuretic. Patient found she was allergic with the diuretic, and went to her PCP, where she had a BP reading of 133/88 and was prescribed Losartan, which she has been taking. She states that every time she takes 25 mg Losartan, she gets a heavy headache. She started taking half a pill instead, but today, she took a blood pressure reading of 148/97 after taking her half dosage. However, her blood pressure was 119/80 taken here. She also notes that anytime her blood pressure is high, she feels an uncomfortable sensation of fullness in her chest that felt like gas. This occurred during the interview, accompanied by anxiety and lightheadedness. 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 She hasn't had a surgery for it. However, patient is worried that her headache and chest fullness are related to her angioma. Her uncle passed away when he was in his early 59s because his angioma spontaneously ruptured.  Patient states she cuts her 81.25 mg thyroid medication in half.   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  Patient Active Problem List   Diagnosis Date Noted   Hot flashes 03/13/2014   Elevated BP 03/13/2014   Migraines 11/12/2013   Eczema 11/12/2013   IBS (irritable bowel  syndrome) 11/12/2013   Hypothyroidism 10/09/2011   Thyroid nodule 10/02/2011   Anxiety 10/02/2011   Past Medical History  Diagnosis Date   Migraines    GERD (gastroesophageal reflux disease)    Heart murmur     as a child   Hepatitis A    Hypothyroid    Complication of anesthesia     bp dropped with epidural   Contraception     husband vasectomy   Anxiety 2012   Panic disorder 2012   Dysmenorrhea    Abnormal Pap smear of cervix 2007    CIN 2 + HRHPV/ LEEP   Vertigo    Hemangioma, intracranial structures 2013    Left Frontal Lobe   Past Surgical History  Procedure Laterality Date   Tonsillectomy and adenoidectomy  1988   Dilation and curettage of uterus     Cervical biopsy  w/ loop electrode excision  2007    CIN 2   Colposcopy  2007    CIN 2/ HRHPV   Allergies  Allergen Reactions   Penicillins     Unknown. Other family members severely allergic. 1 family member had fatal reaction.    Sulfa Antibiotics Other (See Comments)    Hot flashes   Prior to Admission medications   Medication Sig Start Date End Date Taking? Authorizing Provider  ACETAMINOPHEN-CAFF-BUTALBITAL 50-500-40 MG CAPS 1/2 tab as needed every 8 hours as needed 11/08/13  Yes Debbrah Alar, NP  loratadine (CLARITIN) 5 MG chewable tablet Chew 5 mg by mouth as needed.    Yes Historical Provider, MD  losartan (COZAAR) 25 MG tablet Take 1 tablet (25 mg total) by mouth daily. 03/15/14  Yes Rosalita Chessman, DO  Thyroid 81.25 MG TABS Take 81.25 mg by mouth every other day.    Yes Historical Provider, MD  ALPRAZolam Duanne Moron) 0.25 MG tablet Anxiety. Take 1/2 tablet twice daily if needed for anxiety. Patient not taking: Reported on 03/13/2014 11/08/13   Debbrah Alar, NP  Cholecalciferol (VITAMIN D PO) Take 500 Units by mouth daily.    Historical Provider, MD   History   Social History   Marital Status: Married    Spouse Name: N/A   Number of Children: 3   Years of Education:  N/A   Occupational History   Not on file.   Social History Main Topics   Smoking status: Never Smoker    Smokeless tobacco: Never Used   Alcohol Use: 0.5 oz/week    1 drink(s) per week     Comment: 1-3 glasses of wine a week   Drug Use: No   Sexual Activity:    Partners: Male    Birth Control/ Protection: Other-see comments     Comment: vasectomy   Other Topics Concern   Not on file   Social History Narrative   Original from [place]  Cardiovascular:       Chest fullness  Neurological: Positive for light-headedness and headaches.       Objective:   Physical Exam  Constitutional: She is oriented to person, place, and time. She appears well-developed and well-nourished. No distress.  HENT:  Head: Normocephalic and atraumatic.  Eyes: Conjunctivae and EOM are normal.  Neck: Neck supple. No tracheal deviation present.  Cardiovascular: Normal rate.   Pulmonary/Chest: Effort normal. No respiratory distress.  Musculoskeletal: Normal range of motion.  Neurological: She is alert and oriented to person, place, and time.  Skin: Skin is warm and dry.  Psychiatric: She has a normal mood and affect. Her behavior is normal.  Nursing note and vitals reviewed.  Blood Pressure Reading Taken by Me with Manual Sphygmomanometer:  130/76.     Assessment & Plan:   This chart was scribed in my presence and reviewed by me personally.    ICD-9-CM ICD-10-CM   1. Essential hypertension 401.9 I10   2. Hemangioma 228.00 D18.00   3. Chest fullness 786.59 R07.89   4. Other vascular headache 784.0 G44.1      Signed, Robyn Haber, MD

## 2014-03-23 NOTE — Patient Instructions (Signed)
Take the losartan one half pill at a time twice a day. Continue to monitor the blood pressure once a week. Expect that the blood pressure will vary somewhat from day-to-day

## 2014-03-29 ENCOUNTER — Ambulatory Visit: Payer: 59 | Admitting: Family Medicine

## 2014-03-29 ENCOUNTER — Other Ambulatory Visit: Payer: Self-pay

## 2014-03-29 ENCOUNTER — Ambulatory Visit
Admission: RE | Admit: 2014-03-29 | Discharge: 2014-03-29 | Disposition: A | Discharge status: Home / Self Care | Payer: 59 | Source: Ambulatory Visit

## 2014-03-29 DIAGNOSIS — Z1231 Encounter for screening mammogram for malignant neoplasm of breast: Secondary | ICD-10-CM

## 2014-04-03 ENCOUNTER — Encounter: Payer: Self-pay | Admitting: Family Medicine

## 2014-04-03 ENCOUNTER — Ambulatory Visit (INDEPENDENT_AMBULATORY_CARE_PROVIDER_SITE_OTHER): Payer: 59 | Admitting: Family Medicine

## 2014-04-03 VITALS — BP 118/88 | HR 68 | Temp 98.7°F | Wt 166.4 lb

## 2014-04-03 DIAGNOSIS — I10 Essential (primary) hypertension: Secondary | ICD-10-CM

## 2014-04-03 DIAGNOSIS — M5090 Cervical disc disorder, unspecified, unspecified cervical region: Secondary | ICD-10-CM

## 2014-04-03 MED ORDER — LOSARTAN POTASSIUM 25 MG PO TABS: 25.0000 mg | ORAL_TABLET | Freq: Every day | ORAL | Status: DC

## 2014-04-03 NOTE — Progress Notes (Signed)
Subjective:    Patient ID: Emily Barker, female    DOB: [birth year], 39 y.o.   MRN: [id number]  HPI  Patient here for f/u bp  Past Medical History  Diagnosis Date  . Migraines   . GERD (gastroesophageal reflux disease)   . Heart murmur     as a child  . Hepatitis A   . Hypothyroid   . Complication of anesthesia     bp dropped with epidural  . Contraception     husband vasectomy  . Anxiety 2012  . Panic disorder 2012  . Dysmenorrhea   . Abnormal Pap smear of cervix 2007    CIN 2 + HRHPV/ LEEP  . Vertigo   . Hemangioma, intracranial structures 2013    Left Frontal Lobe    Review of Systems  Current Outpatient Prescriptions on File Prior to Visit  Medication Sig Dispense Refill  . ACETAMINOPHEN-CAFF-BUTALBITAL 50-500-40 MG CAPS 1/2 tab as needed every 8 hours as needed 15 each 0  . ALPRAZolam (XANAX) 0.25 MG tablet Anxiety. Take 1/2 tablet twice daily if needed for anxiety. 10 tablet 0  . Cholecalciferol (VITAMIN D PO) Take 500 Units by mouth daily.    Marland Kitchen loratadine (CLARITIN) 5 MG chewable tablet Chew 5 mg by mouth as needed.     . Thyroid 81.25 MG TABS Take 81.25 mg by mouth every other day.      No current facility-administered medications on file prior to visit.       Objective:    Physical Exam  Constitutional: She is oriented to person, place, and time. She appears well-developed and well-nourished. No distress.  HENT:  Right Ear: External ear normal.  Left Ear: External ear normal.  Nose: Nose normal.  Mouth/Throat: Oropharynx is clear and moist.  Eyes: EOM are normal. Pupils are equal, round, and reactive to light.  Neck: Normal range of motion. Neck supple.  Cardiovascular: Normal rate, regular rhythm and normal heart sounds.   No murmur heard. Pulmonary/Chest: Effort normal and breath sounds normal. No respiratory distress. She has no wheezes. She has no rales. She exhibits no tenderness.  Neurological: She is alert and oriented to person, place, and  time.  Psychiatric: She has a normal mood and affect. Her behavior is normal. Judgment and thought content normal.    BP 118/88 mmHg  Pulse 68  Temp(Src) 98.7 F (37.1 C) (Oral)  Wt 166 lb 6.4 oz (75.479 kg)  SpO2 98%  LMP 03/09/2014 Wt Readings from Last 3 Encounters:  04/03/14 166 lb 6.4 oz (75.479 kg)  03/23/14 164 lb (74.39 kg)  03/20/14 163 lb 6 oz (74.106 kg)     Lab Results  Component Value Date   WBC 8.9 03/12/2014   HGB 12.3 03/12/2014   HCT 38.3 03/12/2014   PLT 216 03/12/2014   GLUCOSE 112* 03/12/2014   ALT 10 03/12/2014   AST 17 03/12/2014   NA 138 03/12/2014   K 4.1 03/12/2014   CL 107 03/12/2014   CREATININE 0.69 03/12/2014   BUN 10 03/12/2014   CO2 27 03/12/2014   TSH 2.137 03/12/2014       Assessment & Plan:   Problem List Items Addressed This Visit    None    Visit Diagnoses    Cervical neck pain with evidence of disc disease    -  Primary    Relevant Orders    MR Cervical Spine Wo Contrast    Essential hypertension  Relevant Medications    losartan (COZAAR) tablet       I am having Ms. Hochstetler maintain her loratadine, Cholecalciferol (VITAMIN D PO), Thyroid, ACETAMINOPHEN-CAFF-BUTALBITAL, ALPRAZolam, and losartan.  Meds ordered this encounter  Medications  . losartan (COZAAR) 25 MG tablet    Sig: Take 1 tablet (25 mg total) by mouth daily.    Dispense:  90 tablet    Refill:  [place], DO

## 2014-04-03 NOTE — Progress Notes (Signed)
Pre visit review using our clinic review tool, if applicable. No additional management support is needed unless otherwise documented below in the visit note. 

## 2014-04-03 NOTE — Patient Instructions (Signed)

## 2014-04-03 NOTE — Progress Notes (Signed)
Pt moved appointment to [phone]

## 2014-04-23 ENCOUNTER — Ambulatory Visit: Payer: 59 | Admitting: Family Medicine

## 2014-04-28 ENCOUNTER — Other Ambulatory Visit (HOSPITAL_BASED_OUTPATIENT_CLINIC_OR_DEPARTMENT_OTHER): Payer: 59

## 2014-04-28 ENCOUNTER — Ambulatory Visit (HOSPITAL_BASED_OUTPATIENT_CLINIC_OR_DEPARTMENT_OTHER)
Admission: RE | Admit: 2014-04-28 | Discharge: 2014-04-28 | Disposition: A | Discharge status: Home / Self Care | Payer: 59 | Source: Ambulatory Visit | Attending: Family Medicine | Admitting: Family Medicine

## 2014-04-28 DIAGNOSIS — M5090 Cervical disc disorder, unspecified, unspecified cervical region: Secondary | ICD-10-CM | POA: Diagnosis present

## 2014-04-28 DIAGNOSIS — M47892 Other spondylosis, cervical region: Secondary | ICD-10-CM | POA: Diagnosis not present

## 2014-04-28 DIAGNOSIS — M5022 Other cervical disc displacement, mid-cervical region: Secondary | ICD-10-CM | POA: Insufficient documentation

## 2014-05-01 ENCOUNTER — Ambulatory Visit: Payer: 59

## 2014-05-03 ENCOUNTER — Encounter: Payer: 59 | Admitting: Family Medicine

## 2014-05-10 ENCOUNTER — Ambulatory Visit: Payer: 59 | Admitting: Family Medicine

## 2014-05-24 ENCOUNTER — Telehealth: Payer: Self-pay | Admitting: Family Medicine

## 2014-05-24 DIAGNOSIS — M5030 Other cervical disc degeneration, unspecified cervical region: Secondary | ICD-10-CM

## 2014-05-24 DIAGNOSIS — M5020 Other cervical disc displacement, unspecified cervical region: Secondary | ICD-10-CM

## 2014-05-24 NOTE — Telephone Encounter (Signed)
Ortho ref placed.       KP

## 2014-05-24 NOTE — Telephone Encounter (Signed)
Caller name: Afra Relation to pt: self Call back number: [phone] Pharmacy:  Reason for call:   Patient would like to proceed with getting a referral to an orthopaedic dr.

## 2014-07-03 ENCOUNTER — Other Ambulatory Visit: Payer: Self-pay | Admitting: Family Medicine

## 2014-07-18 ENCOUNTER — Ambulatory Visit (INDEPENDENT_AMBULATORY_CARE_PROVIDER_SITE_OTHER): Payer: 59

## 2014-07-18 ENCOUNTER — Ambulatory Visit (INDEPENDENT_AMBULATORY_CARE_PROVIDER_SITE_OTHER): Payer: 59 | Admitting: Physician Assistant

## 2014-07-18 VITALS — BP 136/84 | HR 101 | Temp 98.5°F | Resp 18 | Ht 65.0 in | Wt 162.0 lb

## 2014-07-18 DIAGNOSIS — M545 Low back pain: Secondary | ICD-10-CM | POA: Diagnosis not present

## 2014-07-18 DIAGNOSIS — M533 Sacrococcygeal disorders, not elsewhere classified: Secondary | ICD-10-CM

## 2014-07-18 MED ORDER — CYCLOBENZAPRINE HCL 5 MG PO TABS: 5.0000 mg | ORAL_TABLET | Freq: Three times a day (TID) | ORAL | Status: DC | PRN

## 2014-07-18 NOTE — Patient Instructions (Signed)

## 2014-07-18 NOTE — Progress Notes (Signed)
Subjective:    Patient ID: Emily Barker, female    DOB: [birth year], 39 y.o.   MRN: [id number]  HPI Patient presents for pain with sitting that have been present for past week. Pain is located at right lower back and sacrum and radiates down right leg and around right side. Pain is 5/10 on most days, but is worsened when gets up from sitting on toilet. 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 Also is attending graduate school and is sitting after work. Denies trauma, intense exercise, or falls. Has happened before with less intensity. Has had weaker pelvis since second child was born and notices improvement with exercise which she has not been doing lately. Denies change in bowels/urination, abdominal pain, gait change, numbness or tingling of extremities. No h/o arthritis. Laying down improves symptoms.  Med allergies: PCN and sulfa antibx    Review of Systems  Constitutional: Negative for activity change.  Gastrointestinal: Negative for abdominal pain, diarrhea and constipation.  Genitourinary: Negative for dysuria, frequency, hematuria, vaginal discharge and difficulty urinating.  Musculoskeletal: Positive for myalgias and back pain. Negative for joint swelling, arthralgias and gait problem.  Neurological: Negative for weakness and numbness.       Objective:   Physical Exam  Constitutional: She is oriented to person, place, and time. She appears well-developed and well-nourished. No distress.  Blood pressure 136/84, pulse 101, temperature 98.5 F (36.9 C), temperature source Oral, resp. rate 18, height 5\' 5"  (1.651 m), weight 162 lb (73.483 kg), last menstrual period 06/30/2014, SpO2 98 %.   HENT:  Head: Normocephalic and atraumatic.  Right Ear: External ear normal.  Left Ear: External ear normal.  Eyes: Conjunctivae are normal. Pupils are equal, round, and reactive to light. Right eye exhibits no discharge. Left  eye exhibits no discharge. No scleral icterus.  Neck: Normal range of motion. Neck supple.  Cardiovascular: Normal rate, regular rhythm and normal heart sounds.  Exam reveals no gallop and no friction rub.   No murmur heard. Pulmonary/Chest: Effort normal and breath sounds normal. No respiratory distress. She has no wheezes. She has no rales.  Abdominal: Soft. Bowel sounds are normal. She exhibits no distension. There is no tenderness. There is no guarding.  Musculoskeletal: Normal range of motion. She exhibits no edema or tenderness.       Right hip: Normal.       Left hip: Normal.       Lumbar back: She exhibits pain. She exhibits normal range of motion, no tenderness, no bony tenderness, no swelling, no edema, no deformity and no laceration.  Pain with foot inversion  Neurological: She is alert and oriented to person, place, and time. She has normal reflexes. No cranial nerve deficit. She exhibits normal muscle tone. Coordination normal.  Skin: She is not diaphoretic.  Psychiatric: She has a normal mood and affect. Her behavior is normal. Judgment and thought content normal.   [facility] reading (PRIMARY) by  Dr. Cleta Alberts. No acute abnormalities. Question of sacralization.      Assessment & Plan:  1. Right low back pain, with sciatica presence unspecified 2. Sacral pain Likely pain is musculoskeletal in origin. Can continue Tylenol. Tylenol [phone] mg tid not to exceed 3000 mg daily. Rest for 24 hours then should do back exercises that were given or can do yoga as she does when she has time to exercise.  - DG HIPS BILAT WITH PELVIS 3-4  VIEWS; Future - DG Lumbar Spine 2-3 Views; Future - cyclobenzaprine (FLEXERIL) 5 MG tablet; Take 1 tablet (5 mg total) by mouth 3 (three) times daily as needed for muscle spasms.  Dispense: 45 tablet; Refill: 0   Nelwyn Hebdon PA-C  [facility] 07/18/2014 11:05 AM

## 2014-09-24 ENCOUNTER — Ambulatory Visit: Payer: 59 | Admitting: Obstetrics and Gynecology

## 2014-09-24 ENCOUNTER — Ambulatory Visit: Payer: 59 | Admitting: Family Medicine

## 2014-09-28 ENCOUNTER — Ambulatory Visit: Payer: 59 | Admitting: Family Medicine

## 2014-10-15 ENCOUNTER — Encounter: Payer: 59 | Admitting: Physician Assistant

## 2014-11-26 ENCOUNTER — Emergency Department (HOSPITAL_BASED_OUTPATIENT_CLINIC_OR_DEPARTMENT_OTHER)
Admission: EM | Admit: 2014-11-26 | Discharge: 2014-11-26 | Disposition: A | Discharge status: Home / Self Care | Payer: 59 | Attending: Emergency Medicine | Admitting: Emergency Medicine

## 2014-11-26 ENCOUNTER — Encounter (HOSPITAL_BASED_OUTPATIENT_CLINIC_OR_DEPARTMENT_OTHER): Payer: Self-pay | Admitting: *Deleted

## 2014-11-26 ENCOUNTER — Emergency Department (HOSPITAL_BASED_OUTPATIENT_CLINIC_OR_DEPARTMENT_OTHER): Payer: 59

## 2014-11-26 DIAGNOSIS — G43909 Migraine, unspecified, not intractable, without status migrainosus: Secondary | ICD-10-CM | POA: Insufficient documentation

## 2014-11-26 DIAGNOSIS — Z79899 Other long term (current) drug therapy: Secondary | ICD-10-CM | POA: Diagnosis not present

## 2014-11-26 DIAGNOSIS — Z8719 Personal history of other diseases of the digestive system: Secondary | ICD-10-CM | POA: Diagnosis not present

## 2014-11-26 DIAGNOSIS — E039 Hypothyroidism, unspecified: Secondary | ICD-10-CM | POA: Diagnosis not present

## 2014-11-26 DIAGNOSIS — R519 Headache, unspecified: Secondary | ICD-10-CM

## 2014-11-26 DIAGNOSIS — R51 Headache: Secondary | ICD-10-CM

## 2014-11-26 DIAGNOSIS — R011 Cardiac murmur, unspecified: Secondary | ICD-10-CM | POA: Insufficient documentation

## 2014-11-26 DIAGNOSIS — F410 Panic disorder [episodic paroxysmal anxiety] without agoraphobia: Secondary | ICD-10-CM | POA: Insufficient documentation

## 2014-11-26 DIAGNOSIS — Z880 Allergy status to penicillin: Secondary | ICD-10-CM | POA: Diagnosis not present

## 2014-11-26 DIAGNOSIS — Z8619 Personal history of other infectious and parasitic diseases: Secondary | ICD-10-CM | POA: Insufficient documentation

## 2014-11-26 DIAGNOSIS — Z8742 Personal history of other diseases of the female genital tract: Secondary | ICD-10-CM | POA: Insufficient documentation

## 2014-11-26 DIAGNOSIS — D1802 Hemangioma of intracranial structures: Secondary | ICD-10-CM | POA: Diagnosis not present

## 2014-11-26 NOTE — Discharge Instructions (Signed)

## 2014-11-26 NOTE — ED Notes (Addendum)
H/o cavernous hemangioma L frontal lobe. Sharp, stabbing L frontal head pain woke her up, concerned for bleeding, wants a CT, pain resolved, denies pain or other sx at this time. Reports a lingering sensation like a bruise, but no pain or other sx. No meds PTA.

## 2014-11-26 NOTE — ED Notes (Signed)
Alert, NAD, calm, interactive, denies needs, pending CT results, husband at [facility].

## 2014-11-26 NOTE — ED Provider Notes (Signed)
CSN: [id number]     Arrival date & time 11/26/14  0444 History   First MD Initiated Contact with Patient 11/26/14 0500     Chief Complaint  Patient presents with  . Headache     (Consider location/radiation/quality/duration/timing/severity/associated sxs/prior Treatment) HPI Comments: Patient is a 39 year old female with history of the cavernous hemangioma and migraine headaches. She presents with complaints of severe pain in the left side of her head that woke her from sleep. It lasted for approximately one hour and has since resolved. She is concerned that her hemangioma may be bleeding. Visual disturbances. She denies any nausea or vomiting.  Patient is a 39 y.o. female presenting with headaches. The history is provided by the patient.  Headache Pain location:  L parietal Quality:  Sharp Onset quality:  Sudden Duration:  1 hour Timing:  Constant Progression:  Resolved Chronicity:  New Similar to prior headaches: no   Relieved by:  Nothing Worsened by:  Nothing Ineffective treatments:  None tried   Past Medical History  Diagnosis Date  . Migraines   . GERD (gastroesophageal reflux disease)   . Heart murmur     as a child  . Hepatitis A   . Hypothyroid   . Complication of anesthesia     bp dropped with epidural  . Contraception     husband vasectomy  . Anxiety 2012  . Panic disorder 2012  . Dysmenorrhea   . Abnormal Pap smear of cervix 2007    CIN 2 + HRHPV/ LEEP  . Vertigo   . Hemangioma, intracranial structures ([place]) 2013    Left Frontal Lobe   Past Surgical History  Procedure Laterality Date  . Tonsillectomy and adenoidectomy  1988  . Dilation and curettage of uterus    . Cervical biopsy  w/ loop electrode excision  2007    CIN 2  . Colposcopy  2007    CIN 2/ HRHPV   Family History  Problem Relation Age of Onset  . Prostate cancer Father   . Hypertension Father   . Alcohol abuse Father   . Heart disease Father   . Hyperlipidemia Mother   .  Hypertension Brother   . Alcohol abuse Paternal Grandfather   . Heart disease Paternal Grandfather   . Breast cancer Maternal Aunt   . Depression Maternal Aunt   . Depression Maternal Uncle   . Heart disease Paternal Aunt   . Dementia Maternal Grandmother   . Heart disease Maternal Grandfather   . Heart disease Paternal Grandmother   . Depression Maternal Aunt    Social History  Substance Use Topics  . Smoking status: Never Smoker   . Smokeless tobacco: Never Used  . Alcohol Use: 0.6 oz/week    1 Standard drinks or equivalent per week     Comment: 1-3 glasses of wine a week   OB History    Gravida Para Term Preterm AB TAB SAB Ectopic Multiple Living   5 3   2 1 1   3      Review of Systems  Neurological: Positive for headaches.  All other systems reviewed and are negative.     Allergies  Penicillins and Sulfa antibiotics  Home Medications   Prior to Admission medications   Medication Sig Start Date End Date Taking? Authorizing Provider  ACETAMINOPHEN-CAFF-BUTALBITAL 50-500-40 MG CAPS 1/2 tab as needed every 8 hours as needed Patient not taking: Reported on 07/18/2014 11/08/13   Debbrah Alar, NP  ALPRAZolam Duanne Moron) 0.25 MG tablet Anxiety.  Take 1/2 tablet twice daily if needed for anxiety. Patient not taking: Reported on 07/18/2014 11/08/13   Debbrah Alar, NP  Cholecalciferol (VITAMIN D PO) Take 500 Units by mouth daily.    Historical Provider, MD  cyclobenzaprine (FLEXERIL) 5 MG tablet Take 1 tablet (5 mg total) by mouth 3 (three) times daily as needed for muscle spasms. 07/18/14   Tishira R Brewington, PA-C  loratadine (CLARITIN) 5 MG chewable tablet Chew 5 mg by mouth as needed.     Historical Provider, MD  losartan (COZAAR) 25 MG tablet TAKE 1 TABLET (25 MG TOTAL) BY MOUTH DAILY. 07/03/14   Rosalita Chessman, DO  Thyroid 81.25 MG TABS Take 81.25 mg by mouth every other day.     Historical Provider, MD   BP 134/105 mmHg  Pulse 94  Temp(Src) 98.2 F (36.8 C)  (Oral)  Resp 16  Ht 5\' 4"  (1.626 m)  Wt 165 lb (74.844 kg)  BMI 28.31 kg/m2  SpO2 98% Physical Exam  Constitutional: She is oriented to person, place, and time. She appears well-developed and well-nourished. No distress.  HENT:  Head: Normocephalic and atraumatic.  Eyes: EOM are normal. Pupils are equal, round, and reactive to light.  Neck: Normal range of motion. Neck supple.  Cardiovascular: Normal rate and regular rhythm.  Exam reveals no gallop and no friction rub.   No murmur heard. Pulmonary/Chest: Effort normal and breath sounds normal. No respiratory distress. She has no wheezes.  Abdominal: Soft. Bowel sounds are normal. She exhibits no distension. There is no tenderness.  Musculoskeletal: Normal range of motion.  Neurological: She is alert and oriented to person, place, and time. No cranial nerve deficit. She exhibits normal muscle tone. Coordination normal.  Skin: Skin is warm and dry. She is not diaphoretic.  Nursing note and vitals reviewed.   ED Course  Procedures (including critical care time) Labs Review Labs Reviewed - No data to display  Imaging Review No results found. I have personally reviewed and evaluated these images and lab results as part of my medical decision-making.   EKG Interpretation None      MDM   Final diagnoses:  None    Patient presents after the acute onset of headache which woke her from sleep. She has a history of a cavernous hemangioma is concerned about bleeding. Her CT scan does not show any change from prior studies and no evidence for hemorrhage. Her headache is now gone and she feels better. She is neurologically intact and I feel is appropriate to be discharged.    Veryl Speak, MD 11/26/14 [phone]

## 2014-11-26 NOTE — ED Notes (Addendum)
Dr. Stark Jock into see pt, pt alert, NAD, calm, interactive, resps e/u, speaking in clear complete sentences. Family at [facility].

## 2014-12-13 ENCOUNTER — Ambulatory Visit: Payer: 59 | Admitting: Obstetrics and Gynecology

## 2014-12-14 ENCOUNTER — Ambulatory Visit (INDEPENDENT_AMBULATORY_CARE_PROVIDER_SITE_OTHER): Payer: 59 | Admitting: Family Medicine

## 2014-12-14 ENCOUNTER — Encounter: Payer: Self-pay | Admitting: Family Medicine

## 2014-12-14 VITALS — BP 108/80 | HR 122 | Temp 98.8°F | Resp 16 | Ht 64.0 in | Wt 163.2 lb

## 2014-12-14 DIAGNOSIS — J069 Acute upper respiratory infection, unspecified: Secondary | ICD-10-CM | POA: Diagnosis not present

## 2014-12-14 DIAGNOSIS — B9789 Other viral agents as the cause of diseases classified elsewhere: Principal | ICD-10-CM

## 2014-12-14 NOTE — Progress Notes (Signed)
Subjective:    Patient ID: Emily Barker, female    DOB: 1975-05-13, 39 y.o.   MRN: [id number]  HPI This is a pleasant female who presents today with 5 days of fever, chills, nonproductive coug. No nasal drainage. Sore throat for 4 days and loss of voice. Fever two days ago to 102. No fever yesterday or today. Felt achy until last night. Cough keeping her up. Has Delsym at home, but hasn't taken because she is concerned it will raise her blood pressure. Has taken acetaminophen 500 mg for fever with good relief. Has been hydrating aggressively, urine light yellow. Right temporal headache with cough. She has some flexeril at home, but hasn't taken. She prefers not to take medication if possible. Can not take NSAIDs due to intracranial hemangioma. No history of asthma.   Past Medical History  Diagnosis Date  . Migraines   . GERD (gastroesophageal reflux disease)   . Heart murmur     as a child  . Hepatitis A   . Hypothyroid   . Complication of anesthesia     bp dropped with epidural  . Contraception     husband vasectomy  . Anxiety 2012  . Panic disorder 2012  . Dysmenorrhea   . Abnormal Pap smear of cervix 2007    CIN 2 + HRHPV/ LEEP  . Vertigo   . Hemangioma, intracranial structures ([place]) 2013    Left Frontal Lobe   Past Surgical History  Procedure Laterality Date  . Tonsillectomy and adenoidectomy  1988  . Dilation and curettage of uterus    . Cervical biopsy  w/ loop electrode excision  2007    CIN 2  . Colposcopy  2007    CIN 2/ HRHPV   Family History  Problem Relation Age of Onset  . Prostate cancer Father   . Hypertension Father   . Alcohol abuse Father   . Heart disease Father   . Hyperlipidemia Mother   . Hypertension Brother   . Alcohol abuse Paternal Grandfather   . Heart disease Paternal Grandfather   . Breast cancer Maternal Aunt   . Depression Maternal Aunt   . Depression Maternal Uncle   . Heart disease Paternal Aunt   . Dementia Maternal Grandmother    . Heart disease Maternal Grandfather   . Heart disease Paternal Grandmother   . Depression Maternal Aunt    Social History  Substance Use Topics  . Smoking status: Never Smoker   . Smokeless tobacco: Never Used  . Alcohol Use: 0.6 oz/week    1 Standard drinks or equivalent per week     Comment: 1-3 glasses of wine a week     Review of Systems Per HPI    Objective:   Physical Exam  Constitutional: She is oriented to person, place, and time. She appears well-developed and well-nourished. No distress.  HENT:  Head: Normocephalic and atraumatic.  Right Ear: Tympanic membrane, external ear and ear canal normal.  Left Ear: Tympanic membrane, external ear and ear canal normal.  Nose: Rhinorrhea present.  Mouth/Throat: Posterior oropharyngeal erythema present.  Cardiovascular: Regular rhythm and normal heart sounds.  Tachycardia present.   Recheck HR 100.   Pulmonary/Chest: Effort normal and breath sounds normal.  Musculoskeletal: Normal range of motion.  Neurological: She is alert and oriented to person, place, and time.  Skin: Skin is warm and dry. She is not diaphoretic.  Psychiatric: She has a normal mood and affect. Her behavior is normal. Judgment and thought content  normal.  Vitals reviewed.  BP 108/80 mmHg  Pulse 122  Temp(Src) 98.8 F (37.1 C) (Oral)  Resp 16  Ht 5\' 4"  (1.626 m)  Wt 163 lb 3.2 oz (74.027 kg)  BMI 28.00 kg/m2  SpO2 97%  LMP 12/08/2014     Assessment & Plan:  1. Viral URI with cough - patient with no fever or myalgias for 2 days, discussed testing for influenza A/B, patient declined - offered prescription cough suppressant, patient declined. She will take Delsym - acetaminophen for muscle aches, fever - continue good hydration - RTC if she develops purulent sputum, SOB/wheeze, return of fever or if not improved in 3 days - she can take the cyclobenzaprine she has at home for headache  Clarene Reamer, FNP-BC  [facility],  [facility]  12/14/2014 9:27 AM

## 2014-12-14 NOTE — Patient Instructions (Signed)

## 2014-12-26 ENCOUNTER — Other Ambulatory Visit: Payer: Self-pay | Admitting: Family Medicine

## 2014-12-26 NOTE — Telephone Encounter (Signed)
Med filled #30 with 0, pt is overdue for BP follow up. No further refills without pt being seen.

## 2015-01-14 ENCOUNTER — Encounter: Payer: Self-pay | Admitting: Obstetrics and Gynecology

## 2015-01-14 ENCOUNTER — Ambulatory Visit: Payer: 59 | Admitting: Obstetrics and Gynecology

## 2015-01-30 ENCOUNTER — Ambulatory Visit (INDEPENDENT_AMBULATORY_CARE_PROVIDER_SITE_OTHER): Payer: 59 | Admitting: Family Medicine

## 2015-01-30 ENCOUNTER — Encounter: Payer: Self-pay | Admitting: Family Medicine

## 2015-01-30 VITALS — BP 133/85 | HR 100 | Temp 98.9°F | Resp 16 | Ht 64.0 in | Wt 167.2 lb

## 2015-01-30 DIAGNOSIS — K649 Unspecified hemorrhoids: Secondary | ICD-10-CM | POA: Diagnosis not present

## 2015-01-30 DIAGNOSIS — D1802 Hemangioma of intracranial structures: Secondary | ICD-10-CM | POA: Diagnosis not present

## 2015-01-30 DIAGNOSIS — I10 Essential (primary) hypertension: Secondary | ICD-10-CM

## 2015-01-30 DIAGNOSIS — G43909 Migraine, unspecified, not intractable, without status migrainosus: Secondary | ICD-10-CM | POA: Diagnosis not present

## 2015-01-30 DIAGNOSIS — F419 Anxiety disorder, unspecified: Secondary | ICD-10-CM

## 2015-01-30 DIAGNOSIS — E039 Hypothyroidism, unspecified: Secondary | ICD-10-CM | POA: Diagnosis not present

## 2015-01-30 LAB — COMPLETE METABOLIC PANEL WITH GFR
ALT: 11 U/L (ref 6–29)
AST: 14 U/L (ref 10–30)
Albumin: 4.4 g/dL (ref 3.6–5.1)
Alkaline Phosphatase: 66 U/L (ref 33–115)
BUN: 13 mg/dL (ref 7–25)
CO2: 26 mmol/L (ref 20–31)
Calcium: 9.6 mg/dL (ref 8.6–10.2)
Chloride: 102 mmol/L (ref 98–110)
Creat: 0.71 mg/dL (ref 0.50–1.10)
GFR, Est African American: >89 mL/min (ref >=60)
GFR, Est Non African American: >89 mL/min (ref >=60)
Glucose, Bld: 81 mg/dL (ref 65–99)
Potassium: 3.9 mmol/L (ref 3.5–5.3)
Sodium: 140 mmol/L (ref 135–146)
Total Bilirubin: 0.3 mg/dL (ref 0.2–1.2)
Total Protein: 7.3 g/dL (ref 6.1–8.1)

## 2015-01-30 LAB — LIPID PANEL
Cholesterol: 197 mg/dL (ref 125–200)
HDL: 59 mg/dL (ref >=46)
LDL Cholesterol: 114 mg/dL (ref <130)
Total CHOL/HDL Ratio: 3.3 Ratio (ref <=5.0)
Triglycerides: 120 mg/dL (ref <150)
VLDL: 24 mg/dL (ref <30)

## 2015-01-30 LAB — TSH: TSH: 3.38 u[IU]/mL (ref 0.350–4.500)

## 2015-01-30 LAB — CBC
HCT: 39.2 % (ref 36.0–46.0)
Hemoglobin: 12.6 g/dL (ref 12.0–15.0)
MCH: 25.1 pg — ABNORMAL LOW (ref 26.0–34.0)
MCHC: 32.1 g/dL (ref 30.0–36.0)
MCV: 78.2 fL (ref 78.0–100.0)
MPV: 9.9 fL (ref 8.6–12.4)
Platelets: 244 10*3/uL (ref 150–400)
RBC: 5.01 MIL/uL (ref 3.87–5.11)
RDW: 14.8 % (ref 11.5–15.5)
WBC: 7.9 10*3/uL (ref 4.0–10.5)

## 2015-01-30 MED ORDER — HYDROCORTISONE ACETATE 25 MG RE SUPP
25.0000 mg | Freq: Two times a day (BID) | RECTAL | Status: DC
Start: 2015-01-30 — End: 2015-03-13

## 2015-01-30 MED ORDER — LOSARTAN POTASSIUM 25 MG PO TABS: 25.0000 mg | ORAL_TABLET | Freq: Every day | ORAL | Status: DC

## 2015-01-30 MED ORDER — BUTALBITAL-APAP-CAFFEINE 50-500-40 MG PO CAPS: ORAL_CAPSULE | ORAL | Status: DC

## 2015-01-30 MED ORDER — ALPRAZOLAM 0.25 MG PO TABS: ORAL_TABLET | ORAL | Status: DC

## 2015-01-30 MED FILL — LOSARTAN POTASSIUM 25 MG TA: 25 | 30 days supply | Qty: 30 | Fill #0

## 2015-01-30 MED FILL — ANUCORT-HC 25 MG SUPP: 25 | 6 days supply | Qty: 12 | Fill #0

## 2015-01-30 NOTE — Patient Instructions (Addendum)
Can try Afrin nasal spray 1-2 sprays in each nostril 1-2 times a day for up to 3 days. Look for gel saline nasal spray for your dry nostrils.

## 2015-01-30 NOTE — Progress Notes (Signed)
Subjective:    Patient ID: Emily Barker, female    DOB: 05-09-1975, 40 y.o.   MRN: [id number]  HPI This is a pleasant 40 yo female who would like to establish care at [facility] and would like medication refill, labs and evaluation of acute illness. She sees gyn annually and has appointment next month. She will be training for a 5K and 8K runs with her son soon. 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 She is also in school part time.   She presents today with 4 days of sore throat, yellow nasal drainage, non productive cough. Has not been sleeping well, not sure if related to anxiety about going back to work or nasal congestion. Does not like to take medication and has not taken anything for her symptoms. She has not had fever or chills. No SOB/wheeze/chest tightness. No ear pain.   Would like a refill of Xanax, uses less than 10 pills a year. She presents two bottles from last two years of old prescription and requests they be discarded. Each bottle has several pills. Her brother is a Software engineer and she reports that she is "funny," about taking anything past it's expiration date. She did not try a xanax last night when she couldn't sleep.   Is on losartin and denies side effects.   Has 2-3 bowel movements daily. Has had problems with hemorroids for several months. Has chronic problems since 2013. Has occasional bleeding and pain (about once a week). She is currently menstruating and declines exam.   Has left sided calf pain and pain behind left knee. Occurs occasionally and has for possibly years, resolves spontaneously within minutes to hours. May occur a couple of times per month. Not currently symptomatic. Never has swelling, redness or palpable mass.    She has a hemangioma and requests referral to Neurology. Last CT 10/16. She has occasional headache and worries about hemangioma and hemorrage.   Past Medical  History  Diagnosis Date  . Migraines   . GERD (gastroesophageal reflux disease)   . Heart murmur     as a child  . Hepatitis A   . Hypothyroid   . Complication of anesthesia     bp dropped with epidural  . Contraception     husband vasectomy  . Anxiety 2012  . Panic disorder 2012  . Dysmenorrhea   . Abnormal Pap smear of cervix 2007    CIN 2 + HRHPV/ LEEP  . Vertigo   . Hemangioma, intracranial structures ([place]) 2013    Left Frontal Lobe   Past Surgical History  Procedure Laterality Date  . Tonsillectomy and adenoidectomy  1988  . Dilation and curettage of uterus    . Cervical biopsy  w/ loop electrode excision  2007    CIN 2  . Colposcopy  2007    CIN 2/ HRHPV   Family History  Problem Relation Age of Onset  . Prostate cancer Father   . Hypertension Father   . Alcohol abuse Father   . Heart disease Father   . Hyperlipidemia Mother   . Hypertension Brother   . Alcohol abuse Paternal Grandfather   . Heart disease Paternal Grandfather   . Breast cancer Maternal Aunt   . Depression Maternal Aunt   . Depression Maternal Uncle   . Heart disease Paternal Aunt   . Dementia Maternal Grandmother   . Heart disease Maternal Grandfather   .  Heart disease Paternal Grandmother   . Depression Maternal Aunt    Social History  Substance Use Topics  . Smoking status: Never Smoker   . Smokeless tobacco: Never Used  . Alcohol Use: 0.6 oz/week    1 Standard drinks or equivalent per week     Comment: 1-3 glasses of wine a week     Review of Systems Per HPI    Objective:   Physical Exam  Constitutional: She is oriented to person, place, and time. She appears well-developed and well-nourished.  HENT:  Head: Normocephalic.  Right Ear: Tympanic membrane, external ear and ear canal normal.  Left Ear: Tympanic membrane, external ear and ear canal normal.  Nose: Mucosal edema and rhinorrhea present. Right sinus exhibits no maxillary sinus tenderness and no frontal sinus  tenderness. Left sinus exhibits no maxillary sinus tenderness and no frontal sinus tenderness.  Mouth/Throat: Mucous membranes are normal. Posterior oropharyngeal erythema present. No oropharyngeal exudate or posterior oropharyngeal edema.  Eyes: Conjunctivae are normal.  Neck: Normal range of motion. Neck supple.  Cardiovascular: Normal rate, regular rhythm and normal heart sounds.   Pulmonary/Chest: Effort normal and breath sounds normal.  Musculoskeletal: Normal range of motion.  Neurological: She is alert and oriented to person, place, and time.  Skin: Skin is warm and dry.  Psychiatric: She has a normal mood and affect. Her behavior is normal. Judgment and thought content normal.  Vitals reviewed.  BP 133/85 mmHg  Pulse 100  Temp(Src) 98.9 F (37.2 C) (Oral)  Resp 16  Ht 5\' 4"  (1.626 m)  Wt 167 lb 3.2 oz (75.841 kg)  BMI 28.69 kg/m2  SpO2 100%  LMP 01/07/2015     Assessment & Plan:  1. Essential hypertension - losartan (COZAAR) 25 MG tablet; Take 1 tablet (25 mg total) by mouth daily.  Dispense: 30 tablet; Refill: 0 - CBC - Lipid panel - COMPLETE METABOLIC PANEL WITH GFR  2. Hypothyroidism, unspecified hypothyroidism type - TSH  3. Anxiety - ALPRAZolam (XANAX) 0.25 MG tablet; Anxiety. Take 1/2 tablet twice daily if needed for anxiety.  Dispense: 10 tablet; Refill: 0 - encouraged yoga/meditation/mindfullness, regular exercise, good sleep hygiene  4. Hemorrhoids, unspecified hemorrhoid type - discussed treatment options and offered referral to surgery or GI for banding. Patient would like to try medical treatment and will let me know if she desires referral.  - hydrocortisone (ANUSOL-HC) 25 MG suppository; Place 1 suppository (25 mg total) rectally 2 (two) times daily.  Dispense: 12 suppository; Refill: 1  5. Migraine without status migrainosus, not intractable, unspecified migraine type - requests refill of meds- has a couple of headaches a year -  ACETAMINOPHEN-CAFF-BUTALBITAL 50-500-40 MG CAPS; 1/2 tab as needed every 8 hours as needed  Dispense: 10 each; Refill: 0  6. Brain hemangioma ([place]) - Ambulatory referral to Neurology  7. Viral URI - encouraged rest, fluids, discussed symptomatic relief and RTC precautions.  - follow up HTN q6 months  Clarene Reamer, FNP-BC  [facility], [facility]  02/02/2015 1:20 PM

## 2015-02-03 ENCOUNTER — Telehealth: Payer: Self-pay | Admitting: *Deleted

## 2015-02-03 NOTE — Telephone Encounter (Signed)
LVM for pt. Advised office will be closed tomorrow due to inclement weather. Advised to call back Tuesday to r/s appt.

## 2015-02-04 ENCOUNTER — Ambulatory Visit: Payer: 59 | Admitting: Neurology

## 2015-02-21 ENCOUNTER — Other Ambulatory Visit: Payer: Self-pay | Admitting: Family Medicine

## 2015-02-22 ENCOUNTER — Telehealth: Payer: Self-pay

## 2015-02-22 DIAGNOSIS — I10 Essential (primary) hypertension: Secondary | ICD-10-CM

## 2015-02-22 MED ORDER — LOSARTAN POTASSIUM 25 MG PO TABS: 25.0000 mg | ORAL_TABLET | Freq: Every day | ORAL | Status: DC

## 2015-02-22 MED FILL — LOSARTAN POTASSIUM 25 MG TA: 25 | 90 days supply | Qty: 90 | Fill #0

## 2015-02-22 NOTE — Telephone Encounter (Signed)
Cozaar sent 

## 2015-02-22 NOTE — Telephone Encounter (Signed)
Pt is needing a refill on blood pressure medications she has two pills left and will be out by [employer]

## 2015-02-24 DIAGNOSIS — Z0100 Encounter for examination of eyes and vision without abnormal findings: Secondary | ICD-10-CM | POA: Diagnosis not present

## 2015-02-27 ENCOUNTER — Ambulatory Visit: Payer: 59 | Admitting: Obstetrics and Gynecology

## 2015-03-07 ENCOUNTER — Ambulatory Visit (INDEPENDENT_AMBULATORY_CARE_PROVIDER_SITE_OTHER): Payer: 59 | Admitting: Obstetrics and Gynecology

## 2015-03-13 ENCOUNTER — Ambulatory Visit (INDEPENDENT_AMBULATORY_CARE_PROVIDER_SITE_OTHER): Payer: 59 | Admitting: Obstetrics and Gynecology

## 2015-03-13 ENCOUNTER — Encounter: Payer: Self-pay | Admitting: Obstetrics and Gynecology

## 2015-03-13 VITALS — BP 122/80 | HR 76 | Resp 16 | Ht 64.25 in | Wt 170.8 lb

## 2015-03-13 DIAGNOSIS — N941 Unspecified dyspareunia: Secondary | ICD-10-CM | POA: Diagnosis not present

## 2015-03-13 DIAGNOSIS — N8110 Cystocele, unspecified: Secondary | ICD-10-CM

## 2015-03-13 DIAGNOSIS — Z1151 Encounter for screening for human papillomavirus (HPV): Secondary | ICD-10-CM | POA: Diagnosis not present

## 2015-03-13 DIAGNOSIS — Z01419 Encounter for gynecological examination (general) (routine) without abnormal findings: Secondary | ICD-10-CM | POA: Diagnosis not present

## 2015-03-13 DIAGNOSIS — IMO0002 Reserved for concepts with insufficient information to code with codable children: Secondary | ICD-10-CM

## 2015-03-13 NOTE — Progress Notes (Signed)
Patient ID: Emily Barker, female   DOB: April 17, 1975, 40 y.o.   MRN: [id number] 39 y.o. [id number] Married Hispanic female here for annual exam.    Intercourse is painful sometimes.  Pelvic ultrasound normal in March 2015.   Menses with cramping and pain all the way up to her chest.  Pain does not prevent her from doing normal activities.  Menses are regular.  Last 5 days.  Can spot for 3 - 4 days and then start her menses a week later.   Birth control does not agree with her.  She has emotional changes.   Thyroid function is normal.   Feels like her bladder is dropping.  Voiding a lot.  Leaks with cough only.   PCP: [facility]     Patient's last menstrual period was 03/01/2015 (exact date).          Sexually active: Yes.  female  The current method of family planning is vasectomy.    Exercising: Yes.    walking.  Cannot do vigorous exercise due to cavernous hemangioma. Smoker:  no  Health Maintenance: Pap:  02-27-13 Neg:Neg HR HPV History of abnormal Pap:  Yes, 2007 Hx colposcopy and LEEP procedure--CIN 2 with Pos. HR HPV MMG:  03-29-14 Density Cat.C/Neg/BiRads1:[facility] Colonoscopy:  n/a BMD:   n/a  Result  n/a TDaP:  2011 Screening Labs:  Hb today: PCP, Urine today:  neg   reports that she has never smoked. She has never used smokeless tobacco. She reports that she drinks about 4.2 oz of alcohol per week. She reports that she does not use illicit drugs.  Past Medical History  Diagnosis Date  . Migraines   . GERD (gastroesophageal reflux disease)   . Heart murmur     as a child  . Hepatitis A   . Complication of anesthesia     bp dropped with epidural  . Contraception     husband vasectomy  . Anxiety 2012  . Panic disorder 2012  . Dysmenorrhea   . Abnormal Pap smear of cervix 2007    CIN 2 + HRHPV/ LEEP  . Vertigo   . Hemangioma, intracranial structures ([place]) 2013    Left Frontal Lobe  . Hypothyroid     right thyroid nodule    Past Surgical History   Procedure Laterality Date  . Tonsillectomy and adenoidectomy  1988  . Dilation and curettage of uterus    . Cervical biopsy  w/ loop electrode excision  2007    CIN 2  . Colposcopy  2007    CIN 2/ HRHPV    Current Outpatient Prescriptions  Medication Sig Dispense Refill  . ACETAMINOPHEN-CAFF-BUTALBITAL 50-500-40 MG CAPS 1/2 tab as needed every 8 hours as needed 10 each 0  . ALPRAZolam (XANAX) 0.25 MG tablet Anxiety. Take 1/2 tablet twice daily if needed for anxiety. 10 tablet 0  . Cholecalciferol (VITAMIN D PO) Take 500 Units by mouth daily.    . fluticasone (FLONASE) 50 MCG/ACT nasal spray Place into both nostrils daily.    Marland Kitchen loratadine (CLARITIN) 5 MG chewable tablet Chew 5 mg by mouth as needed.     Marland Kitchen losartan (COZAAR) 25 MG tablet Take 1 tablet (25 mg total) by mouth daily. 30 tablet 5  . Multiple Vitamin (MULTIVITAMIN) tablet Take 1 tablet by mouth daily.    . Thyroid 81.25 MG TABS Take 81.25 mg by mouth every other day.     . vitamin C (ASCORBIC ACID) 500 MG tablet Take  500 mg by mouth daily.     No current facility-administered medications for this visit.    Family History  Problem Relation Age of Onset  . Prostate cancer Father 77    Metastatic CA to bones  . Hypertension Father   . Alcohol abuse Father   . Heart disease Father   . Cancer Father     Prostate  . Hyperlipidemia Mother   . Diabetes Mother   . Hypertension Brother   . Alcohol abuse Paternal Grandfather   . Heart disease Paternal Grandfather   . Breast cancer Maternal Aunt   . Depression Maternal Aunt   . Depression Maternal Uncle   . Heart disease Paternal Aunt   . Dementia Maternal Grandmother   . Heart disease Maternal Grandfather   . Heart disease Paternal Grandmother   . Depression Maternal Aunt     ROS:  Pertinent items are noted in HPI.  Otherwise, a comprehensive ROS was negative.  Exam:   BP 122/80 mmHg  Pulse 76  Resp 16  Ht 5' 4.25" (1.632 m)  Wt 170 lb 12.8 oz (77.474 kg)  BMI  29.09 kg/m2  LMP 03/01/2015 (Exact Date)    General appearance: alert, cooperative and appears stated age Head: Normocephalic, without obvious abnormality, atraumatic Neck: no adenopathy, supple, symmetrical, trachea midline and thyroid with left 1 cm nodule.   Lungs: clear to auscultation bilaterally Breasts: normal appearance, no masses or tenderness, Inspection negative, No nipple retraction or dimpling, No nipple discharge or bleeding, No axillary or supraclavicular adenopathy Heart: regular rate and rhythm Abdomen: soft, non-tender; bowel sounds normal; no masses,  no organomegaly Extremities: extremities normal, atraumatic, no cyanosis or edema Skin: Skin color, texture, turgor normal. No rashes or lesions Lymph nodes: Cervical, supraclavicular, and axillary nodes normal. No abnormal inguinal nodes palpated Neurologic: Grossly normal  Pelvic: External genitalia:  no lesions              Urethra:  normal appearing urethra with no masses, tenderness or lesions              Bartholins and Skenes: normal                 Vagina: normal appearing vagina with normal color and discharge, no lesions.  First degree urethrocele/cystocele.  First degree rectocele.  Less than first degree uterine prolapse.              Cervix: no lesions              Pap taken: Yes.   Bimanual Exam:  Uterus:  normal size, contour, position, consistency, mobility, non-tender              Adnexa: normal adnexa and no mass, fullness, tenderness              Rectovaginal: Yes.  .  Confirms.              Anus:  normal sphincter tone, no lesions  Chaperone was present for exam.  Assessment:   Well woman visit with normal exam. Incomplete uterovaginal prolapse. Mild GSI. Hx CIN II.  Hx LEEP.  Thyroid nodule.  Hypothyroid.  Sees PCP.   Plan: Yearly mammogram recommended after age 1.  Recommended self breast exam.  Pap and HR HPV as above. Discussed Calcium, Vitamin D, regular exercise program including  cardiovascular and weight bearing exercise. Labs performed.  No..   See orders. Refills given on medications.  No..  See orders. Do Kegels.  Discussed avoidance of straining.  Offered PT, pessary, and surgery.  Patient will observe. Follow up annually and prn.      After visit summary provided.

## 2015-03-13 NOTE — Patient Instructions (Signed)

## 2015-03-15 LAB — IPS PAP TEST WITH HPV

## 2015-04-11 DIAGNOSIS — Z0120 Encounter for dental examination and cleaning without abnormal findings: Secondary | ICD-10-CM | POA: Diagnosis not present

## 2015-05-01 ENCOUNTER — Encounter: Payer: Self-pay | Admitting: Neurology

## 2015-05-01 ENCOUNTER — Ambulatory Visit (INDEPENDENT_AMBULATORY_CARE_PROVIDER_SITE_OTHER): Payer: 59 | Admitting: Neurology

## 2015-05-01 VITALS — BP 143/89 | HR 83 | Ht 64.25 in | Wt 171.8 lb

## 2015-05-01 DIAGNOSIS — G43009 Migraine without aura, not intractable, without status migrainosus: Secondary | ICD-10-CM

## 2015-05-01 DIAGNOSIS — D1802 Hemangioma of intracranial structures: Secondary | ICD-10-CM | POA: Diagnosis not present

## 2015-05-01 NOTE — Patient Instructions (Addendum)
Remember to drink plenty of fluid, eat healthy meals and do not skip any meals. Try to eat protein with a every meal and eat a healthy snack such as fruit or nuts in between meals. Try to keep a regular sleep-wake schedule and try to exercise daily, particularly in the form of walking, 20-30 minutes a day, if you can.   As far as your medications are concerned, I would like to suggest: continue current medications  As far as diagnostic testing: none at this time  Our phone number is [phone]. We also have an after hours call service for urgent matters and there is a physician on-call for urgent questions. For any emergencies you know to call 911 or go to the nearest emergency room

## 2015-05-01 NOTE — Progress Notes (Signed)
[facility]    Provider:  Dr Jaynee Eagles Referring Provider: Carollee Herter, Alferd Apa, * Primary Care Physician:  Elby Beck, FNP  CC:  hemangioma  HPI:  Emily [facility] is a 40 y.o. female here as a referral from Dr. Carollee Herter for hereditary cavernous malformation on her father's side, multiple family members on that side have vascular lesions. This was discovered many years ago and has remained stable and asymptomatic. She has past medical history of migraines, vertigo and panic attacks,hypothyroidism graves disease. She gets the migraines on the right side. She has a daily pain in the left frontal area of the head. Very localized pain in the area that improves to hair pulling and pressure. She has spoke to neurosurgeons about removal of her hemangioma and does not want to pursue that at this time. She is here to establish care with a neurologist. She has aching muscles. She snores but she sleeps on her side and not excessively tired during the day, no apneic events. She has a history of CTS but those symptoms have improved. She has monthly migraines that she feels are well controlled. No new symptoms, no other focal neurologic deficits, no vision changes, no dysarthria or aphasia, no weakness, no sensory changes of her limbs.  Reviewed notes, labs and imaging from outside physicians, which showed:  CT of the head 11/15/2014: Personally reviewed images and agree with the following The posterior left frontal cavernoma is again evident and is unchanged. There is no intracranial hemorrhage. There is no extra-axial fluid collection. There is no evidence of acute infarction. Gray matter and white matter are otherwise unremarkable in appearance. Brain volume is normal for age. No bony abnormality is evident.  IMPRESSION: Unchanged posterior left frontal cavernoma. No acute intracranial findings. Otherwise normal brain.   MRI of the brain in November 2013: Personally reviewed  and agree with the following. Also reviewed images with the patient. Comparison: CT head without contrast 12/21/2011.  Findings: A 9 x 10 x 9 mm T2 hyperintense lesion is evident within the left frontal lobe. A well-defined T2 hypointense rim surrounds the lesion with susceptibility. Slight enhancement of the lesion is consistent with vascularity. The lesion is isointense to white matter on T1-weighted images. No additional lesions are present. Significant susceptibility artifact is associated on the diffusion sequence.  No acute infarct is present. No other hemorrhage or mass is evident. No significant white matter disease is present.  Flow is present in the major intracranial arteries. The globes and orbits are intact. Mild mucosal thickening is present maxillary sinuses and ethmoid air cells bilaterally. The mastoid air cells are clear.  IMPRESSION:  1. 10 mm lesion in the left frontal lobe with surrounding susceptibility compatible with hemosiderin ring. This lesion is compatible with a cavernous hemangioma. There is no evidence for neoplasm or a more ominous vascular lesion. There may have been a recent repeat hemorrhage of the lesion. 2. No additional lesions are present. 3. Minimal sinus disease.  CBC unremarkable, TSH 3.38 within normal limits, CMP with creatinine of 0.71. Labs from January 2017.  Review of Systems: Patient complains of symptoms per HPI as well as the following symptoms: No chest pain, no shortness of breath. Pertinent negatives per HPI. All others negative.   Social History   Social History  . Marital Status: Married    Spouse Name: Lanny Hurst  . Number of Children: 3  . Years of Education: 16   Occupational History  . [employer]    Social  History Main Topics  . Smoking status: Never Smoker   . Smokeless tobacco: Never Used  . Alcohol Use: 4.2 oz/week    7 Standard drinks or equivalent per week     Comment:  glass of wine/night  . Drug Use: No   . Sexual Activity:    Partners: Male    Birth Control/ Protection: Other-see comments     Comment: vasectomy   Other Topics Concern  . Not on file   Social History Narrative   Lives with husband, children   Caffeine use:  1 cup coffee/day   Originally from [place] History  Problem Relation Age of Onset  . Prostate cancer Father 32    Metastatic CA to bones  . Hypertension Father   . Alcohol abuse Father   . Heart disease Father   . Cancer Father     Prostate  . Hyperlipidemia Mother   . Diabetes Mother   . Hypertension Brother   . Alcohol abuse Paternal Grandfather   . Heart disease Paternal Grandfather   . Breast cancer Maternal Aunt   . Depression Maternal Aunt   . Depression Maternal Uncle   . Heart disease Paternal Aunt   . Dementia Maternal Grandmother   . Heart disease Maternal Grandfather   . Heart disease Paternal Grandmother   . Depression Maternal Aunt   . Migraines Neg Hx     Past Medical History  Diagnosis Date  . Migraines   . GERD (gastroesophageal reflux disease)   . Heart murmur     as a child  . Hepatitis A   . Complication of anesthesia     bp dropped with epidural  . Contraception     husband vasectomy  . Anxiety 2012  . Panic disorder 2012  . Dysmenorrhea   . Abnormal Pap smear of cervix 2007    CIN 2 + HRHPV/ LEEP  . Vertigo   . Hemangioma, intracranial structures ([place]) 2013    Left Frontal Lobe  . Hypothyroid     right thyroid nodule    Past Surgical History  Procedure Laterality Date  . Tonsillectomy and adenoidectomy  1988  . Dilation and curettage of uterus    . Cervical biopsy  w/ loop electrode excision  2007    CIN 2  . Colposcopy  2007    CIN 2/ HRHPV    Current Outpatient Prescriptions  Medication Sig Dispense Refill  . ACETAMINOPHEN-CAFF-BUTALBITAL 50-500-40 MG CAPS 1/2 tab as needed every 8 hours as needed 10 each 0  . ALPRAZolam (XANAX) 0.25 MG tablet Anxiety. Take 1/2 tablet twice daily if needed  for anxiety. 10 tablet 0  . fluticasone (FLONASE) 50 MCG/ACT nasal spray Place into both nostrils daily.    Marland Kitchen loratadine (CLARITIN) 5 MG chewable tablet Chew 5 mg by mouth as needed.     Marland Kitchen losartan (COZAAR) 25 MG tablet Take 1 tablet (25 mg total) by mouth daily. 30 tablet 5  . Multiple Vitamin (MULTIVITAMIN) tablet Take 1 tablet by mouth daily.    . Thyroid 81.25 MG TABS Take 81.25 mg by mouth every other day.     . vitamin C (ASCORBIC ACID) 500 MG tablet Take 500 mg by mouth daily.     No current facility-administered medications for this visit.    Allergies as of 05/01/2015 - Review Complete 05/01/2015  Allergen Reaction Noted  . Penicillins  02/18/2011  . Sulfa antibiotics Other (See Comments) 03/15/2014    Vitals: BP  143/89 mmHg  Pulse 83  Ht 5' 4.25" (1.632 m)  Wt 171 lb 12.8 oz (77.928 kg)  BMI 29.26 kg/m2 Last Weight:  Wt Readings from Last 1 Encounters:  05/01/15 171 lb 12.8 oz (77.928 kg)   Last Height:   Ht Readings from Last 1 Encounters:  05/01/15 5' 4.25" (1.632 m)    Physical exam: Exam: Gen: NAD, conversant, well nourised, obese, well groomed                     CV: RRR, no MRG. No Carotid Bruits. No peripheral edema, warm, nontender Eyes: Conjunctivae clear without exudates or hemorrhage  Neuro: Detailed Neurologic Exam  Speech:    Speech is normal; fluent and spontaneous with normal comprehension.  Cognition:    The patient is oriented to person, place, and time;     recent and remote memory intact;     language fluent;     normal attention, concentration,     fund of knowledge Cranial Nerves:    The pupils are equal, round, and reactive to light. The fundi are normal and spontaneous venous pulsations are present. Visual fields are full to finger confrontation. Extraocular movements are intact. Trigeminal sensation is intact and the muscles of mastication are normal. The face is symmetric. The palate elevates in the midline. Hearing intact. Voice is  normal. Shoulder shrug is normal. The tongue has normal motion without fasciculations.   Coordination:    Normal finger to nose and heel to shin. Normal rapid alternating movements.   Gait:    Heel-toe and tandem gait are normal.   Motor Observation:    No asymmetry, no atrophy, and no involuntary movements noted. Tone:    Normal muscle tone.    Posture:    Posture is normal. normal erect    Strength:    Strength is V/V in the upper and lower limbs.      Sensation: intact to LT     Reflex Exam:  DTR's:    Deep tendon reflexes in the upper and lower extremities are normal bilaterally.   Toes:    The toes are downgoing bilaterally.   Clonus:    Clonus is absent.      Assessment/Plan:   40 y.o. female here as a referral from Dr. Carollee Herter for hereditary cavernous malformation, family history on her father's side, multiple family members on that side have vascular lesions. This was discovered many years ago and has remained stable and asymptomatic. She has past medical history of migraines, vertigo and panic attacks,hypothyroidism possible graves disease. Discussion regarding cavernous hemangiomas, risk for bleeding, risks of neurosurgical intervention and removal. These lesions do carry a risk of bleeding yearly and are a cause for hemorrhagic events in the brain. Patient has been to neurosurgery and evaluated possible removal of the lesion and at this time she prefers to hold off. She understands the risks of intracranial bleeding. Had a long discussion on things to watch for, symptoms such as headache, focal weakness or sensory changes, encephalopathy, dizziness, gait disturbance, speech changes or anything concerning please procedure directly to the emergency room. Advised tight control of blood pressure. She also has migraines that are well controlled, follow these clinically.  To prevent or relieve headaches, try the following: Cool Compress. Lie down and place a cool compress  on your head.  Avoid headache triggers. If certain foods or odors seem to have triggered your migraines in the past, avoid them. A headache diary might  help you identify triggers.  Include physical activity in your daily routine. Try a daily walk or other moderate aerobic exercise.  Manage stress. Find healthy ways to cope with the stressors, such as delegating tasks on your to-do list.  Practice relaxation techniques. Try deep breathing, yoga, massage and visualization.  Eat regularly. Eating regularly scheduled meals and maintaining a healthy diet might help prevent headaches. Also, drink plenty of fluids.  Follow a regular sleep schedule. Sleep deprivation might contribute to headaches Consider biofeedback. With this mind-body technique, you learn to control certain bodily functions - such as muscle tension, heart rate and blood pressure - to prevent headaches or reduce headache pain.    Proceed to emergency room if you experience new or worsening symptoms or symptoms do not resolve, if you have new neurologic symptoms or if headache is severe, or for any concerning symptom.    Sarina Ill, MD  [facility] [address] [place], [place] [address]  Phone [phone] Fax [phone]

## 2015-05-06 ENCOUNTER — Encounter: Payer: Self-pay | Admitting: Neurology

## 2015-05-06 DIAGNOSIS — G43909 Migraine, unspecified, not intractable, without status migrainosus: Secondary | ICD-10-CM | POA: Insufficient documentation

## 2015-05-06 DIAGNOSIS — D1802 Hemangioma of intracranial structures: Secondary | ICD-10-CM | POA: Insufficient documentation

## 2015-05-08 ENCOUNTER — Telehealth: Payer: Self-pay | Admitting: Neurology

## 2015-05-08 NOTE — Telephone Encounter (Signed)
Patient called, states she forgot to ask Dr. Jaynee Eagles when she was here at her last visit, what exercises she's allowed to do. Is she allowed to jog for long distances? Can she do weight training, light or heavy weight training? States she does marathons, 3k's, 5k's, should she stop doing those or is it okay? Patient advised that response can be sent via e-mail. Patient is aware Dr. Jaynee Eagles is out of the office this week. This can wait until Dr. Jaynee Eagles and Terrence Dupont return.

## 2015-05-14 NOTE — Telephone Encounter (Signed)
Dr Jaynee Eagles- please advise  Called pt back. Relayed Dr Jaynee Eagles message below. Pt understands but wants to know if she can participate in Aerobic exercise 45 min 2-5 times per week. Advised I will have to ask Dr Jaynee Eagles and call her back to advise. She verbalized understanding. She states she walks 1.5 miles per day right now but wants to increase this.

## 2015-05-14 NOTE — Telephone Encounter (Signed)
Please call patient. I wouldn't change her lifestyle too much. Exercise but avoid really strenuous activities such as excessively heavy weightlifting that can cause acute spikes in blood pressure or prolonged valsalva (weights that are so heavy you have to bear down when lifting for example). Otherwise excising is excellent.

## 2015-05-14 NOTE — Telephone Encounter (Signed)
Yes as long as she is not lifting excessive weights. thanks

## 2015-05-15 NOTE — Telephone Encounter (Signed)
Called and relayed Dr Jaynee Eagles message below. Pt verbalized understanding.

## 2015-05-17 MED FILL — LOSARTAN POTASSIUM 25 MG TA: 25 | 90 days supply | Qty: 90 | Fill #1

## 2015-05-24 ENCOUNTER — Telehealth: Payer: Self-pay | Admitting: Family Medicine

## 2015-05-24 NOTE — Telephone Encounter (Signed)
Patient is requesting for an inhaler. [facility]if after hours [facility] on [address] and [address]. [phone].

## 2015-05-24 NOTE — Telephone Encounter (Signed)
Advise patient that appointment is needed. Per pt she only wants appointment at the appointment center  Only. Had Jonelle Sidle to assist with call. Debbie first available was 06/04/15. Pt declined that appt and states that she will see anyone. Called and spoke with Marzetta Board and per [facility] all appointments are currently booked. Offered patient to walk in today or come in from thing in the morning to decrease her wait time. Patient declined all appointments offered

## 2015-06-27 ENCOUNTER — Telehealth: Payer: Self-pay | Admitting: Obstetrics and Gynecology

## 2015-06-27 NOTE — Telephone Encounter (Signed)
Patient is having some vaginal itching and is scheduled for 07/01/15 but would like to be seen tomorrow if possible with Dr. Quincy Simmonds

## 2015-06-27 NOTE — Telephone Encounter (Signed)
Spoke with patient. Appointment moved to tomorrow 06/28/2015 at 12:45 pm with Dr.Silva. She is agreeable to date and time.  Routing to provider for final review. Patient agreeable to disposition. Will close encounter.

## 2015-06-28 ENCOUNTER — Ambulatory Visit (INDEPENDENT_AMBULATORY_CARE_PROVIDER_SITE_OTHER): Payer: 59 | Admitting: Obstetrics and Gynecology

## 2015-06-28 ENCOUNTER — Encounter: Payer: Self-pay | Admitting: Obstetrics and Gynecology

## 2015-06-28 VITALS — BP 130/74 | HR 88 | Ht 64.25 in | Wt 171.4 lb

## 2015-06-28 DIAGNOSIS — N760 Acute vaginitis: Secondary | ICD-10-CM | POA: Diagnosis not present

## 2015-06-28 DIAGNOSIS — R300 Dysuria: Secondary | ICD-10-CM | POA: Diagnosis not present

## 2015-06-28 DIAGNOSIS — E039 Hypothyroidism, unspecified: Secondary | ICD-10-CM

## 2015-06-28 LAB — POCT URINALYSIS DIPSTICK
Bilirubin, UA: NEGATIVE
Blood, UA: NEGATIVE
Glucose, UA: NEGATIVE
Ketones, UA: NEGATIVE
Leukocytes, UA: NEGATIVE
Nitrite, UA: NEGATIVE
Protein, UA: NEGATIVE
Urobilinogen, UA: NEGATIVE
pH, UA: 5

## 2015-06-28 MED ORDER — THYROID 81.25 MG PO TABS: 81.2500 mg | ORAL_TABLET | ORAL | Status: DC

## 2015-06-28 MED ORDER — FLUCONAZOLE 150 MG PO TABS: 150.0000 mg | ORAL_TABLET | Freq: Once | ORAL | Status: DC

## 2015-06-28 MED FILL — FLUCONAZOLE 150 MG TABLET: 150 | 3 days supply | Qty: 2 | Fill #0

## 2015-06-28 MED FILL — NATURE-THROID 81.25 MG TAB: 81.25 | 90 days supply | Qty: 45 | Fill #0

## 2015-06-28 NOTE — Progress Notes (Signed)
Patient ID: Emily [facility], female   DOB: 1975/02/18, 40 y.o.   MRN: [id number] GYNECOLOGY  VISIT   HPI: 40 y.o.   Married  Hispanic  female   [phone] with Patient's last menstrual period was 06/14/2015 (exact date).   here for vaginal irritating/burning and discharge.  Patient doesn't complain of any odor.  Does complain of pain at urethra--possibly dysuria.     Irritation is external.   Uses grape seed oil with intercourse.  If she does not wipe it away immediately get a yeast infection. Did vinegar douche, symptoms resolved.  Then in May prior to menses developed itching and redness and did vinegar wash and symptoms resolved.  Symptoms have now returned.  Used hydrogen perioxide on the external area yesterday.  No recent swimming events.  Sweats a lot.  Asking for her Petra Kuba Thyroid to be filled here.  She will not be returning to her endocrinologist. Last TFTs. - TSH normal 01/30/15.  Urine dip today - negative.   GYNECOLOGIC HISTORY: Patient's last menstrual period was 06/14/2015 (exact date). Contraception:  vasectomy Menopausal hormone therapy:  n/a Last mammogram: 03-29-14 Density C/Neg/BiRads1:[facility] Last pap smear:   03-13-15 Neg:Neg HR HPV        OB History    Gravida Para Term Preterm AB TAB SAB Ectopic Multiple Living   5 3   2 1 1   3          Patient Active Problem List   Diagnosis Date Noted  . Cavernous hemangioma of brain ([place]) 05/06/2015  . Migraine headache 05/06/2015  . Hot flashes 03/13/2014  . Elevated BP 03/13/2014  . Migraines 11/12/2013  . Eczema 11/12/2013  . IBS (irritable bowel syndrome) 11/12/2013  . 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 Hypothyroidism 10/09/2011  . Thyroid nodule 10/02/2011  . Anxiety 10/02/2011    Past Medical History  Diagnosis Date  . Migraines   . GERD (gastroesophageal reflux disease)   . Heart murmur     as a child  . Hepatitis A   .  Complication of anesthesia     bp dropped with epidural  . Contraception     husband vasectomy  . Anxiety 2012  . Panic disorder 2012  . Dysmenorrhea   . Abnormal Pap smear of cervix 2007    CIN 2 + HRHPV/ LEEP  . Vertigo   . Hemangioma, intracranial structures ([place]) 2013    Left Frontal Lobe  . Hypothyroid     right thyroid nodule    Past Surgical History  Procedure Laterality Date  . Tonsillectomy and adenoidectomy  1988  . Dilation and curettage of uterus    . Cervical biopsy  w/ loop electrode excision  2007    CIN 2  . Colposcopy  2007    CIN 2/ HRHPV    Current Outpatient Prescriptions  Medication Sig Dispense Refill  . ACETAMINOPHEN-CAFF-BUTALBITAL 50-500-40 MG CAPS 1/2 tab as needed every 8 hours as needed 10 each 0  . ALPRAZolam (XANAX) 0.25 MG tablet Anxiety. Take 1/2 tablet twice daily if needed for anxiety. 10 tablet 0  . cetirizine (ZYRTEC) 10 MG tablet Take 10 mg by mouth as needed for allergies.    . fluticasone (FLONASE) 50 MCG/ACT nasal spray Place into both nostrils daily.    Marland Kitchen loratadine (CLARITIN) 5 MG chewable tablet Chew 5 mg by mouth as needed.     Marland Kitchen losartan (COZAAR) 25 MG tablet Take 1 tablet (  25 mg total) by mouth daily. 30 tablet 5  . Multiple Vitamin (MULTIVITAMIN) tablet Take 1 tablet by mouth daily.    . Thyroid 81.25 MG TABS Take 81.25 mg by mouth every other day.     . vitamin C (ASCORBIC ACID) 500 MG tablet Take 500 mg by mouth daily.     No current facility-administered medications for this visit.     ALLERGIES: Penicillins; Monistat; and Sulfa antibiotics  Family History  Problem Relation Age of Onset  . Prostate cancer Father 80    Metastatic CA to bones  . Hypertension Father   . Alcohol abuse Father   . Heart disease Father   . Cancer Father     Prostate  . Hyperlipidemia Mother   . Diabetes Mother   . Hypertension Brother   . Alcohol abuse Paternal Grandfather   . Heart disease Paternal Grandfather   . Breast cancer  Maternal Aunt   . Depression Maternal Aunt   . Depression Maternal Uncle   . Heart disease Paternal Aunt   . Dementia Maternal Grandmother   . Heart disease Maternal Grandfather   . Heart disease Paternal Grandmother   . Depression Maternal Aunt   . Migraines Neg Hx     Social History   Social History  . Marital Status: Married    Spouse Name: Lanny Hurst  . Number of Children: 3  . Years of Education: 16   Occupational History  . [employer]    Social History Main Topics  . Smoking status: Never Smoker   . Smokeless tobacco: Never Used  . Alcohol Use: 4.2 oz/week    7 Standard drinks or equivalent per week     Comment:  glass of wine/night  . Drug Use: No  . Sexual Activity:    Partners: Male    Birth Control/ Protection: Other-see comments     Comment: vasectomy   Other Topics Concern  . Not on file   Social History Narrative   Lives with husband, children   Caffeine use:  1 cup coffee/day   Originally from [place]    ROS:  Pertinent items are noted in HPI.  PHYSICAL EXAMINATION:    BP 130/74 mmHg  Pulse 88  Ht 5' 4.25" (1.632 m)  Wt 171 lb 6.4 oz (77.747 kg)  BMI 29.19 kg/m2  LMP 06/14/2015 (Exact Date)    General appearance: alert, cooperative and appears stated age      Pelvic: External genitalia:  Bilateral labia with general erythema consistent with Candida.              Urethra:  normal appearing urethra with no masses, tenderness or lesions              Bartholins and Skenes: normal                 Vagina: normal appearing vagina with normal color and white clumpy discharge, no lesions              Cervix: no lesions          Bimanual Exam:  Uterus:  normal size, contour, position, consistency, mobility, non-tender              Adnexa: no mass, fullness, tenderness            Chaperone was present for exam.  ASSESSMENT  I suspect yeast vulvovaginitis. Atrophic vaginitis also. Hypothyroidism.  On Nature thyroid and normal TSH this year.  ?dysuria.   Urine dip negative.  PLAN  Discussion of vaginitis.  Will send Affirm.  Diflucan 150 mg po.  May repeat in 72 hrs prn.  #2, RF none.  Stop all douching and do not use hydrogen peroxide on the vulva! Discussed lubricants water based and cooking oils for vaginal dryness. Refill Nature thyroid 81.25 mg daily.   Return for vaginitis symptoms for an office evaluation.      An After Visit Summary was printed and given to the patient.  15_____ minutes face to face time of which over 50% was spent in counseling.

## 2015-06-29 LAB — WET PREP BY MOLECULAR PROBE
Candida species: POSITIVE — AB
Gardnerella vaginalis: NEGATIVE
Trichomonas vaginosis: NEGATIVE

## 2015-07-01 ENCOUNTER — Ambulatory Visit: Payer: 59 | Admitting: Obstetrics and Gynecology

## 2015-07-01 ENCOUNTER — Telehealth: Payer: Self-pay | Admitting: Neurology

## 2015-07-01 NOTE — Telephone Encounter (Signed)
Dr Jaynee Eagles- please advise. Pt last seen 05/01/15

## 2015-07-01 NOTE — Telephone Encounter (Signed)
Called pt back. Relayed message below. Pt ok to see NP.  Made f/u for 07/03/15 w/ Hedwig Morton, NP at 1pm, check in 1245pm. Pt verbalized understanding.

## 2015-07-01 NOTE — Telephone Encounter (Signed)
Patient called to advise that she has been having dizzy spells over the past 6 weeks, doesn't know if her hemangioma has a slow leak? Or could this be inner ear? Please call to advise.

## 2015-07-01 NOTE — Telephone Encounter (Signed)
I can't diagnose over the phone, would recommend coming in this week to see an NP. If she has worsening or debilitating symptoms I recommend the ED as always. Can you please check Megan's schedule? Thanks!

## 2015-07-03 ENCOUNTER — Ambulatory Visit: Payer: Self-pay | Admitting: Adult Health

## 2015-07-04 ENCOUNTER — Encounter: Payer: Self-pay | Admitting: Adult Health

## 2015-07-09 ENCOUNTER — Ambulatory Visit: Payer: 59 | Admitting: Family Medicine

## 2015-07-17 ENCOUNTER — Encounter: Payer: Self-pay | Admitting: Neurology

## 2015-07-17 ENCOUNTER — Ambulatory Visit (INDEPENDENT_AMBULATORY_CARE_PROVIDER_SITE_OTHER): Payer: 59 | Admitting: Neurology

## 2015-07-17 VITALS — BP 116/79 | HR 91 | Ht 64.25 in | Wt 172.0 lb

## 2015-07-17 DIAGNOSIS — R42 Dizziness and giddiness: Secondary | ICD-10-CM

## 2015-07-17 DIAGNOSIS — H8111 Benign paroxysmal vertigo, right ear: Secondary | ICD-10-CM

## 2015-07-17 DIAGNOSIS — F419 Anxiety disorder, unspecified: Secondary | ICD-10-CM

## 2015-07-17 MED ORDER — MECLIZINE HCL 25 MG PO TABS: 25.0000 mg | ORAL_TABLET | Freq: Three times a day (TID) | ORAL | Status: DC | PRN

## 2015-07-17 MED ORDER — ALPRAZOLAM 0.25 MG PO TABS: ORAL_TABLET | ORAL | Status: DC

## 2015-07-17 MED ORDER — METHYLPREDNISOLONE 4 MG PO TBPK: ORAL_TABLET | ORAL | Status: DC

## 2015-07-17 MED ORDER — ONDANSETRON 4 MG PO TBDP: 4.0000 mg | ORAL_TABLET | Freq: Three times a day (TID) | ORAL | Status: DC | PRN

## 2015-07-17 MED FILL — METHYLPREDNISOLONE 4 MG TAB: 4 | 6 days supply | Qty: 21 | Fill #0

## 2015-07-17 MED FILL — ALPRAZolam 0.25 MG TABS: 0.25 | 10 days supply | Qty: 10 | Fill #0

## 2015-07-17 NOTE — Progress Notes (Signed)
[facility]   Provider: Dr Jaynee Eagles Referring Provider: Carollee Herter, Alferd Apa, * Primary Care Physician: Elby Beck, FNP  CC: hemangioma  Interval history 07/17/2015:  On April 27th she started getting dizzy. She was doing a "child's pose" yoga and when she got up she got dizzy and keep happening. Vertiginous. No previous illnesses, no fevers. It doesn;t matter which side. She could be laying down. Sometimes when she gets up it happens. She can't do yoga either or lay on her back. She ha nausea. Never had it before. Not this persistent. No vomiting. Tinnitus is louder. She feels like she is training to focus but not blurry or dbl vision.  Discussion with patient about vertigo, the different causes area it appears as though her vertigo is peripheral. Emily Barker performed in the clinic was positive for the right ear. Showed her how to do Epley maneuvers and discussed the pathophysiology of benign positional peripheral vertigo. Discussed diagnosis and management.  HPI: Emily Barker is a 40 y.o. female here as a referral from Dr. Carollee Herter for hereditary cavernous malformation on her father's side, multiple family members on that side have vascular lesions. This was discovered many years ago and has remained stable and asymptomatic. She has past medical history of migraines, vertigo and panic attacks,hypothyroidism graves disease. She gets the migraines on the right side. She has a daily pain in the left frontal area of the head. Very localized pain in the area that improves to hair pulling and pressure. She has spoke to neurosurgeons about removal of her hemangioma and does not want to pursue that at this time. She is here to establish care with a neurologist. She has aching muscles. She snores but she sleeps on her side and not excessively tired during the day, no apneic events. She has a history of CTS but those symptoms have improved. She has monthly migraines that she  feels are well controlled. No new symptoms, no other focal neurologic deficits, no vision changes, no dysarthria or aphasia, no weakness, no sensory changes of her limbs.  Reviewed notes, labs and imaging from outside physicians, which showed:  CT of the head 11/15/2014: Personally reviewed images and agree with the following The posterior left frontal cavernoma is again evident and is unchanged. There is no intracranial hemorrhage. There is no extra-axial fluid collection. There is no evidence of acute infarction. Gray matter and white matter are otherwise unremarkable in appearance. Brain volume is normal for age. No bony abnormality is evident.  IMPRESSION: Unchanged posterior left frontal cavernoma. No acute intracranial findings. Otherwise normal brain.   MRI of the brain in November 2013: Personally reviewed and agree with the following. Also reviewed images with the patient. Comparison: CT head without contrast 12/21/2011.  Findings: A 9 x 10 x 9 mm T2 hyperintense lesion is evident within the left frontal lobe. A well-defined T2 hypointense rim surrounds the lesion with susceptibility. Slight enhancement of the lesion is consistent with vascularity. The lesion is isointense to white matter on T1-weighted images. No additional lesions are present. Significant susceptibility artifact is associated on the diffusion sequence.  No acute infarct is present. No other hemorrhage or mass is evident. No significant white matter disease is present.  Flow is present in the major intracranial arteries. The globes and orbits are intact. Mild mucosal thickening is present maxillary sinuses and ethmoid air cells bilaterally. The mastoid air cells are clear.  IMPRESSION:  1. 10 mm lesion in the left frontal lobe  with surrounding susceptibility compatible with hemosiderin ring. This lesion is compatible with a cavernous hemangioma. There is no evidence for neoplasm or a more ominous  vascular lesion. There may have been a recent repeat hemorrhage of the lesion. 2. No additional lesions are present. 3. Minimal sinus disease.  CBC unremarkable, TSH 3.38 within normal limits, CMP with creatinine of 0.71. Labs from January 2017.  Review of Systems: Patient complains of symptoms per HPI as well as the following symptoms: No chest pain, no shortness of breath. Pertinent negatives per HPI. All others negative.     Social History   Social History  . Marital Status: Married    Spouse Name: Lanny Hurst  . Number of Children: 3  . Years of Education: 16   Occupational History  . [employer]    Social History Main Topics  . Smoking status: Never Smoker   . Smokeless tobacco: Never Used  . Alcohol Use: 4.2 oz/week    7 Standard drinks or equivalent per week     Comment:  glass of wine/night  . Drug Use: No  . Sexual Activity:    Partners: Male    Birth Control/ Protection: Other-see comments     Comment: vasectomy   Other Topics Concern  . Not on file   Social History Narrative   Lives with husband, children   Caffeine use:  1 cup coffee/day   Originally from [place] History  Problem Relation Age of Onset  . Prostate cancer Father 62    Metastatic CA to bones  . Hypertension Father   . Alcohol abuse Father   . Heart disease Father   . Cancer Father     Prostate  . Hyperlipidemia Mother   . Diabetes Mother   . Hypertension Brother   . Alcohol abuse Paternal Grandfather   . Heart disease Paternal Grandfather   . Breast cancer Maternal Aunt   . Depression Maternal Aunt   . Depression Maternal Uncle   . Heart disease Paternal Aunt   . Dementia Maternal Grandmother   . Heart disease Maternal Grandfather   . Heart disease Paternal Grandmother   . Depression Maternal Aunt   . Migraines Neg Hx     Past Medical History  Diagnosis Date  . Migraines   . GERD (gastroesophageal reflux disease)   . Heart murmur     as a child  . Hepatitis A   .  Complication of anesthesia     bp dropped with epidural  . Contraception     husband vasectomy  . Anxiety 2012  . Panic disorder 2012  . Dysmenorrhea   . Abnormal Pap smear of cervix 2007    CIN 2 + HRHPV/ LEEP  . Vertigo   . Hemangioma, intracranial structures ([place]) 2013    Left Frontal Lobe  . Hypothyroid     right thyroid nodule    Past Surgical History  Procedure Laterality Date  . Tonsillectomy and adenoidectomy  1988  . Dilation and curettage of uterus    . Cervical biopsy  w/ loop electrode excision  2007    CIN 2  . Colposcopy  2007    CIN 2/ HRHPV    Current Outpatient Prescriptions  Medication Sig Dispense Refill  . ACETAMINOPHEN-CAFF-BUTALBITAL 50-500-40 MG CAPS 1/2 tab as needed every 8 hours as needed 10 each 0  . ALPRAZolam (XANAX) 0.25 MG tablet Anxiety. Take 1/2 tablet twice daily if needed for anxiety. 10 tablet 5  . cetirizine (  ZYRTEC) 10 MG tablet Take 10 mg by mouth as needed for allergies.    . fluconazole (DIFLUCAN) 150 MG tablet Take 1 tablet (150 mg total) by mouth once. Repeat in 72 hours if still having symptoms. 2 tablet 0  . fluticasone (FLONASE) 50 MCG/ACT nasal spray Place into both nostrils daily.    Marland Kitchen loratadine (CLARITIN) 5 MG chewable tablet Chew 5 mg by mouth as needed.     Marland Kitchen losartan (COZAAR) 25 MG tablet Take 1 tablet (25 mg total) by mouth daily. 30 tablet 5  . Multiple Vitamin (MULTIVITAMIN) tablet Take 1 tablet by mouth daily.    . Thyroid 81.25 MG TABS Take 81.25 mg by mouth every other day. 90 tablet 3  . vitamin C (ASCORBIC ACID) 500 MG tablet Take 500 mg by mouth daily.    . meclizine (ANTIVERT) 25 MG tablet Take 1 tablet (25 mg total) by mouth 3 (three) times daily as needed for dizziness. 15 tablet 5  . methylPREDNISolone (MEDROL DOSEPAK) 4 MG TBPK tablet follow package directions 21 tablet 1  . ondansetron (ZOFRAN ODT) 4 MG disintegrating tablet Take 1 tablet (4 mg total) by mouth every 8 (eight) hours as needed for nausea or  vomiting. 20 tablet 2   No current facility-administered medications for this visit.    Allergies as of 07/17/2015 - Review Complete 07/17/2015  Allergen Reaction Noted  . Penicillins  02/18/2011  . Monistat [miconazole] Itching 06/28/2015  . Sulfa antibiotics Other (See Comments) 03/15/2014    Vitals: BP 116/79 mmHg  Pulse 91  Ht 5' 4.25" (1.632 m)  Wt 172 lb (78.019 kg)  BMI 29.29 kg/m2  LMP 06/14/2015 (Exact Date) Last Weight:  Wt Readings from Last 1 Encounters:  07/17/15 172 lb (78.019 kg)   Last Height:   Ht Readings from Last 1 Encounters:  07/17/15 5' 4.25" (1.632 m)    Neuro: Detailed Neurologic Exam  Speech:  Speech is normal; fluent and spontaneous with normal comprehension.  Cognition:  The patient is oriented to person, place, and time;   recent and remote memory intact;   language fluent;   normal attention, concentration,   fund of knowledge Cranial Nerves:  The pupils are equal, Barker, and reactive to light. The fundi are normal and spontaneous venous pulsations are present. Visual fields are full to finger confrontation. Extraocular movements are intact. Trigeminal sensation is intact and the muscles of mastication are normal. The face is symmetric. The palate elevates in the midline. Hearing intact. Voice is normal. Shoulder shrug is normal. The tongue has normal motion without fasciculations.   Coordination:  Normal finger to nose and heel to shin. Normal rapid alternating movements.   Gait:  Heel-toe and tandem gait are normal.   Motor Observation:  No asymmetry, no atrophy, and no involuntary movements noted. Tone:  Normal muscle tone.   Posture:  Posture is normal. normal erect   Strength:  Strength is V/V in the upper and lower limbs.    Sensation: intact to LT   Reflex Exam:  DTR's:  Deep tendon reflexes in the upper and lower extremities are normal bilaterally.  Toes:  The toes  are downgoing bilaterally.  Clonus:  Clonus is absent.     Assessment/Plan: 40 y.o. female here as a referral from Dr. Carollee Herter for hereditary cavernous malformation, family history on her father's side, multiple family members on that side have vascular lesions. This was discovered many years ago and has remained stable and asymptomatic. She has  past medical history of migraines, vertigo and panic attacks,hypothyroidism possible graves disease. Discussion regarding cavernous hemangiomas, risk for bleeding, risks of neurosurgical intervention and removal. These lesions do carry a risk of bleeding yearly and are a cause for hemorrhagic events in the brain. Patient has been to neurosurgery and evaluated possible removal of the lesion and at this time she prefers to hold off. She understands the risks of intracranial bleeding. Had a long discussion on things to watch for, symptoms such as headache, focal weakness or sensory changes, encephalopathy, dizziness, gait disturbance, speech changes or anything concerning please procedure directly to the emergency room. Advised tight control of blood pressure. She also has migraines that are well controlled, follow these clinically.  - The ninth positional peripheral vertigo with positive Dix-Hallpike in the clinic today right ear, taught her how to do the Epley maneuvers. Recommend vestibular therapy. Alprazolam, and Zofran as needed for vertigo and nausea. Discussed side effects as shown in the patient instructions.  To prevent or relieve headaches, try the following:  Cool Compress. Lie down and place a cool compress on your head.   Avoid headache triggers. If certain foods or odors seem to have triggered your migraines in the past, avoid them. A headache diary might help you identify triggers.   Include physical activity in your daily routine. Try a daily walk or other moderate aerobic exercise.   Manage stress. Find healthy ways to cope  with the stressors, such as delegating tasks on your to-do list.   Practice relaxation techniques. Try deep breathing, yoga, massage and visualization.   Eat regularly. Eating regularly scheduled meals and maintaining a healthy diet might help prevent headaches. Also, drink plenty of fluids.   Follow a regular sleep schedule. Sleep deprivation might contribute to headaches  Consider biofeedback. With this mind-body technique, you learn to control certain bodily functions - such as muscle tension, heart rate and blood pressure - to prevent headaches or reduce headache pain.   Proceed to emergency room if you experience new or worsening symptoms or symptoms do not resolve, if you have new neurologic symptoms or if headache is severe, or for any concerning symptom.  Sarina Ill, MD  [facility] [address] [place], [place] [address]  Phone [phone] Fax [phone]  A total of 30 minutes was spent face-to-face with this patient. Over half this time was spent on counseling patient on the BPPV diagnosis and different diagnostic and therapeutic options available.

## 2015-07-18 NOTE — Patient Instructions (Signed)
Ondansetron oral dissolving tablet What is this medicine? ONDANSETRON (on DAN se tron) is used to treat nausea and vomiting caused by chemotherapy. It is also used to prevent or treat nausea and vomiting after surgery. This medicine may be used for other purposes; ask your health care provider or pharmacist if you have questions. What should I tell my health care provider before I take this medicine? They need to know if you have any of these conditions: -heart disease -history of irregular heartbeat -liver disease -low levels of magnesium or potassium in the blood -an unusual or allergic reaction to ondansetron, granisetron, other medicines, foods, dyes, or preservatives -pregnant or trying to get pregnant -breast-feeding How should I use this medicine? These tablets are made to dissolve in the mouth. Do not try to push the tablet through the foil backing. With dry hands, peel away the foil backing and gently remove the tablet. Place the tablet in the mouth and allow it to dissolve, then swallow. While you may take these tablets with water, it is not necessary to do so. Talk to your pediatrician regarding the use of this medicine in children. Special care may be needed. Overdosage: If you think you have taken too much of this medicine contact a poison control center or emergency room at once. NOTE: This medicine is only for you. Do not share this medicine with others. What if I miss a dose? If you miss a dose, take it as soon as you can. If it is almost time for your next dose, take only that dose. Do not take double or extra doses. What may interact with this medicine? Do not take this medicine with any of the following medications: -apomorphine -certain medicines for fungal infections like fluconazole, itraconazole, ketoconazole, posaconazole, voriconazole -cisapride -dofetilide -dronedarone -pimozide -thioridazine -ziprasidone This medicine may also interact with the following  medications: -carbamazepine -certain medicines for depression, anxiety, or psychotic disturbances -fentanyl -linezolid -MAOIs like Carbex, Eldepryl, Marplan, Nardil, and Parnate -methylene blue (injected into a vein) -other medicines that prolong the QT interval (cause an abnormal heart rhythm) -phenytoin -rifampicin -tramadol This list may not describe all possible interactions. Give your health care provider a list of all the medicines, herbs, non-prescription drugs, or dietary supplements you use. Also tell them if you smoke, drink alcohol, or use illegal drugs. Some items may interact with your medicine. What should I watch for while using this medicine? Check with your doctor or health care professional as soon as you can if you have any sign of an allergic reaction. What side effects may I notice from receiving this medicine? Side effects that you should report to your doctor or health care professional as soon as possible: -allergic reactions like skin rash, itching or hives, swelling of the face, lips, or tongue -breathing problems -confusion -dizziness -fast or irregular heartbeat -feeling faint or lightheaded, falls -fever and chills -loss of balance or coordination -seizures -sweating -swelling of the hands and feet -tightness in the chest -tremors -unusually weak or tired Side effects that usually do not require medical attention (report to your doctor or health care professional if they continue or are bothersome): -constipation or diarrhea -headache This list may not describe all possible side effects. Call your doctor for medical advice about side effects. You may report side effects to FDA at 1-800-FDA-1088. Where should I keep my medicine? Keep out of the reach of children. Store between 2 and 30 degrees C (36 and 86 degrees F). Throw away any unused  medicine after the expiration date. NOTE: This sheet is a summary. It may not cover all possible information. If  you have questions about this medicine, talk to your doctor, pharmacist, or health care provider.    2016, Elsevier/Gold Standard. (2012-10-19 16:21:52)  Alprazolam orally disintegrating tablet What is this medicine? ALPRAZOLAM (al PRAY zoe lam) is a benzodiazepine. It is used to treat anxiety and panic attacks. This medicine may be used for other purposes; ask your health care provider or pharmacist if you have questions. What should I tell my health care provider before I take this medicine? They need to know if you have any of these conditions: -an alcohol or drug abuse problem -bipolar disorder, depression, psychosis or other mental health conditions -glaucoma -kidney or liver disease -lung or breathing disease -myasthenia gravis -Parkinson's disease -porphyria -seizures or a history of seizures -suicidal thoughts -an unusual or allergic reaction to alprazolam, other benzodiazepines, foods, dyes, or preservatives -pregnant or trying to get pregnant -breast-feeding How should I use this medicine? These tablets are made to dissolve in the mouth. Just before taking the tablet, remove the tablet from the bottle with dry hands. Place the tablet on the top of the tongue and allow it to dissolve, then swallow. You may take these tablets with water, but it is not necessary to do so. If only one-half of the tablet is used, the unused portion of the tablet should be safely discarded because it may not remain stable. Do not reuse this portion of the tablet. Discard unused tablets in a safe manner away from children and pets. Talk to your pediatrician regarding the use of this medicine in children. Special care may be needed. Overdosage: If you think you have taken too much of this medicine contact a poison control center or emergency room at once. NOTE: This medicine is only for you. Do not share this medicine with others. What if I miss a dose? If you miss a dose, take it as soon as you can.  If it is almost time for your next dose, take only that dose. Do not take double or extra doses. What may interact with this medicine? Do not take this medicine with any of the following medications: -ketoconazole -itraconazole This medicine may also interact with the following medications: -cimetidine -cyclosporine -ergotamine -female hormones, including contraceptive or birth control pills -grapefruit juice -herbal or dietary supplements like kava kava, melatonin, dehydroepiandrosterone, DHEA, St. John's Wort or valerian -imatinib, STI-571 -isoniazid -levodopa -medicines for depression, mental problems or psychiatric disturbances -prescription pain medicines -rifampin, rifapentine, or rifabutin -some antibiotics like clarithromycin, erythromycin, troleandomycin -some medicines for high blood pressure or heart problems -some medicines for HIV infection or AIDS -some medicines for seizures like carbamazepine, oxcarbazepine, phenobarbital, phenytoin, primidone This list may not describe all possible interactions. Give your health care provider a list of all the medicines, herbs, non-prescription drugs, or dietary supplements you use. Also tell them if you smoke, drink alcohol, or use illegal drugs. Some items may interact with your medicine. What should I watch for while using this medicine? Visit your doctor or health care professional for regular checks on your progress. Your body can become dependent on this medicine. Ask your doctor or health care professional if you still need to take it. If you have been taking this medicine regularly for some time, do not suddenly stop taking it. You must gradually reduce the dose or you may get severe side effects. Ask your doctor or health care professional for  advice. Even after you stop taking this medicine it can still affect your body for several days. You may get drowsy or dizzy. Do not drive, use machinery, or do anything that needs mental  alertness until you know how this medicine affects you. To reduce the risk of dizzy and fainting spells, do not stand or sit up quickly, especially if you are an older patient. Alcohol may increase dizziness and drowsiness. Avoid alcoholic drinks. Do not treat yourself for coughs, colds or allergies without asking your doctor or health care professional for advice. Some ingredients can increase possible side effects. What side effects may I notice from receiving this medicine? Side effects that you should report to your doctor or health care professional as soon as possible: -allergic reactions like skin rash, itching or hives, swelling of the face, lips, or tongue -confusion, forgetfulness -depression -difficulty sleeping -difficulty speaking -feeling faint or lightheaded, falls -mood changes, excitability or aggressive behavior -muscle cramps -trouble passing urine or change in the amount of urine -unusually weak or tired Side effects that usually do not require medical attention (report to your doctor or health care professional if they continue or are bothersome): -changes in appetite -change in sex drive or performance This list may not describe all possible side effects. Call your doctor for medical advice about side effects. You may report side effects to FDA at 1-800-FDA-1088. Where should I keep my medicine? Keep out of the reach of children. This medicine can be abused. Keep your medicine in a safe place to protect it from theft. Do not share this medicine with anyone. Selling or giving away this medicine is dangerous and against the law. Store at room temperature between 15 and 30 degrees C (59 and 86 degrees F). This medicine may cause accidental overdose and death if taken by other adults, children, or pets. Mix any unused medicine with a substance like cat litter or coffee grounds. Then throw the medicine away in a sealed container like a sealed bag or a coffee can with a lid. Do  not use the medicine after the expiration date. NOTE: This sheet is a summary. It may not cover all possible information. If you have questions about this medicine, talk to your doctor, pharmacist, or health care provider.    2016, Elsevier/Gold Standard. (2013-10-03 14:51:07)

## 2015-08-01 DIAGNOSIS — K5732 Diverticulitis of large intestine without perforation or abscess without bleeding: Secondary | ICD-10-CM | POA: Diagnosis not present

## 2015-08-01 DIAGNOSIS — R1084 Generalized abdominal pain: Secondary | ICD-10-CM | POA: Diagnosis not present

## 2015-08-01 DIAGNOSIS — F410 Panic disorder [episodic paroxysmal anxiety] without agoraphobia: Secondary | ICD-10-CM | POA: Diagnosis not present

## 2015-08-01 DIAGNOSIS — E063 Autoimmune thyroiditis: Secondary | ICD-10-CM | POA: Diagnosis not present

## 2015-08-01 DIAGNOSIS — R112 Nausea with vomiting, unspecified: Secondary | ICD-10-CM | POA: Diagnosis not present

## 2015-08-01 DIAGNOSIS — Z882 Allergy status to sulfonamides status: Secondary | ICD-10-CM | POA: Diagnosis not present

## 2015-08-01 DIAGNOSIS — Z880 Allergy status to penicillin: Secondary | ICD-10-CM | POA: Diagnosis not present

## 2015-08-01 DIAGNOSIS — I10 Essential (primary) hypertension: Secondary | ICD-10-CM | POA: Diagnosis not present

## 2015-08-01 DIAGNOSIS — N390 Urinary tract infection, site not specified: Secondary | ICD-10-CM | POA: Diagnosis not present

## 2015-08-19 ENCOUNTER — Other Ambulatory Visit: Payer: Self-pay | Admitting: Family Medicine

## 2015-08-19 DIAGNOSIS — I10 Essential (primary) hypertension: Secondary | ICD-10-CM

## 2015-08-20 MED FILL — LOSARTAN POTASSIUM 25 MG TA: 25 | 30 days supply | Qty: 30 | Fill #0

## 2015-09-19 ENCOUNTER — Other Ambulatory Visit: Payer: Self-pay

## 2015-09-19 DIAGNOSIS — I10 Essential (primary) hypertension: Secondary | ICD-10-CM

## 2015-09-19 NOTE — Telephone Encounter (Signed)
Pt is wondering why she is now getting a months supply of her losartan (COZAAR) 25 MG tablet [id number]. She says she was given 3 months worth last month. She would like clarification on this. Please advise at [phone]

## 2015-09-21 ENCOUNTER — Telehealth: Payer: Self-pay | Admitting: Radiology

## 2015-09-21 DIAGNOSIS — I10 Essential (primary) hypertension: Secondary | ICD-10-CM

## 2015-09-21 MED ORDER — LOSARTAN POTASSIUM 25 MG PO TABS: 25.0000 mg | ORAL_TABLET | Freq: Every day | ORAL | 0 refills | Status: DC

## 2015-09-21 NOTE — Telephone Encounter (Signed)
Advised patient follow up appt is due, she has declined to make appt, I have told her I will ask if we can renew a month supply of her Losartan, until she can find a different provider or she can follow up here. Ok to send in one month supply of her Losartan? She was last her in Jan (one mo supply was sent in July she ran out yesterday)  Pended one month supply for her, told her will let her know

## 2015-10-09 DIAGNOSIS — I10 Essential (primary) hypertension: Secondary | ICD-10-CM | POA: Diagnosis not present

## 2015-10-09 DIAGNOSIS — R21 Rash and other nonspecific skin eruption: Secondary | ICD-10-CM | POA: Diagnosis not present

## 2015-10-09 DIAGNOSIS — Z1239 Encounter for other screening for malignant neoplasm of breast: Secondary | ICD-10-CM | POA: Diagnosis not present

## 2015-10-09 DIAGNOSIS — R350 Frequency of micturition: Secondary | ICD-10-CM | POA: Diagnosis not present

## 2015-10-09 DIAGNOSIS — Z23 Encounter for immunization: Secondary | ICD-10-CM | POA: Diagnosis not present

## 2015-10-09 MED FILL — TRIAMCINOLONE 0.1% OINTMENT: 0.1 | 10 days supply | Qty: 30 | Fill #0

## 2015-10-14 MED FILL — LOSARTAN POTASSIUM 25 MG TA: 25 | 90 days supply | Qty: 90 | Fill #0

## 2015-10-16 NOTE — Telephone Encounter (Signed)
error 

## 2015-10-30 ENCOUNTER — Encounter: Payer: Self-pay | Admitting: Neurology

## 2015-10-30 ENCOUNTER — Ambulatory Visit (INDEPENDENT_AMBULATORY_CARE_PROVIDER_SITE_OTHER): Payer: 59 | Admitting: Neurology

## 2015-10-30 VITALS — BP 133/90 | HR 89 | Ht 65.25 in | Wt 174.0 lb

## 2015-10-30 DIAGNOSIS — R519 Headache, unspecified: Secondary | ICD-10-CM

## 2015-10-30 DIAGNOSIS — Q283 Other malformations of cerebral vessels: Secondary | ICD-10-CM

## 2015-10-30 DIAGNOSIS — F411 Generalized anxiety disorder: Secondary | ICD-10-CM | POA: Diagnosis not present

## 2015-10-30 DIAGNOSIS — G8929 Other chronic pain: Secondary | ICD-10-CM

## 2015-10-30 DIAGNOSIS — R42 Dizziness and giddiness: Secondary | ICD-10-CM | POA: Diagnosis not present

## 2015-10-30 DIAGNOSIS — R51 Headache: Secondary | ICD-10-CM | POA: Diagnosis not present

## 2015-10-30 MED ORDER — METHYLPREDNISOLONE 4 MG PO TBPK: ORAL_TABLET | ORAL | 3 refills | Status: DC

## 2015-10-30 NOTE — Patient Instructions (Signed)
Remember to drink plenty of fluid, eat healthy meals and do not skip any meals. Try to eat protein with a every meal and eat a healthy snack such as fruit or nuts in between meals. Try to keep a regular sleep-wake schedule and try to exercise daily, particularly in the form of walking, 20-30 minutes a day, if you can.   I would like to see you back in 9 months to a year, sooner if we need to. Please call us with any interim questions, concerns, problems, updates or refill requests.    Our phone number is [phone]. We also have an after hours call service for urgent matters and there is a physician on-call for urgent questions. For any emergencies you know to call 911 or go to the nearest emergency room

## 2015-10-30 NOTE — Progress Notes (Signed)
[facility]    Provider:  Dr Jaynee Eagles Referring Provider: Elby Beck, FNP Primary Care Physician:  Elby Beck, FNP  CC: hemangioma  Interval history 10/30/2015: Emily Barker is a 40 y.o. female here as a referral from Dr. Carollee Herter for hereditary cavernous malformation. The vertigo resolved with steroids. No dizziness. She has done yoga and she is fine. She had shingles in a right thoracic dermatome.She has been under stress. No more daily headaches. Her migraines are stable. She has been doing a lot of driving lately and this stresses her out.  She has tried celexa in the past. She had intoelrable side effects very quickly. She has xanax prn. Anxiety has improved, she does not feel the need for an ssri.    Interval history 07/17/2015:  On April 27th she started getting dizzy. She was doing a "child's pose" yoga and when she got up she got dizzy and keep happening. Vertiginous. No previous illnesses, no fevers. It doesn;t matter which side. She could be laying down. Sometimes when she gets up it happens. She can't do yoga either or lay on her back. She ha nausea. Never had it before. Not this persistent. No vomiting. Tinnitus is louder. She feels like she is training to focus but not blurry or dbl vision.   Discussion with patient about vertigo, the different causes area it appears as though her vertigo is peripheral. Emily Barker performed in the clinic was positive for the right ear. Showed her how to do Epley maneuvers and discussed the pathophysiology of benign positional peripheral vertigo. Discussed diagnosis and management.  HPI: Emily Barker is a 40 y.o. female here as a referral from Dr. Carollee Herter for hereditary cavernous malformation on her father's side, multiple family members on that side have vascular lesions. This was discovered many years ago and has remained stable and asymptomatic. She has past medical history of migraines, vertigo and  panic attacks,hypothyroidism graves disease. She gets the migraines on the right side. She has a daily pain in the left frontal area of the head. Very localized pain in the area that improves to hair pulling and pressure. She has spoke to neurosurgeons about removal of her hemangioma and does not want to pursue that at this time. She is here to establish care with a neurologist. She has aching muscles. She snores but she sleeps on her side and not excessively tired during the day, no apneic events. She has a history of CTS but those symptoms have improved. She has monthly migraines that she feels are well controlled. No new symptoms, no other focal neurologic deficits, no vision changes, no dysarthria or aphasia, no weakness, no sensory changes of her limbs.  Reviewed notes, labs and imaging from outside physicians, which showed:  CT of the head 11/15/2014: Personally reviewed images and agree with the following The posterior left frontal cavernoma is again evident and is unchanged. There is no intracranial hemorrhage. There is no extra-axial fluid collection. There is no evidence of acute infarction. Gray matter and white matter are otherwise unremarkable in appearance. Brain volume is normal for age. No bony abnormality is evident.  IMPRESSION: Unchanged posterior left frontal cavernoma. No acute intracranial findings. Otherwise normal brain.   MRI of the brain in November 2013: Personally reviewed and agree with the following. Also reviewed images with the patient. Comparison: CT head without contrast 12/21/2011.  Findings: A 9 x 10 x 9 mm T2 hyperintense lesion is evident within the left frontal lobe.  A well-defined T2 hypointense rim surrounds the lesion with susceptibility. Slight enhancement of the lesion is consistent with vascularity. The lesion is isointense to white matter on T1-weighted images. No additional lesions are present. Significant susceptibility artifact is associated  on the diffusion sequence.  No acute infarct is present. No other hemorrhage or mass is evident. No significant white matter disease is present.  Flow is present in the major intracranial arteries. The globes and orbits are intact. Mild mucosal thickening is present maxillary sinuses and ethmoid air cells bilaterally. The mastoid air cells are clear.  IMPRESSION:  1. 10 mm lesion in the left frontal lobe with surrounding susceptibility compatible with hemosiderin ring. This lesion is compatible with a cavernous hemangioma. There is no evidence for neoplasm or a more ominous vascular lesion. There may have been a recent repeat hemorrhage of the lesion. 2. No additional lesions are present. 3. Minimal sinus disease.  CBC unremarkable, TSH 3.38 within normal limits, CMP with creatinine of 0.71. Labs from January 2017.  Review of Systems: Patient complains of symptoms per HPI as well as the following symptoms: No chest pain, no shortness of breath. Pertinent negatives per HPI. All others negative.   Social History   Social History  . Marital status: Married    Spouse name: Lanny Hurst  . Number of children: 3  . Years of education: 51   Occupational History  . [employer]    Social History Main Topics  . Smoking status: Never Smoker  . Smokeless tobacco: Never Used  . Alcohol use 4.2 oz/week    7 Standard drinks or equivalent per week     Comment:  glass of wine/night  . Drug use: No  . Sexual activity: Yes    Partners: Male    Birth control/ protection: Other-see comments     Comment: vasectomy   Other Topics Concern  . Not on file   Social History Narrative   Lives with husband, children   Caffeine use:  1 cup coffee/day   Originally from [place] History  Problem Relation Age of Onset  . Prostate cancer Father 89    Metastatic CA to bones  . Hypertension Father   . Alcohol abuse Father   . Heart disease Father   . Cancer Father     Prostate  .  Hyperlipidemia Mother   . Diabetes Mother   . Alcohol abuse Paternal Grandfather   . Heart disease Paternal Grandfather   . Dementia Maternal Grandmother   . Heart disease Maternal Grandfather   . Heart disease Paternal Grandmother   . Hypertension Brother   . Breast cancer Maternal Aunt   . Depression Maternal Aunt   . Depression Maternal Uncle   . Heart disease Paternal Aunt   . Depression Maternal Aunt   . Migraines Neg Hx     Past Medical History:  Diagnosis Date  . Abnormal Pap smear of cervix 2007   CIN 2 + HRHPV/ LEEP  . Anxiety 2012  . Complication of anesthesia    bp dropped with epidural  . Contraception    husband vasectomy  . Dysmenorrhea   . GERD (gastroesophageal reflux disease)   . Heart murmur    as a child  . Hemangioma, intracranial structures ([place]) 2013   Left Frontal Lobe  . Hepatitis A   . Hypothyroid    right thyroid nodule  . Migraines   . Panic disorder 2012  . Vertigo     Past Surgical  History:  Procedure Laterality Date  . CERVICAL BIOPSY  W/ LOOP ELECTRODE EXCISION  2007   CIN 2  . COLPOSCOPY  2007   CIN 2/ HRHPV  . DILATION AND CURETTAGE OF UTERUS    . TONSILLECTOMY AND ADENOIDECTOMY  1988    Current Outpatient Prescriptions  Medication Sig Dispense Refill  . ACETAMINOPHEN-CAFF-BUTALBITAL 50-500-40 MG CAPS 1/2 tab as needed every 8 hours as needed 10 each 0  . ALPRAZolam (XANAX) 0.25 MG tablet Anxiety. Take 1/2 tablet twice daily if needed for anxiety. 10 tablet 5  . cetirizine (ZYRTEC) 10 MG tablet Take 10 mg by mouth as needed for allergies.    . fluticasone (FLONASE) 50 MCG/ACT nasal spray Place into both nostrils daily.    Marland Kitchen loratadine (CLARITIN) 5 MG chewable tablet Chew 5 mg by mouth as needed.     Marland Kitchen losartan (COZAAR) 25 MG tablet Take 1 tablet (25 mg total) by mouth daily. 30 tablet 0  . Multiple Vitamin (MULTIVITAMIN) tablet Take 1 tablet by mouth daily.    . Thyroid 81.25 MG TABS Take 81.25 mg by mouth every other day. 90  tablet 3  . vitamin C (ASCORBIC ACID) 500 MG tablet Take 500 mg by mouth daily.    . meclizine (ANTIVERT) 25 MG tablet Take 1 tablet (25 mg total) by mouth 3 (three) times daily as needed for dizziness. (Patient not taking: Reported on 10/30/2015) 15 tablet 5  . ondansetron (ZOFRAN ODT) 4 MG disintegrating tablet Take 1 tablet (4 mg total) by mouth every 8 (eight) hours as needed for nausea or vomiting. (Patient not taking: Reported on 10/30/2015) 20 tablet 2   No current facility-administered medications for this visit.     Allergies as of 10/30/2015 - Review Complete 10/30/2015  Allergen Reaction Noted  . Penicillins  02/18/2011  . Monistat [miconazole] Itching 06/28/2015  . Sulfa antibiotics Other (See Comments) 03/15/2014    Vitals: BP 133/90 (BP Location: Right Arm, Patient Position: Sitting, Cuff Size: Normal)   Pulse 89   Ht 5' 5.25" (1.657 m)   Wt 174 lb (78.9 kg)   BMI 28.73 kg/m  Last Weight:  Wt Readings from Last 1 Encounters:  10/30/15 174 lb (78.9 kg)   Last Height:   Ht Readings from Last 1 Encounters:  10/30/15 5' 5.25" (1.657 m)     Neuro: Detailed Neurologic Exam  Speech:  Speech is normal; fluent and spontaneous with normal comprehension.  Cognition:  The patient is oriented to person, place, and time;   recent and remote memory intact;   language fluent;   normal attention, concentration,   fund of knowledge Cranial Nerves:  The pupils are equal, Barker, and reactive to light. The fundi are normal and spontaneous venous pulsations are present. Visual fields are full to finger confrontation. Extraocular movements are intact. Trigeminal sensation is intact and the muscles of mastication are normal. The face is symmetric. The palate elevates in the midline. Hearing intact. Voice is normal. Shoulder shrug is normal. The tongue has normal motion without fasciculations.   Coordination:  Normal finger to nose and heel to shin. Normal  rapid alternating movements.   Gait:  Heel-toe and tandem gait are normal.   Motor Observation:  No asymmetry, no atrophy, and no involuntary movements noted. Tone:  Normal muscle tone.   Posture:  Posture is normal. normal erect   Strength:  Strength is V/V in the upper and lower limbs.    Sensation: intact to LT   Reflex  Exam:  DTR's:  Deep tendon reflexes in the upper and lower extremities are normal bilaterally.  Toes:  The toes are downgoing bilaterally.  Clonus:  Clonus is absent.     Assessment/Plan: 40 y.o. female here as a referral from Dr. Carollee Herter for hereditary cavernous malformation, family history on her father's side, multiple family members on that side have vascular lesions. This was discovered many years ago and has remained stable and asymptomatic. She has past medical history of migraines, vertigo and panic attacks,hypothyroidism possible graves disease. Discussion regarding cavernous hemangiomas, risk for bleeding, risks of neurosurgical intervention and removal. These lesions do carry a risk of bleeding yearly and are a cause for hemorrhagic events in the brain. Patient has been to neurosurgery and evaluated possible removal of the lesion and at this time she prefers to hold off. She understands the risks of intracranial bleeding. Had a long discussion on things to watch for, symptoms such as headache, focal weakness or sensory changes, encephalopathy, dizziness, gait disturbance, speech changes or anything concerning please procedure directly to the emergency room. Advised tight control of blood pressure. She also has migraines that are well controlled, follow these clinically.  - The ninth positional peripheral vertigo with positive Dix-Hallpike in the clinic today right ear, taught her how to do the Epley maneuvers. Recommend vestibular therapy. Alprazolam, and Zofran as needed for vertigo and nausea. Discussed side  effects as shown in the patient instructions.  To prevent or relieve headaches, try the following:  Cool Compress. Lie down and place a cool compress on your head.   Avoid headache triggers. If certain foods or odors seem to have triggered your migraines in the past, avoid them. A headache diary might help you identify triggers.   Include physical activity in your daily routine. Try a daily walk or other moderate aerobic exercise.   Manage stress. Find healthy ways to cope with the stressors, such as delegating tasks on your to-do list.   Practice relaxation techniques. Try deep breathing, yoga, massage and visualization.   Eat regularly. Eating regularly scheduled meals and maintaining a healthy diet might help prevent headaches. Also, drink plenty of fluids.   Follow a regular sleep schedule. Sleep deprivation might contribute to headaches  Consider biofeedback. With this mind-body technique, you learn to control certain bodily functions - such as muscle tension, heart rate and blood pressure - to prevent headaches or reduce headache pain.   Proceed to emergency room if you experience new or worsening symptoms or symptoms do not resolve, if you have new neurologic symptoms or if headache is severe, or for any concerning symptom.     Sarina Ill, MD  [facility] [address] [place],  [address]  Phone [phone] Fax [phone]  A total of 30 minutes was spent face-to-face with this patient. Over half this time was spent on counseling patient on the BPPV and angioma diagnosis and different diagnostic and therapeutic options available.

## 2015-10-30 NOTE — Addendum Note (Signed)
Addended by: Sarina Ill B on: 10/30/2015 10:29 AM   Modules accepted: Orders

## 2015-10-30 NOTE — Addendum Note (Signed)
Addended by: Sarina Ill B on: 10/30/2015 10:39 AM   Modules accepted: Orders

## 2015-10-31 ENCOUNTER — Ambulatory Visit: Payer: 59 | Admitting: Neurology

## 2015-11-05 ENCOUNTER — Other Ambulatory Visit: Payer: Self-pay | Admitting: Obstetrics and Gynecology

## 2015-11-05 DIAGNOSIS — Z1231 Encounter for screening mammogram for malignant neoplasm of breast: Secondary | ICD-10-CM

## 2015-11-27 ENCOUNTER — Ambulatory Visit: Payer: 59

## 2015-11-28 ENCOUNTER — Ambulatory Visit: Payer: 59

## 2016-01-07 ENCOUNTER — Ambulatory Visit: Payer: 59

## 2016-01-16 MED FILL — LOSARTAN POTASSIUM 25 MG TA: 25 | 90 days supply | Qty: 90 | Fill #0 | Status: TO

## 2016-02-18 ENCOUNTER — Other Ambulatory Visit: Payer: Self-pay | Admitting: Nurse Practitioner

## 2016-02-18 DIAGNOSIS — I10 Essential (primary) hypertension: Secondary | ICD-10-CM | POA: Insufficient documentation

## 2016-02-18 DIAGNOSIS — E039 Hypothyroidism, unspecified: Secondary | ICD-10-CM | POA: Diagnosis not present

## 2016-02-18 DIAGNOSIS — Z1231 Encounter for screening mammogram for malignant neoplasm of breast: Secondary | ICD-10-CM

## 2016-02-18 DIAGNOSIS — Z0000 Encounter for general adult medical examination without abnormal findings: Secondary | ICD-10-CM | POA: Diagnosis not present

## 2016-02-18 MED FILL — NATURE-THROID 81.25 MG TAB: 81.25 | 30 days supply | Qty: 30 | Fill #0

## 2016-02-28 ENCOUNTER — Ambulatory Visit
Admission: RE | Admit: 2016-02-28 | Discharge: 2016-02-28 | Disposition: A | Discharge status: Home / Self Care | Payer: 59 | Source: Ambulatory Visit | Attending: Nurse Practitioner | Admitting: Nurse Practitioner

## 2016-02-28 DIAGNOSIS — Z1231 Encounter for screening mammogram for malignant neoplasm of breast: Secondary | ICD-10-CM

## 2016-04-14 MED FILL — LOSARTAN POTASSIUM 25 MG TA: 25 | 90 days supply | Qty: 90 | Fill #1 | Status: TO

## 2016-07-08 NOTE — Telephone Encounter (Signed)
error 

## 2016-07-10 MED FILL — LOSARTAN POTASSIUM 25 MG TA: 25 | 90 days supply | Qty: 90 | Fill #2 | Status: TO

## 2016-07-15 MED FILL — NATURE-THROID 48.75 MG TAB: 48.75 | 90 days supply | Qty: 90 | Fill #0

## 2016-09-07 DIAGNOSIS — E039 Hypothyroidism, unspecified: Secondary | ICD-10-CM | POA: Diagnosis not present

## 2016-09-07 DIAGNOSIS — R202 Paresthesia of skin: Secondary | ICD-10-CM | POA: Diagnosis not present

## 2016-09-07 DIAGNOSIS — R200 Anesthesia of skin: Secondary | ICD-10-CM | POA: Diagnosis not present

## 2016-09-07 DIAGNOSIS — I10 Essential (primary) hypertension: Secondary | ICD-10-CM | POA: Diagnosis not present

## 2016-10-28 MED FILL — LOSARTAN POTASSIUM 25 MG TA: 25 | 90 days supply | Qty: 90 | Fill #0

## 2017-01-25 MED FILL — LOSARTAN POTASSIUM 25 MG TA: 25 | 90 days supply | Qty: 90 | Fill #0

## 2017-01-25 MED FILL — NATURE-THROID 48.75 MG TAB: 48.75 | 90 days supply | Qty: 90 | Fill #0

## 2017-02-19 DIAGNOSIS — E039 Hypothyroidism, unspecified: Secondary | ICD-10-CM | POA: Diagnosis not present

## 2017-02-19 DIAGNOSIS — I10 Essential (primary) hypertension: Secondary | ICD-10-CM | POA: Diagnosis not present

## 2017-02-19 DIAGNOSIS — Z0000 Encounter for general adult medical examination without abnormal findings: Secondary | ICD-10-CM | POA: Diagnosis not present

## 2017-02-22 DIAGNOSIS — Z0000 Encounter for general adult medical examination without abnormal findings: Secondary | ICD-10-CM | POA: Diagnosis not present

## 2017-03-02 ENCOUNTER — Other Ambulatory Visit: Payer: Self-pay | Admitting: Obstetrics and Gynecology

## 2017-03-02 DIAGNOSIS — Z1231 Encounter for screening mammogram for malignant neoplasm of breast: Secondary | ICD-10-CM

## 2017-03-19 ENCOUNTER — Ambulatory Visit: Payer: 59

## 2017-04-27 MED FILL — LOSARTAN POTASSIUM 25 MG TA: 25 | 90 days supply | Qty: 90 | Fill #0

## 2017-05-04 DIAGNOSIS — Z0100 Encounter for examination of eyes and vision without abnormal findings: Secondary | ICD-10-CM | POA: Diagnosis not present

## 2017-06-18 ENCOUNTER — Ambulatory Visit
Admission: RE | Admit: 2017-06-18 | Discharge: 2017-06-18 | Disposition: A | Discharge status: Home / Self Care | Payer: 59 | Source: Ambulatory Visit | Attending: Obstetrics and Gynecology | Admitting: Obstetrics and Gynecology

## 2017-06-18 DIAGNOSIS — Z1231 Encounter for screening mammogram for malignant neoplasm of breast: Secondary | ICD-10-CM

## 2017-06-26 ENCOUNTER — Telehealth: Payer: 59 | Admitting: Family

## 2017-06-26 DIAGNOSIS — B373 Candidiasis of vulva and vagina: Secondary | ICD-10-CM

## 2017-06-26 DIAGNOSIS — B3731 Acute candidiasis of vulva and vagina: Secondary | ICD-10-CM

## 2017-06-26 MED ORDER — FLUCONAZOLE 150 MG PO TABS: 150.0000 mg | ORAL_TABLET | Freq: Once | ORAL | 0 refills | Status: DC

## 2017-06-26 MED ORDER — FLUCONAZOLE 150 MG PO TABS: 150.0000 mg | ORAL_TABLET | Freq: Every day | ORAL | 0 refills | Status: DC

## 2017-06-26 NOTE — Progress Notes (Signed)

## 2017-06-26 NOTE — Addendum Note (Signed)
Addended by: Benjamine Mola on: 06/26/2017 09:45 AM   Modules accepted: Orders

## 2017-07-12 ENCOUNTER — Ambulatory Visit: Payer: 59 | Admitting: Obstetrics and Gynecology

## 2017-07-12 ENCOUNTER — Encounter

## 2017-07-12 ENCOUNTER — Other Ambulatory Visit (HOSPITAL_COMMUNITY)
Admission: RE | Admit: 2017-07-12 | Discharge: 2017-07-12 | Disposition: A | Discharge status: Home / Self Care | Payer: 59 | Source: Ambulatory Visit | Attending: Obstetrics and Gynecology | Admitting: Obstetrics and Gynecology

## 2017-07-12 ENCOUNTER — Other Ambulatory Visit: Payer: Self-pay

## 2017-07-12 ENCOUNTER — Encounter: Payer: Self-pay | Admitting: Obstetrics and Gynecology

## 2017-07-12 VITALS — BP 136/88 | HR 84 | Resp 16 | Ht 63.5 in | Wt 180.0 lb

## 2017-07-12 DIAGNOSIS — K649 Unspecified hemorrhoids: Secondary | ICD-10-CM | POA: Diagnosis not present

## 2017-07-12 DIAGNOSIS — R3915 Urgency of urination: Secondary | ICD-10-CM

## 2017-07-12 DIAGNOSIS — Z01419 Encounter for gynecological examination (general) (routine) without abnormal findings: Secondary | ICD-10-CM | POA: Insufficient documentation

## 2017-07-12 DIAGNOSIS — L290 Pruritus ani: Secondary | ICD-10-CM | POA: Diagnosis not present

## 2017-07-12 DIAGNOSIS — N393 Stress incontinence (female) (male): Secondary | ICD-10-CM | POA: Diagnosis not present

## 2017-07-12 DIAGNOSIS — K625 Hemorrhage of anus and rectum: Secondary | ICD-10-CM

## 2017-07-12 NOTE — Progress Notes (Signed)
42 y.o. [id number] Married Hispanic female here for annual exam.    Thinks her bladder is dropping.  Does not feel any bladder pressure.  Urinary frequency worse prior to her menses.  Feels this is an annoyance.  Not having night time urination.  Has leakage with a sneeze or cough.  Voids well.  Does her Kegel's.  Having anal itching.  Using wipes on her bottom. Having BMs three times a week.   Denies blood in stool.  States she has hemorrhoids. Can have bleeding with wiping every 2 or 3 months.  Does not occur with the stools.    Labs with PCP in January 2019.  Getting her masters degree in social work. 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  PCP: Eldridge Abrahams, NP  Patient's last menstrual period was 06/26/2017.           Sexually active: Yes.    The current method of family planning is vasectomy.  Exercising: Yes.    crossfit, circuit training, walking Smoker:  no  Health Maintenance: Pap:  03/13/15 Pap and HR HPV negative History of abnormal Pap:  Yes, 2007 Hx colposcopy and LEEP procedure--CIN 2 with Pos. HR HPV MMG:  06/18/17 BIRADS 1 negative/density c TDaP:  2011 Gardasil:   n/a HIV: negative in pregnancy Hep C: negative in the past Screening Labs: PCP   reports that she has never smoked. She has never used smokeless tobacco. She reports that she drinks about 4.2 oz of alcohol per week. She reports that she does not use drugs.  Past Medical History:  Diagnosis Date  . Abnormal Pap smear of cervix 2007   CIN 2 + HRHPV/ LEEP  . Anxiety 2012  . Complication of anesthesia    bp dropped with epidural  . Contraception    husband vasectomy  . Dysmenorrhea   . GERD (gastroesophageal reflux disease)   . Heart murmur    as a child  . Hemangioma, intracranial structures ([place]) 2013   Left Frontal Lobe  . Hepatitis A   . Hypothyroid    right thyroid nodule  . Migraines   . Panic disorder 2012  . Vertigo     Past Surgical History:  Procedure Laterality Date  . CERVICAL  BIOPSY  W/ LOOP ELECTRODE EXCISION  2007   CIN 2  . COLPOSCOPY  2007   CIN 2/ HRHPV  . DILATION AND CURETTAGE OF UTERUS    . TONSILLECTOMY AND ADENOIDECTOMY  1988    Current Outpatient Medications  Medication Sig Dispense Refill  . ACETAMINOPHEN-CAFF-BUTALBITAL 50-500-40 MG CAPS 1/2 tab as needed every 8 hours as needed 10 each 0  . ALPRAZolam (XANAX) 0.25 MG tablet Anxiety. Take 1/2 tablet twice daily if needed for anxiety. 10 tablet 5  . cetirizine (ZYRTEC) 10 MG tablet Take 10 mg by mouth as needed for allergies.    . fluticasone (FLONASE) 50 MCG/ACT nasal spray Place into both nostrils daily.    Marland Kitchen loratadine (CLARITIN) 5 MG chewable tablet Chew 5 mg by mouth as needed.     Marland Kitchen losartan (COZAAR) 25 MG tablet Take 1 tablet (25 mg total) by mouth daily. 30 tablet 0  . Multiple Vitamin (MULTIVITAMIN) tablet Take 1 tablet by mouth daily.    . Thyroid 81.25 MG TABS Take 81.25 mg by mouth every other day. 90 tablet 3  . vitamin C (ASCORBIC ACID) 500 MG tablet Take 500 mg by mouth daily.    . fluconazole (DIFLUCAN) 150 MG tablet Take 1 tablet (150  mg total) by mouth daily. (Patient not taking: Reported on 07/12/2017) 3 tablet 0   No current facility-administered medications for this visit.     Family History  Problem Relation Age of Onset  . Prostate cancer Father 6       Metastatic CA to bones  . Hypertension Father   . Alcohol abuse Father   . Heart disease Father   . Cancer Father        Prostate  . Hyperlipidemia Mother   . Diabetes Mother   . Alcohol abuse Paternal Grandfather   . Heart disease Paternal Grandfather   . Dementia Maternal Grandmother   . Heart disease Maternal Grandfather   . Heart disease Paternal Grandmother   . Hypertension Brother   . Breast cancer Maternal Aunt   . Depression Maternal Aunt   . Depression Maternal Uncle   . Heart disease Paternal Aunt   . Depression Maternal Aunt   . Migraines Neg Hx     Review of Systems  Constitutional: Negative.    HENT: Negative.   Eyes: Negative.   Respiratory: Negative.   Cardiovascular: Negative.   Gastrointestinal: Negative.   Endocrine: Negative.   Genitourinary: Negative.   Musculoskeletal: Negative.   Skin: Negative.   Allergic/Immunologic: Negative.   Neurological: Negative.   Hematological: Negative.   Psychiatric/Behavioral: Negative.     Exam:   BP 136/88 (BP Location: Right Arm, Patient Position: Sitting, Cuff Size: Normal)   Pulse 84   Resp 16   Ht 5' 3.5" (1.613 m)   Wt 180 lb (81.6 kg)   LMP 06/26/2017   BMI 31.39 kg/m     General appearance: alert, cooperative and appears stated age Head: Normocephalic, without obvious abnormality, atraumatic Neck: no adenopathy, supple, symmetrical, trachea midline and thyroid normal to inspection and right thyroid nodule appreciated - 1.5 cm, nontender.  Lungs: clear to auscultation bilaterally Breasts: normal appearance, no masses or tenderness, No nipple retraction or dimpling, No nipple discharge or bleeding, No axillary or supraclavicular adenopathy Heart: regular rate and rhythm Abdomen: soft, non-tender; no masses, no organomegaly Extremities: extremities normal, atraumatic, no cyanosis or edema Skin: Skin color, texture, turgor normal. No rashes or lesions Lymph nodes: Cervical, supraclavicular, and axillary nodes normal. No abnormal inguinal nodes palpated Neurologic: Grossly normal  Pelvic: External genitalia:  no lesions              Urethra:  normal appearing urethra with no masses, tenderness or lesions              Bartholins and Skenes: normal                 Vagina: normal appearing vagina with normal color and discharge, no lesions              Cervix: no lesions              Pap taken: Yes.   Bimanual Exam:  Uterus:  normal size, contour, position, consistency, mobility, non-tender              Adnexa: no mass, fullness, tenderness              Rectal exam: Yes.  .  Confirms.              Anus:  normal  sphincter tone, no lesions  Chaperone was present for exam.  Assessment:   Well woman visit with normal exam. Hx LEEP.  Stress incontinence.  Urinary urgency.  Rectal itching, bleeding,  and hemorrhoids.  Borderline elevated blood pressure.  On Cozaar.   Plan: Mammogram screening. Recommended self breast awareness. Pap and HR HPV as above. Guidelines for Calcium, Vitamin D, regular exercise program including cardiovascular and weight bearing exercise. Discussed Gardasil vaccine.  She declines.  We discussed stress incontinence and physical therapy, pessary, and surgical treatment options.  She prefers physical therapy, so referral made to [facility].  Referral to GI. Follow up annually and prn.   After visit summary provided.

## 2017-07-12 NOTE — Patient Instructions (Signed)

## 2017-07-14 ENCOUNTER — Encounter: Payer: Self-pay | Admitting: Gastroenterology

## 2017-07-14 ENCOUNTER — Ambulatory Visit: Payer: 59 | Admitting: Obstetrics and Gynecology

## 2017-07-14 LAB — CYTOLOGY - PAP
Diagnosis: NEGATIVE
HPV: NOT DETECTED

## 2017-07-28 ENCOUNTER — Ambulatory Visit: Payer: 59 | Admitting: Nurse Practitioner

## 2017-07-28 ENCOUNTER — Encounter: Payer: Self-pay | Admitting: Nurse Practitioner

## 2017-07-28 VITALS — BP 124/84 | HR 91 | Temp 98.2°F | Ht 63.75 in | Wt 178.0 lb

## 2017-07-28 DIAGNOSIS — I10 Essential (primary) hypertension: Secondary | ICD-10-CM | POA: Diagnosis not present

## 2017-07-28 DIAGNOSIS — E039 Hypothyroidism, unspecified: Secondary | ICD-10-CM | POA: Diagnosis not present

## 2017-07-28 NOTE — Progress Notes (Signed)
Subjective:  Patient ID: Emily Barker, female    DOB: 1975/04/02  Age: 42 y.o. MRN: [id number]  CC: Establish Care (est care/med refills:thyroid and BP --90 days supply)  HPI Last CPE 07/12/2017, done by GYN  Hypothyroidism: No dose change in last 23yrs. Diagnosed 2007. With hashimoto 2014. Thyroid US done 2015: enlarged thyroid No thyroid biopsy done She only wants to use desiccated thyroid. Brother with thyroid dysfunction.  HTN: Diagnosed 2014. Stable with losartan. BP Readings from Last 3 Encounters:  07/28/17 124/84  07/12/17 136/88  10/30/15 133/90   needs medication refilled  Reviewed [facility] today.  Outpatient Medications Prior to Visit  Medication Sig Dispense Refill  . ACETAMINOPHEN-CAFF-BUTALBITAL 50-500-40 MG CAPS 1/2 tab as needed every 8 hours as needed 10 each 0  . cetirizine (ZYRTEC) 10 MG tablet Take 10 mg by mouth as needed for allergies.    . fluticasone (FLONASE) 50 MCG/ACT nasal spray Place into both nostrils daily.    Marland Kitchen loratadine (CLARITIN) 5 MG chewable tablet Chew 5 mg by mouth as needed.     . Multiple Vitamin (MULTIVITAMIN) tablet Take 1 tablet by mouth daily.    . vitamin C (ASCORBIC ACID) 500 MG tablet Take 500 mg by mouth daily.    Marland Kitchen ALPRAZolam (XANAX) 0.25 MG tablet Anxiety. Take 1/2 tablet twice daily if needed for anxiety. 10 tablet 5  . losartan (COZAAR) 25 MG tablet Take 1 tablet (25 mg total) by mouth daily. 30 tablet 0  . Thyroid 81.25 MG TABS Take 81.25 mg by mouth every other day. 90 tablet 3  . fluconazole (DIFLUCAN) 150 MG tablet Take 1 tablet (150 mg total) by mouth daily. (Patient not taking: Reported on 07/12/2017) 3 tablet 0   No facility-administered medications prior to visit.     ROS Review of Systems  Constitutional: Negative for malaise/fatigue.  Cardiovascular: Negative for chest pain, palpitations and leg swelling.  Gastrointestinal: Negative for constipation and diarrhea.  Musculoskeletal: Negative for myalgias.    Skin: Negative.   Neurological: Negative for sensory change.  Endo/Heme/Allergies: Negative for polydipsia.  Psychiatric/Behavioral: Negative for depression. The patient is not nervous/anxious.    Objective:  BP 124/84   Pulse 91   Temp 98.2 F (36.8 C) (Oral)   Ht 5' 3.75" (1.619 m)   Wt 178 lb (80.7 kg)   LMP 07/19/2017   SpO2 98%   BMI 30.79 kg/m   BP Readings from Last 3 Encounters:  07/28/17 124/84  07/12/17 136/88  10/30/15 133/90    Wt Readings from Last 3 Encounters:  07/28/17 178 lb (80.7 kg)  07/12/17 180 lb (81.6 kg)  10/30/15 174 lb (78.9 kg)   Physical Exam  Constitutional: She is oriented to person, place, and time. No distress.  Neck: Thyromegaly present.  Cardiovascular: Normal rate, regular rhythm and normal heart sounds.  Pulmonary/Chest: Effort normal and breath sounds normal.  Musculoskeletal: She exhibits no edema.  Lymphadenopathy:    She has no cervical adenopathy.  Neurological: She is alert and oriented to person, place, and time.  Skin: Skin is warm and dry.  Psychiatric: She has a normal mood and affect. Her behavior is normal. Thought content normal.  Vitals reviewed.   Lab Results  Component Value Date   WBC 7.9 01/30/2015   HGB 12.6 01/30/2015   HCT 39.2 01/30/2015   PLT 244 01/30/2015   GLUCOSE 98 07/28/2017   CHOL 197 01/30/2015   TRIG 120 01/30/2015   HDL 59 01/30/2015   LDLCALC 114  01/30/2015   ALT 11 01/30/2015   AST 14 01/30/2015   NA 138 07/28/2017   K 4.0 07/28/2017   CL 105 07/28/2017   CREATININE 1.12 (H) 07/28/2017   BUN 20 07/28/2017   CO2 25 07/28/2017   TSH 2.49 07/28/2017    Assessment & Plan:   Emily Barker was seen today for establish care.  Diagnoses and all orders for this visit:  Essential hypertension -     Cancel: Basic metabolic panel -     Basic metabolic panel -     losartan (COZAAR) 25 MG tablet; Take 1 tablet (25 mg total) by mouth daily.  Hypothyroidism, unspecified type -     Cancel:  TSH -     Cancel: T4, free -     Cancel: T3, free -     Thyroid Panel With TSH -     Thyroid 81.25 MG TABS; Take 81.25 mg by mouth every other day.   I have discontinued Emily Barker's ALPRAZolam and fluconazole. I am also having her maintain her loratadine, fluticasone, multivitamin, vitamin C, ACETAMINOPHEN-CAFF-BUTALBITAL, cetirizine, losartan, and Thyroid.  Meds ordered this encounter  Medications  . losartan (COZAAR) 25 MG tablet    Sig: Take 1 tablet (25 mg total) by mouth daily.    Dispense:  90 tablet    Refill:  1    Order Specific Question:   Supervising Provider    Answer:   Lucille Passy [[id number]]  . Thyroid 81.25 MG TABS    Sig: Take 81.25 mg by mouth every other day.    Dispense:  90 tablet    Refill:  1    Order Specific Question:   Supervising Provider    Answer:   Lucille Passy [[id number]]    Follow-up: Return in about 6 months (around 01/28/2018) for HTN and Hypothyroidism (fasting, repeat BMP and  lipid panel).  Wilfred Lacy, NP

## 2017-07-28 NOTE — Patient Instructions (Addendum)
Stable thyroid function. Refills sent BMP indicates mild elevation in creatinine. Push oral hydration, avoid use of NSAIDs.  make 6 months f/up appt for repeat BMP and re eval of BP. Maintain losartan

## 2017-07-29 ENCOUNTER — Other Ambulatory Visit: Payer: Self-pay | Admitting: Physician Assistant

## 2017-07-29 ENCOUNTER — Encounter: Payer: Self-pay | Admitting: Nurse Practitioner

## 2017-07-29 DIAGNOSIS — I10 Essential (primary) hypertension: Secondary | ICD-10-CM

## 2017-07-29 LAB — BASIC METABOLIC PANEL
BUN/Creatinine Ratio: 18 (calc) (ref 6–22)
BUN: 20 mg/dL (ref 7–25)
CO2: 25 mmol/L (ref 20–32)
Calcium: 9.6 mg/dL (ref 8.6–10.2)
Chloride: 105 mmol/L (ref 98–110)
Creat: 1.12 mg/dL — ABNORMAL HIGH (ref 0.50–1.10)
Glucose, Bld: 98 mg/dL (ref 65–99)
Potassium: 4 mmol/L (ref 3.5–5.3)
Sodium: 138 mmol/L (ref 135–146)

## 2017-07-29 LAB — THYROID PANEL WITH TSH
Free Thyroxine Index: 2 (ref 1.4–3.8)
T3 Uptake: 27 % (ref 22–35)
T4, Total: 7.3 ug/dL (ref 5.1–11.9)
TSH: 2.49 mIU/L

## 2017-07-29 MED ORDER — LOSARTAN POTASSIUM 25 MG PO TABS: 25.0000 mg | ORAL_TABLET | Freq: Every day | ORAL | 1 refills | Status: DC

## 2017-07-29 MED ORDER — THYROID 81.25 MG PO TABS: 81.2500 mg | ORAL_TABLET | ORAL | 1 refills | Status: DC

## 2017-07-30 ENCOUNTER — Telehealth: Payer: Self-pay | Admitting: Nurse Practitioner

## 2017-07-30 NOTE — Telephone Encounter (Signed)
[facility] stating Thyroid 81.25 is on back order and currently unavailable.   Emily Barker is aware, pt needs to find out which pharmacy has this med and we will send in according to her responds.

## 2017-08-02 MED ORDER — THYROID 32.5 MG PO TABS: 32.5000 mg | ORAL_TABLET | Freq: Every day | ORAL | 1 refills | Status: DC

## 2017-08-02 MED ORDER — THYROID 48.75 MG PO TABS: 48.7500 mg | ORAL_TABLET | Freq: Every day | ORAL | 1 refills | Status: DC

## 2017-08-02 MED FILL — NATURE-THROID 32.5 MG TAB: 32.5 | 90 days supply | Qty: 90 | Fill #0

## 2017-08-02 MED FILL — NATURE-THROID 48.75 MG TAB: 48.75 | 90 days supply | Qty: 90 | Fill #0

## 2017-08-02 NOTE — Telephone Encounter (Signed)
Pt states the pharmacy advised her they have a 48.25 mg  and 32.5 mg and send in a Rx for both of these (together they add up to the 81.25) OR  send in a Rx for the 48.25 and she can take daily instead of every other day.  [place] [place] - [address]. [phone] (Phone) [phone] (Fax)

## 2017-08-02 NOTE — Telephone Encounter (Signed)
Please advise 

## 2017-08-02 NOTE — Telephone Encounter (Signed)
Rx sent as request. Thyroid 32.5 sent and Thyroid 48.75 sent (no Thyroid 48.25 to choose in system). Ok per [place].

## 2017-08-02 NOTE — Telephone Encounter (Signed)
Ok to send 48.25 and 32.5mg 

## 2017-08-02 NOTE — Telephone Encounter (Signed)
Left vm inform the pt.

## 2017-08-27 MED FILL — LOSARTAN POTASSIUM 25 MG TA: 25 | 90 days supply | Qty: 90 | Fill #0

## 2017-09-17 ENCOUNTER — Encounter: Payer: Self-pay | Admitting: Gastroenterology

## 2017-09-17 ENCOUNTER — Ambulatory Visit (INDEPENDENT_AMBULATORY_CARE_PROVIDER_SITE_OTHER): Payer: 59 | Admitting: Gastroenterology

## 2017-09-17 VITALS — BP 134/86 | HR 88 | Ht 64.75 in | Wt 179.6 lb

## 2017-09-17 DIAGNOSIS — K389 Disease of appendix, unspecified: Secondary | ICD-10-CM

## 2017-09-17 DIAGNOSIS — K648 Other hemorrhoids: Secondary | ICD-10-CM

## 2017-09-17 DIAGNOSIS — R1110 Vomiting, unspecified: Secondary | ICD-10-CM

## 2017-09-17 DIAGNOSIS — R12 Heartburn: Secondary | ICD-10-CM | POA: Diagnosis not present

## 2017-09-17 DIAGNOSIS — Z8719 Personal history of other diseases of the digestive system: Secondary | ICD-10-CM | POA: Diagnosis not present

## 2017-09-17 DIAGNOSIS — K219 Gastro-esophageal reflux disease without esophagitis: Secondary | ICD-10-CM | POA: Diagnosis not present

## 2017-09-17 DIAGNOSIS — K625 Hemorrhage of anus and rectum: Secondary | ICD-10-CM | POA: Diagnosis not present

## 2017-09-17 MED ORDER — NA SULFATE-K SULFATE-MG SULF 17.5-3.13-1.6 GM/177ML PO SOLN: 1.0000 | Freq: Once | ORAL | 0 refills | Status: AC

## 2017-09-17 MED FILL — SUPREP BOWEL PREP KIT: 17.5-3.13-1 | 1 days supply | Qty: 354 | Fill #0

## 2017-09-17 NOTE — Progress Notes (Signed)
Emily Barker    [id number]    [birth year]  Primary Care Physician:Nche, Charlene Brooke, NP  Referring Physician: Nunzio Cobbs, MD [address] [place], [place] [address]  Chief complaint:  Acid reflux, anal itching, bright red blood per rectum, history of diverticulitis  HPI: 42 year old female here for new patient visit with multiple GI complaints. She has had GERD symptoms secondary to acid reflux, heartburn and regurgitation for a long time.  She was told that she has had issues with regurgitation and spitting up when she was an infant, was fed yogurt instead of milk.  Heartburn and regurgitation worse in the past 5 years.  She always regurgitates after heavy meal or when she drinks fluids with meals.  GERD symptoms somewhat better since she changed diet, is currently vegan.  She was on Nexium many years ago but currently not on any acid suppressive medication and prefers to follow lifestyle changes along with dietary changes and avoid medications. No nausea, vomiting, loss of appetite, weight loss dysphagia or odynophagia. She has had chronic constipation and symptomatic hemorrhoids since she was a child.  She is used to straining excessively during defecation.  Currently is having more soft stool since she changed her diet to vegan. She has intense Anal itching intermittently and feels that she has has dry skin in the perianal area.  She tries to wipe multiple times with moist wipes after a bowel movement as she does not usually feel clean unless she wiped multiple times.  Occasionally she has small-volume bright red blood per rectum when she wipes after a bowel movement. Has had recurrent episodes of diverticulitis, in 2011, 2013 and most recently in 2017. Reviewed CT abd & Pelvis 12/23/2011 showed sigmoid diverticulosis with resolved sigmoid diverticulitis, small appendicolith. She has occasional periumbilical abdominal discomfort She never had  colonoscopy.    Outpatient Encounter Medications as of 09/17/2017  Medication Sig  . ACETAMINOPHEN-CAFF-BUTALBITAL 50-500-40 MG CAPS 1/2 tab as needed every 8 hours as needed  . cetirizine (ZYRTEC) 10 MG tablet Take 10 mg by mouth as needed for allergies.  . fluticasone (FLONASE) 50 MCG/ACT nasal spray Place into both nostrils daily.  Marland Kitchen loratadine (CLARITIN) 5 MG chewable tablet Chew 5 mg by mouth as needed.   Marland Kitchen losartan (COZAAR) 25 MG tablet Take 1 tablet (25 mg total) by mouth daily.  . Multiple Vitamin (MULTIVITAMIN) tablet Take 1 tablet by mouth daily.  Marland Kitchen thyroid (ARMOUR) 32.5 MG tablet Take 1 tablet (32.5 mg total) by mouth daily.  . Thyroid 48.75 MG TABS Take 48.75 mg by mouth daily.  . vitamin C (ASCORBIC ACID) 500 MG tablet Take 500 mg by mouth daily.   No facility-administered encounter medications on file as of 09/17/2017.     Allergies as of 09/17/2017 - Review Complete 07/28/2017  Allergen Reaction Noted  . Penicillins  02/18/2011  . Latex  07/28/2017  . Monistat [miconazole] Itching 06/28/2015  . Sulfa antibiotics Other (See Comments) 03/15/2014    Past Medical History:  Diagnosis Date  . Abnormal Pap smear of cervix 2007   CIN 2 + HRHPV/ LEEP  . Anxiety 2012  . Complication of anesthesia    bp dropped with epidural  . Contraception    husband vasectomy  . Dysmenorrhea   . GERD (gastroesophageal reflux disease)   . Heart murmur    as a child  . Hemangioma, intracranial structures ([place]) 2013   Left  Frontal Lobe  . Hepatitis A   . Hypothyroid    right thyroid nodule  . Migraines   . Panic disorder 2012  . Vertigo     Past Surgical History:  Procedure Laterality Date  . CERVICAL BIOPSY  W/ LOOP ELECTRODE EXCISION  2007   CIN 2  . COLPOSCOPY  2007   CIN 2/ HRHPV  . DILATION AND CURETTAGE OF UTERUS    . TONSILLECTOMY AND ADENOIDECTOMY  1988    Family History  Problem Relation Age of Onset  . Prostate cancer Father 54       Metastatic CA to bones    . Hypertension Father   . Alcohol abuse Father   . Heart disease Father   . Cancer Father        Prostate  . Hyperlipidemia Mother   . Diabetes Mother   . Alcohol abuse Paternal Grandfather   . Heart disease Paternal Grandfather   . Dementia Maternal Grandmother   . Heart disease Maternal Grandfather   . Heart disease Paternal Grandmother   . Hypertension Brother   . Breast cancer Maternal Aunt   . Depression Maternal Aunt   . Depression Maternal Uncle   . Heart disease Paternal Aunt   . Depression Maternal Aunt   . Migraines Neg Hx     Social History   Socioeconomic History  . Marital status: Married    Spouse name: Lanny Hurst  . Number of children: 3  . Years of education: 47  . Highest education level: Not on file  Occupational History  . Occupation: [employer]  Social Needs  . Financial resource strain: Not on file  . Food insecurity:    Worry: Not on file    Inability: Not on file  . Transportation needs:    Medical: Not on file    Non-medical: Not on file  Tobacco Use  . Smoking status: Never Smoker  . Smokeless tobacco: Never Used  Substance and Sexual Activity  . Alcohol use: Yes    Alcohol/week: 7.0 standard drinks    Types: 7 Standard drinks or equivalent per week    Comment:  glass of wine/night  . Drug use: No  . Sexual activity: Yes    Partners: Male    Birth control/protection: Other-see comments    Comment: vasectomy-husband  Lifestyle  . Physical activity:    Days per week: Not on file    Minutes per session: Not on file  . Stress: Not on file  Relationships  . Social connections:    Talks on phone: Not on file    Gets together: Not on file    Attends religious service: Not on file    Active member of club or organization: Not on file    Attends meetings of clubs or organizations: Not on file    Relationship status: Not on file  . Intimate partner violence:    Fear of current or ex partner: Not on file    Emotionally abused: Not on file     Physically abused: Not on file    Forced sexual activity: Not on file  Other Topics Concern  . Not on file  Social History Narrative   Lives with husband, children   Caffeine use:  1 cup coffee/day   Originally from [place]      Review of systems: Review of Systems  Constitutional: Negative for fever and chills.  HENT: Positive for sinus trouble Eyes: Negative for blurred vision.  Respiratory: Negative for cough,  shortness of breath and wheezing.   Cardiovascular: Negative for chest pain and palpitations.  Gastrointestinal: as per HPI Genitourinary: Negative for dysuria and hematuria.  Positive for urinary frequency and stress incontinence Musculoskeletal: Positive for myalgias, back pain and joint pain.  Skin: Negative for itching and rash.  Neurological: Negative for dizziness, tremors, focal weakness, seizures and loss of consciousness.  Endo/Heme/Allergies: Positive for seasonal allergies.  Psychiatric/Behavioral: Negative for depression, suicidal ideas and hallucinations.  Positive for anxiety All other systems reviewed and are negative.   Physical Exam: Vitals:   09/17/17 0845  BP: 134/86  Pulse: 88   Body mass index is 30.12 kg/m. Gen:      No acute distress HEENT:  EOMI, sclera anicteric Neck:     No masses; no thyromegaly Lungs:    Clear to auscultation bilaterally; normal respiratory effort CV:         Regular rate and rhythm; no murmurs Abd:      + bowel sounds; soft, non-tender; no palpable masses, no distension Ext:    No edema; adequate peripheral perfusion Skin:      Warm and dry; no rash Neuro: alert and oriented x 3 Psych: normal mood and affect Rectal exam: Normal anal sphincter tone, no anal fissure or external hemorrhoids Anoscopy: Small internal hemorrhoids grade 2, no active bleeding, normal dentate line, no visible nodules   Data Reviewed:  Reviewed labs, radiology imaging, old records and pertinent past GI work up   Assessment and  Plan/Recommendations:  42 year old female with chronic GERD, history of recurrent diverticulitis, anal rectal discomfort and symptomatic hemorrhoids  GERD continue with antireflux measures and lifestyle modification Currently symptoms are stable and well-controlled  Symptomatic hemorrhoids and intermittent bright red blood per rectum Patient is requesting hemorrhoidal band ligation Will schedule for colonoscopy to exclude any neoplastic lesion and also that she has had multiple recurrent episodes of diverticulitis and has never had  Colonoscopy Small appendicolith noted on CT abdomen pelvis in 2013 The risks and benefits as well as alternatives of endoscopic procedure(s) have been discussed and reviewed. All questions answered. The patient agrees to proceed.  Schedule for hemorrhoidal band ligation after colonoscopy based on findings Bene fiber 1 teaspoon 3 times daily as needed Avoid excessive straining with bowel movement   K. Denzil Magnuson , MD [phone]    CC: Aundria Rud*

## 2017-09-17 NOTE — Patient Instructions (Addendum)
You have been scheduled for a colonoscopy. Please follow written instructions given to you at your visit today.  Please pick up your prep supplies at the pharmacy within the next 1-3 days. If you use inhalers (even only as needed), please bring them with you on the day of your procedure. Your physician has requested that you go to www.startemmi.com and enter the access code given to you at your visit today. This web site gives a general overview about your procedure. However, you should still follow specific instructions given to you by our office regarding your preparation for the procedure.   You have been scheduled for hemorrhoidal bandings after your colonoscopy   11/08/2017 at 3:30pm 11/24/2017 at 3:30pm  If you are age 39 or older, your body mass index should be between 23-30. Your Body mass index is 30.12 kg/m. If this is out of the aforementioned range listed, please consider follow up with your Primary Care Provider.  If you are age 52 or younger, your body mass index should be between 19-25. Your Body mass index is 30.12 kg/m. If this is out of the aformentioned range listed, please consider follow up with your Primary Care Provider.    Thank you for choosing [facility]  Karleen Hampshire Nandigam,MD

## 2017-09-20 ENCOUNTER — Encounter: Payer: Self-pay | Admitting: Gastroenterology

## 2017-10-04 ENCOUNTER — Encounter: Payer: 59 | Admitting: Gastroenterology

## 2017-11-01 DIAGNOSIS — M2241 Chondromalacia patellae, right knee: Secondary | ICD-10-CM | POA: Diagnosis not present

## 2017-11-01 DIAGNOSIS — M2242 Chondromalacia patellae, left knee: Secondary | ICD-10-CM | POA: Diagnosis not present

## 2017-11-08 ENCOUNTER — Encounter: Payer: Self-pay | Admitting: Gastroenterology

## 2017-11-08 ENCOUNTER — Ambulatory Visit (INDEPENDENT_AMBULATORY_CARE_PROVIDER_SITE_OTHER): Payer: 59 | Admitting: Gastroenterology

## 2017-11-08 VITALS — BP 124/86 | HR 76 | Ht 64.0 in | Wt 178.1 lb

## 2017-11-08 DIAGNOSIS — K625 Hemorrhage of anus and rectum: Secondary | ICD-10-CM

## 2017-11-08 NOTE — Progress Notes (Signed)
Patient canceled colonoscopy that was scheduled for evaluation of rectal bleeding.  She wants to reschedule it in December. Will hold off hemorrhoidal band ligation until after colonoscopy to exclude other etiology for rectal bleeding.  Damaris Hippo , MD [phone]

## 2017-11-08 NOTE — Patient Instructions (Signed)
We will contact you about scheduling your colonoscopy and hemorrhoidal banding when our Decembers schedule is out   Thank you for choosing [facility]  Kavitha Nandigam,MD

## 2017-11-22 ENCOUNTER — Encounter: Payer: 59 | Admitting: Gastroenterology

## 2017-11-24 ENCOUNTER — Encounter: Payer: 59 | Admitting: Gastroenterology

## 2017-11-26 MED FILL — LOSARTAN POTASSIUM 25 MG TA: 25 | 90 days supply | Qty: 90 | Fill #1

## 2017-12-01 DIAGNOSIS — L738 Other specified follicular disorders: Secondary | ICD-10-CM | POA: Diagnosis not present

## 2017-12-01 DIAGNOSIS — Z23 Encounter for immunization: Secondary | ICD-10-CM | POA: Diagnosis not present

## 2017-12-01 DIAGNOSIS — I781 Nevus, non-neoplastic: Secondary | ICD-10-CM | POA: Diagnosis not present

## 2017-12-02 ENCOUNTER — Telehealth: Payer: Self-pay | Admitting: *Deleted

## 2017-12-02 NOTE — Telephone Encounter (Signed)
===  View-only below this line===  ----- Message ----- From: Oda Kilts, CMA Sent: 11/26/2017 To: Oda Kilts, CMA Subject: reminder for colon and banding in dec          Need to get this pt scheduled for colon in December and for banding in December

## 2017-12-03 NOTE — Telephone Encounter (Signed)
12/23 for 1st banding

## 2017-12-03 NOTE — Telephone Encounter (Signed)
Pt is scheduled for colon on 01/13/18 with Dr. Silverio Decamp.

## 2018-01-06 ENCOUNTER — Encounter: Payer: Self-pay | Admitting: Gastroenterology

## 2018-01-06 ENCOUNTER — Ambulatory Visit (AMBULATORY_SURGERY_CENTER): Payer: Self-pay

## 2018-01-06 VITALS — Ht 64.0 in | Wt 176.0 lb

## 2018-01-06 DIAGNOSIS — K625 Hemorrhage of anus and rectum: Secondary | ICD-10-CM

## 2018-01-06 MED ORDER — NA SULFATE-K SULFATE-MG SULF 17.5-3.13-1.6 GM/177ML PO SOLN: 1.0000 | Freq: Once | ORAL | 0 refills | Status: AC

## 2018-01-06 NOTE — Progress Notes (Signed)
Denies allergies to eggs or soy products. Denies complication of anesthesia or sedation. Denies use of weight loss medication. Denies use of O2.   Emmi instructions declined.  

## 2018-01-11 ENCOUNTER — Telehealth: Payer: Self-pay | Admitting: Gastroenterology

## 2018-01-11 NOTE — Telephone Encounter (Signed)
Pt has a concern about propofol- pt states she is not comfortable with propofol - her BP drops fast-  She states her husband has severe HA post propofol- we discussed F/V verses propofol- pt adamant she gets F/V- Will make note and make J Nulty Aware for procedure Friday 01-21-18.   Lelan Pons PV

## 2018-01-11 NOTE — Telephone Encounter (Signed)
Patient wants to speak to nurse regarding sedation during proc. States that she does not feel comfortable with the one we utilize,.

## 2018-01-12 ENCOUNTER — Telehealth: Payer: Self-pay | Admitting: *Deleted

## 2018-01-12 NOTE — Telephone Encounter (Signed)
===  View-only below this line=== ----- Message ----- From: Mauri Pole, MD Sent: 01/11/2018   5:06 PM EST To: Oda Kilts, CMA  Yes, please reschedule the hemorrhoidal banding. Thanks ----- Message ----- From: Oda Kilts, CMA Sent: 01/11/2018   4:15 PM EST To: Mauri Pole, MD  This patient is scheduled for a colon on 12/27 but her banding is the 23rd... Do you want her banding cancelled until after her colonoscopy   It looks like she had cancelled her colon for a later date is why its scheduled this way     Called patient and informed we have cancelled banding appt until after she has colonoscopy

## 2018-01-12 NOTE — Telephone Encounter (Signed)
Marie,  I have noted the pt's wishes.  Thank you  Osvaldo Angst

## 2018-01-13 ENCOUNTER — Encounter: Payer: 59 | Admitting: Gastroenterology

## 2018-01-17 ENCOUNTER — Encounter: Payer: 59 | Admitting: Gastroenterology

## 2018-01-21 ENCOUNTER — Ambulatory Visit (AMBULATORY_SURGERY_CENTER): Payer: 59 | Admitting: Gastroenterology

## 2018-01-21 ENCOUNTER — Encounter: Payer: Self-pay | Admitting: Gastroenterology

## 2018-01-21 VITALS — BP 174/87 | HR 90 | Temp 98.0°F | Resp 16 | Ht 64.0 in | Wt 176.0 lb

## 2018-01-21 DIAGNOSIS — I10 Essential (primary) hypertension: Secondary | ICD-10-CM | POA: Diagnosis not present

## 2018-01-21 DIAGNOSIS — K649 Unspecified hemorrhoids: Secondary | ICD-10-CM

## 2018-01-21 DIAGNOSIS — F419 Anxiety disorder, unspecified: Secondary | ICD-10-CM | POA: Diagnosis not present

## 2018-01-21 DIAGNOSIS — K5730 Diverticulosis of large intestine without perforation or abscess without bleeding: Secondary | ICD-10-CM

## 2018-01-21 DIAGNOSIS — K625 Hemorrhage of anus and rectum: Secondary | ICD-10-CM

## 2018-01-21 DIAGNOSIS — Z8719 Personal history of other diseases of the digestive system: Secondary | ICD-10-CM

## 2018-01-21 MED ORDER — SODIUM CHLORIDE 0.9 % IV SOLN: 500.0000 mL | Freq: Once | INTRAVENOUS | Status: AC

## 2018-01-21 NOTE — Progress Notes (Signed)
Pt's states no medical or surgical changes since previsit or office visit. 

## 2018-01-21 NOTE — Patient Instructions (Signed)
YOU HAD AN ENDOSCOPIC PROCEDURE TODAY AT [facility]:   Refer to the procedure report that was given to you for any specific questions about what was found during the examination.  If the procedure report does not answer your questions, please call your gastroenterologist to clarify.  If you requested that your care partner not be given the details of your procedure findings, then the procedure report has been included in a sealed envelope for you to review at your convenience later.  YOU SHOULD EXPECT: Some feelings of bloating in the abdomen. Passage of more gas than usual.  Walking can help get rid of the air that was put into your GI tract during the procedure and reduce the bloating. If you had a lower endoscopy (such as a colonoscopy or flexible sigmoidoscopy) you may notice spotting of blood in your stool or on the toilet paper. If you underwent a bowel prep for your procedure, you may not have a normal bowel movement for a few days.  Please Note:  You might notice some irritation and congestion in your nose or some drainage.  This is from the oxygen used during your procedure.  There is no need for concern and it should clear up in a day or so.  SYMPTOMS TO REPORT IMMEDIATELY:   Following lower endoscopy (colonoscopy or flexible sigmoidoscopy):  Excessive amounts of blood in the stool  Significant tenderness or worsening of abdominal pains  Swelling of the abdomen that is new, acute  Fever of 100F or higher  For urgent or emergent issues, a gastroenterologist can be reached at any hour by calling [phone].   DIET:  We do recommend a small meal at first, but then you may proceed to your regular diet.  Drink plenty of fluids but you should avoid alcoholic beverages for 24 hours.  ACTIVITY:  You should plan to take it easy for the rest of today and you should NOT DRIVE or use heavy machinery until tomorrow (because of the sedation medicines used during the test).     FOLLOW UP: Our staff will call the number listed on your records the next business day following your procedure to check on you and address any questions or concerns that you may have regarding the information given to you following your procedure. If we do not reach you, we will leave a message.  However, if you are feeling well and you are not experiencing any problems, there is no need to return our call.  We will assume that you have returned to your regular daily activities without incident.  If any biopsies were taken you will be contacted by phone or by letter within the next 1-3 weeks.  Please call us at [phone] if you have not heard about the biopsies in 3 weeks.   Repeat next screening Colonoscopy in 10 years Diverticulosis (handout given) Hemorrhoids (handout given)  SIGNATURES/CONFIDENTIALITY: You and/or your care partner have signed paperwork which will be entered into your electronic medical record.  These signatures attest to the fact that that the information above on your After Visit Summary has been reviewed and is understood.  Full responsibility of the confidentiality of this discharge information lies with you and/or your care-partner.

## 2018-01-21 NOTE — Progress Notes (Signed)
Report given to PACU, vss 

## 2018-01-21 NOTE — Op Note (Signed)
[facility] Patient Name: Emily Barker Procedure Date: 01/21/2018 2:43 PM MRN: [id number] Endoscopist: Mauri Pole , MD Age: 42 Referring MD:  Date of Birth: 07-27-1975 Gender: Female Account #: [id number] Procedure:                Colonoscopy Indications:              Evaluation of unexplained GI bleeding, Follow-up of                            diverticulitis Medicines:                None Procedure:                Pre-Anesthesia Assessment:                           - Prior to the procedure, a History and Physical                            was performed, and patient medications and                            allergies were reviewed. The patient's tolerance of                            previous anesthesia was also reviewed. The risks                            and benefits of the procedure and the sedation                            options and risks were discussed with the patient.                            All questions were answered, and informed consent                            was obtained. Prior Anticoagulants: The patient has                            taken no previous anticoagulant or antiplatelet                            agents. ASA Grade Assessment: II - A patient with                            mild systemic disease. After reviewing the risks                            and benefits, the patient was deemed in                            satisfactory condition to undergo the procedure.  After obtaining informed consent, the colonoscope                            was passed under direct vision. Throughout the                            procedure, the patient's blood pressure, pulse, and                            oxygen saturations were monitored continuously. The                            Model PCF-[id number]) scope was introduced                            through the anus and advanced to the the terminal                        ileum, with identification of the appendiceal                            orifice and IC valve. The colonoscopy was performed                            without difficulty. The patient tolerated the                            procedure well. Scope In: 2:47:22 PM Scope Out: 3:03:21 PM Scope Withdrawal Time: 0 hours 10 minutes 45 seconds  Total Procedure Duration: 0 hours 15 minutes 59 seconds  Findings:                 The perianal and digital rectal examinations were                            normal.                           Scattered small and large-mouthed diverticula were                            found in the sigmoid colon, descending colon and                            transverse colon.                           Non-bleeding internal hemorrhoids were found during                            retroflexion. The hemorrhoids were medium-sized. Complications:            No immediate complications. Impression:               - Moderate diverticulosis in the sigmoid colon, in  the descending colon and in the transverse colon.                           - Non-bleeding internal hemorrhoids.                           - No specimens collected. Recommendation:           - Patient has a contact number available for                            emergencies. The signs and symptoms of potential                            delayed complications were discussed with the                            patient. Return to normal activities tomorrow.                            Written discharge instructions were provided to the                            patient.                           - Resume previous diet.                           - Continue present medications.                           - Repeat colonoscopy in 10 years for screening                            purposes. Mauri Pole, MD 01/21/2018 3:10:24 PM This report has been signed  electronically.

## 2018-01-24 ENCOUNTER — Telehealth: Payer: Self-pay | Admitting: *Deleted

## 2018-01-24 NOTE — Telephone Encounter (Signed)
  Follow up Call-  Call back number 01/21/2018  Post procedure Call Back phone  # [phone]  Permission to leave phone message Yes  Some recent data might be hidden     Patient questions:   Message left to call us if necessary.  Second call.

## 2018-01-24 NOTE — Telephone Encounter (Signed)
  Follow up Call-  Call back number 01/21/2018  Post procedure Call Back phone  # [phone]  Permission to leave phone message Yes  Some recent data might be hidden     Patient questions:  Message left to call us if necessary.

## 2018-01-28 ENCOUNTER — Ambulatory Visit: Payer: 59 | Admitting: Nurse Practitioner

## 2018-01-28 DIAGNOSIS — Z0289 Encounter for other administrative examinations: Secondary | ICD-10-CM

## 2018-02-11 MED FILL — NATURE-THROID 48.75 MG TAB: 48.75 | 90 days supply | Qty: 90 | Fill #1

## 2018-02-11 MED FILL — NATURE-THROID 32.5 MG TAB: 32.5 | 90 days supply | Qty: 90 | Fill #1

## 2018-02-15 ENCOUNTER — Encounter: Payer: Self-pay | Admitting: Nurse Practitioner

## 2018-02-23 MED FILL — LOSARTAN POTASSIUM 25 MG TA: 25 | 90 days supply | Qty: 90 | Fill #1

## 2018-05-05 MED FILL — LOSARTAN POTASSIUM 25 MG TA: 25 | 30 days supply | Qty: 30 | Fill #0

## 2018-05-31 ENCOUNTER — Telehealth: Payer: Self-pay | Admitting: Nurse Practitioner

## 2018-05-31 NOTE — Telephone Encounter (Signed)
Called and talked with patient. Calling to schedule virtual visit with Baldo Ash. She states that she does not need a follow up right now. She will call back to schedule.

## 2018-06-22 MED FILL — LOSARTAN POTASSIUM 25 MG TA: 25 | 30 days supply | Qty: 30 | Fill #1

## 2018-07-04 ENCOUNTER — Other Ambulatory Visit: Payer: Self-pay | Admitting: Obstetrics and Gynecology

## 2018-07-04 DIAGNOSIS — Z1231 Encounter for screening mammogram for malignant neoplasm of breast: Secondary | ICD-10-CM

## 2018-07-20 ENCOUNTER — Ambulatory Visit: Payer: 59 | Admitting: Obstetrics and Gynecology

## 2018-07-22 MED FILL — LOSARTAN POTASSIUM 25 MG TA: 25 | 30 days supply | Qty: 30 | Fill #2

## 2018-07-26 ENCOUNTER — Telehealth: Payer: Self-pay | Admitting: Nurse Practitioner

## 2018-07-26 NOTE — Telephone Encounter (Signed)
Questions for Screening COVID-19  Symptom onset: n/a  Travel or Contacts: no  During this illness, did/does the patient experience any of the following symptoms? Fever >100.55F []   Yes [x]   No []   Unknown Subjective fever (felt feverish) []   Yes [x]   No []   Unknown Chills []   Yes [x]   No []   Unknown Muscle aches (myalgia) []   Yes [x]   No []   Unknown Runny nose (rhinorrhea) []   Yes [x]   No []   Unknown Sore throat []   Yes [x]   No []   Unknown Cough (new onset or worsening of chronic cough) []   Yes [x]   No []   Unknown Shortness of breath (dyspnea) []   Yes [x]   No []   Unknown Nausea or vomiting []   Yes [x]   No []   Unknown Headache []   Yes [x]   No []   Unknown Abdominal pain  []   Yes [x]   No []   Unknown Diarrhea (?3 loose/looser than normal stools/24hr period) []   Yes [x]   No []   Unknown Other, specify:  Patient risk factors: Smoker? []   Current []   Former []   Never If female, currently pregnant? []   Yes []   No  Patient Active Problem List   Diagnosis Date Noted  . Cavernous hemangioma of brain ([place]) 05/06/2015  . Migraine headache 05/06/2015  . Hot flashes 03/13/2014  . Elevated BP 03/13/2014  . Migraines 11/12/2013  . Eczema 11/12/2013  . IBS (irritable bowel syndrome) 11/12/2013  . 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 Hypothyroidism 10/09/2011  . Thyroid nodule 10/02/2011  . Anxiety 10/02/2011    Plan:  []   High risk for COVID-19 with red flags go to ED (with CP, SOB, weak/lightheaded, or fever > 101.5). Call ahead.  []   High risk for COVID-19 but stable. Inform provider and coordinate time for [facility] visit.   []   No red flags but URI signs or symptoms okay for [facility] visit.

## 2018-07-27 ENCOUNTER — Other Ambulatory Visit: Payer: Self-pay

## 2018-07-27 ENCOUNTER — Ambulatory Visit: Payer: 59 | Admitting: Nurse Practitioner

## 2018-07-27 ENCOUNTER — Encounter: Payer: Self-pay | Admitting: Nurse Practitioner

## 2018-07-27 VITALS — BP 138/80 | HR 85 | Temp 98.3°F | Ht 64.0 in | Wt 181.2 lb

## 2018-07-27 DIAGNOSIS — E039 Hypothyroidism, unspecified: Secondary | ICD-10-CM | POA: Diagnosis not present

## 2018-07-27 DIAGNOSIS — E041 Nontoxic single thyroid nodule: Secondary | ICD-10-CM | POA: Diagnosis not present

## 2018-07-27 DIAGNOSIS — I10 Essential (primary) hypertension: Secondary | ICD-10-CM | POA: Diagnosis not present

## 2018-07-27 DIAGNOSIS — D1802 Hemangioma of intracranial structures: Secondary | ICD-10-CM

## 2018-07-27 MED ORDER — LOSARTAN POTASSIUM 25 MG PO TABS: 25.0000 mg | ORAL_TABLET | Freq: Every day | ORAL | 3 refills | Status: DC

## 2018-07-27 NOTE — Assessment & Plan Note (Signed)
Left side

## 2018-07-27 NOTE — Progress Notes (Signed)
Subjective:  Patient ID: Emily Barker, female    DOB: 12/21/1975  Age: 43 y.o. MRN: [id number]  CC: Follow-up (follow up--not fasting--request ferral to Dr. Jaynee Eagles at [facility]. )  HPI  HTN: stable with losartan 25mg  BP Readings from Last 3 Encounters:  07/27/18 138/80  01/21/18 (!) 174/87  11/08/17 124/86   Hypothyroidism secondary to Hashimoto: Persistent left thyroid nodule. Current use of nature thyroid 90mg .  She is also requesting a referral to neurology to re establish care. She has hx of carvenous hemangioma, diagnosed 2013.  Reviewed past Medical, Social and Family history today.  Outpatient Medications Prior to Visit  Medication Sig Dispense Refill  . cetirizine (ZYRTEC) 10 MG tablet Take 10 mg by mouth as needed for allergies.    . fluticasone (FLONASE) 50 MCG/ACT nasal spray Place into both nostrils daily as needed.     . loratadine (CLARITIN) 5 MG chewable tablet Chew 5 mg by mouth as needed.     . Multiple Vitamin (MULTIVITAMIN) tablet Take 1 tablet by mouth every other day.     . vitamin C (ASCORBIC ACID) 500 MG tablet Take 500 mg by mouth daily.    Marland Kitchen losartan (COZAAR) 25 MG tablet Take 1 tablet (25 mg total) by mouth daily. 90 tablet 1  . NATURE-THROID 32.5 MG tablet     . NATURE-THROID 48.75 MG TABS     . OVER THE COUNTER MEDICATION Take by mouth daily. Nature Thyroid 32.5mg  daily Nature Thyroid 48.75 mg qod     Facility-Administered Medications Prior to Visit  Medication Dose Route Frequency Provider Last Rate Last Dose  . 0.9 %  sodium chloride infusion  500 mL Intravenous Once Nandigam, Venia Minks, MD       Review of Systems  Constitutional: Negative.   Respiratory: Negative.   Cardiovascular: Negative.   Neurological: Negative.   Psychiatric/Behavioral: Negative.    Objective:  BP 138/80   Pulse 85   Temp 98.3 F (36.8 C) (Oral)   Ht 5\' 4"  (1.626 m)   Wt 181 lb 3.2 oz (82.2 kg)   SpO2 99%   BMI 31.10 kg/m   BP Readings from Last  3 Encounters:  07/27/18 138/80  01/21/18 (!) 174/87  11/08/17 124/86    Wt Readings from Last 3 Encounters:  07/27/18 181 lb 3.2 oz (82.2 kg)  01/21/18 176 lb (79.8 kg)  01/06/18 176 lb (79.8 kg)   Physical Exam Vitals signs reviewed.  Neck:     Musculoskeletal: Full passive range of motion without pain, normal range of motion and neck supple.     Thyroid: Thyroid mass present. No thyromegaly or thyroid tenderness.   Cardiovascular:     Rate and Rhythm: Normal rate and regular rhythm.     Pulses: Normal pulses.     Heart sounds: Normal heart sounds.  Pulmonary:     Effort: Pulmonary effort is normal.     Breath sounds: Normal breath sounds.  Musculoskeletal:     Right lower leg: No edema.     Left lower leg: No edema.  Lymphadenopathy:     Cervical: No cervical adenopathy.  Neurological:     Mental Status: She is alert and oriented to person, place, and time.  Psychiatric:        Mood and Affect: Mood normal.        Behavior: Behavior normal.        Thought Content: Thought content normal.     Lab Results  Component Value Date  WBC 7.9 01/30/2015   HGB 12.6 01/30/2015   HCT 39.2 01/30/2015   PLT 244 01/30/2015   GLUCOSE 100 (H) 07/27/2018   CHOL 197 01/30/2015   TRIG 120 01/30/2015   HDL 59 01/30/2015   LDLCALC 114 01/30/2015   ALT 11 01/30/2015   AST 14 01/30/2015   NA 140 07/27/2018   K 4.0 07/27/2018   CL 104 07/27/2018   CREATININE 0.73 07/27/2018   BUN 8 07/27/2018   CO2 26 07/27/2018   TSH 3.46 07/27/2018    Assessment & Plan:   Emily Barker was seen today for follow-up.  Diagnoses and all orders for this visit:  Hypothyroidism, unspecified type -     TSH -     T4, free -     T3, free -     NATURE-THROID 48.75 MG TABS; Take 1 tablet by mouth daily. In combination with 32.5mg  -     NATURE-THROID 32.5 MG tablet; Take 1 tablet (32.5 mg total) by mouth daily. In combination with 48.75mg   Cavernous hemangioma of brain ([place]) -     Ambulatory  referral to Neurology  Thyroid nodule -     TSH -     T4, free -     T3, free -     NATURE-THROID 48.75 MG TABS; Take 1 tablet by mouth daily. In combination with 32.5mg  -     NATURE-THROID 32.5 MG tablet; Take 1 tablet (32.5 mg total) by mouth daily. In combination with 48.75mg   Essential hypertension -     Basic metabolic panel -     losartan (COZAAR) 25 MG tablet; Take 1 tablet (25 mg total) by mouth daily.   I have changed Emily Barker's Nature-Throid and Nature-Throid. I am also having her maintain her loratadine, fluticasone, multivitamin, vitamin C, OVER THE COUNTER MEDICATION, cetirizine, and losartan. We will continue to administer sodium chloride.  Meds ordered this encounter  Medications  . losartan (COZAAR) 25 MG tablet    Sig: Take 1 tablet (25 mg total) by mouth daily.    Dispense:  90 tablet    Refill:  3    Order Specific Question:   Supervising Provider    Answer:   Lucille Passy [[id number]]  . NATURE-THROID 48.75 MG TABS    Sig: Take 1 tablet by mouth daily. In combination with 32.5mg     Dispense:  90 tablet    Refill:  1    Order Specific Question:   Supervising Provider    Answer:   Lucille Passy [[id number]]  . NATURE-THROID 32.5 MG tablet    Sig: Take 1 tablet (32.5 mg total) by mouth daily. In combination with 48.75mg     Dispense:  90 tablet    Refill:  1    Order Specific Question:   Supervising Provider    Answer:   Lucille Passy [[id number]]    Problem List Items Addressed This Visit      Cardiovascular and Mediastinum   Cavernous hemangioma of brain [facility])    Managed by Dr. Jaynee Eagles. Last MRI Brain 2013:  10 mm lesion in the left frontal lobe with surrounding susceptibility compatible with hemosiderin ring.  This lesion is compatible with a cavernous hemangioma.  There is no evidence for neoplasm or a more ominous vascular lesion.  There may have been a recent repeat hemorrhage of the lesion. 2.  No additional lesions are present. 3.  Minimal sinus disease.   Last Ct head 2016: unchanged  Relevant Medications   losartan (COZAAR) 25 MG tablet   Other Relevant Orders   Ambulatory referral to Neurology     Endocrine   Hypothyroidism - Primary   Relevant Medications   NATURE-THROID 48.75 MG TABS   NATURE-THROID 32.5 MG tablet   Other Relevant Orders   TSH (Completed)   T4, free (Completed)   T3, free (Completed)   Thyroid nodule    Left side      Relevant Medications   NATURE-THROID 48.75 MG TABS   NATURE-THROID 32.5 MG tablet   Other Relevant Orders   TSH (Completed)   T4, free (Completed)   T3, free (Completed)    Other Visit Diagnoses    Essential hypertension       Relevant Medications   losartan (COZAAR) 25 MG tablet   Other Relevant Orders   Basic metabolic panel (Completed)      Follow-up: Return in about 4 weeks (around 08/24/2018) for CPE (fasting).  Wilfred Lacy, NP

## 2018-07-27 NOTE — Assessment & Plan Note (Addendum)
Managed by Dr. Jaynee Eagles. Last MRI Brain 2013:  10 mm lesion in the left frontal lobe with surrounding susceptibility compatible with hemosiderin ring.  This lesion is compatible with a cavernous hemangioma.  There is no evidence for neoplasm or a more ominous vascular lesion.  There may have been a recent repeat hemorrhage of the lesion. 2.  No additional lesions are present. 3.  Minimal sinus disease.  Last Ct head 2016: unchanged

## 2018-07-27 NOTE — Patient Instructions (Addendum)
Go to lab for blood draw.  Entered referral to neurology: Dr. Jaynee Eagles  Maintain current medications

## 2018-07-28 LAB — BASIC METABOLIC PANEL
BUN: 8 mg/dL (ref 7–25)
CO2: 26 mmol/L (ref 20–32)
Calcium: 9.4 mg/dL (ref 8.6–10.2)
Chloride: 104 mmol/L (ref 98–110)
Creat: 0.73 mg/dL (ref 0.50–1.10)
Glucose, Bld: 100 mg/dL — ABNORMAL HIGH (ref 65–99)
Potassium: 4 mmol/L (ref 3.5–5.3)
Sodium: 140 mmol/L (ref 135–146)

## 2018-07-28 LAB — T4, FREE: Free T4: 0.9 ng/dL (ref 0.8–1.8)

## 2018-07-28 LAB — TSH: TSH: 3.46 mIU/L

## 2018-07-28 LAB — T3, FREE: T3, Free: 2.9 pg/mL (ref 2.3–4.2)

## 2018-07-28 MED ORDER — NATURE-THROID 48.75 MG PO TABS: 1.0000 | ORAL_TABLET | Freq: Every day | ORAL | 1 refills | Status: DC

## 2018-07-28 MED ORDER — NATURE-THROID 32.5 MG PO TABS: 32.5000 mg | ORAL_TABLET | Freq: Every day | ORAL | 1 refills | Status: DC

## 2018-07-28 MED FILL — NATURE-THROID 32.5 MG TAB: 32.5 | 90 days supply | Qty: 90 | Fill #0

## 2018-07-28 MED FILL — NATURE-THROID 48.75 MG TAB: 48.75 | 90 days supply | Qty: 90 | Fill #0

## 2018-08-16 MED FILL — LOSARTAN POTASSIUM 25 MG TA: 25 | 30 days supply | Qty: 30 | Fill #0

## 2018-08-18 ENCOUNTER — Ambulatory Visit
Admission: RE | Admit: 2018-08-18 | Discharge: 2018-08-18 | Disposition: A | Discharge status: Home / Self Care | Payer: 59 | Source: Ambulatory Visit | Attending: Obstetrics and Gynecology | Admitting: Obstetrics and Gynecology

## 2018-08-18 ENCOUNTER — Other Ambulatory Visit: Payer: Self-pay

## 2018-08-18 DIAGNOSIS — Z1231 Encounter for screening mammogram for malignant neoplasm of breast: Secondary | ICD-10-CM | POA: Diagnosis not present

## 2018-08-24 ENCOUNTER — Ambulatory Visit: Payer: 59 | Admitting: Nurse Practitioner

## 2018-09-06 ENCOUNTER — Other Ambulatory Visit: Payer: Self-pay

## 2018-09-06 ENCOUNTER — Ambulatory Visit (INDEPENDENT_AMBULATORY_CARE_PROVIDER_SITE_OTHER): Payer: 59 | Admitting: Nurse Practitioner

## 2018-09-06 ENCOUNTER — Encounter: Payer: Self-pay | Admitting: Nurse Practitioner

## 2018-09-06 VITALS — BP 126/86 | HR 115 | Temp 97.4°F | Ht 64.0 in | Wt 179.2 lb

## 2018-09-06 DIAGNOSIS — I10 Essential (primary) hypertension: Secondary | ICD-10-CM

## 2018-09-06 DIAGNOSIS — Z136 Encounter for screening for cardiovascular disorders: Secondary | ICD-10-CM

## 2018-09-06 DIAGNOSIS — E039 Hypothyroidism, unspecified: Secondary | ICD-10-CM | POA: Diagnosis not present

## 2018-09-06 DIAGNOSIS — Z0000 Encounter for general adult medical examination without abnormal findings: Secondary | ICD-10-CM

## 2018-09-06 DIAGNOSIS — G43009 Migraine without aura, not intractable, without status migrainosus: Secondary | ICD-10-CM

## 2018-09-06 DIAGNOSIS — Z13220 Encounter for screening for lipoid disorders: Secondary | ICD-10-CM | POA: Diagnosis not present

## 2018-09-06 DIAGNOSIS — R7301 Impaired fasting glucose: Secondary | ICD-10-CM

## 2018-09-06 LAB — COMPREHENSIVE METABOLIC PANEL
ALT: 15 U/L (ref 0–35)
AST: 17 U/L (ref 0–37)
Albumin: 4.4 g/dL (ref 3.5–5.2)
Alkaline Phosphatase: 67 U/L (ref 39–117)
BUN: 10 mg/dL (ref 6–23)
CO2: 29 mEq/L (ref 19–32)
Calcium: 9.9 mg/dL (ref 8.4–10.5)
Chloride: 104 mEq/L (ref 96–112)
Creatinine, Ser: 0.75 mg/dL (ref 0.40–1.20)
GFR: 83.52 mL/min (ref >60.00)
Glucose, Bld: 93 mg/dL (ref 70–99)
Potassium: 4.5 mEq/L (ref 3.5–5.1)
Sodium: 139 mEq/L (ref 135–145)
Total Bilirubin: 0.5 mg/dL (ref 0.2–1.2)
Total Protein: 7.3 g/dL (ref 6.0–8.3)

## 2018-09-06 LAB — CBC
HCT: 37.3 % (ref 36.0–46.0)
Hemoglobin: 12.2 g/dL (ref 12.0–15.0)
MCHC: 32.6 g/dL (ref 30.0–36.0)
MCV: 81.9 fl (ref 78.0–100.0)
Platelets: 241 10*3/uL (ref 150.0–400.0)
RBC: 4.55 Mil/uL (ref 3.87–5.11)
RDW: 14.9 % (ref 11.5–15.5)
WBC: 5.2 10*3/uL (ref 4.0–10.5)

## 2018-09-06 LAB — LIPID PANEL
Cholesterol: 210 mg/dL — ABNORMAL HIGH (ref 0–200)
HDL: 57.6 mg/dL (ref >39.00)
LDL Cholesterol: 132 mg/dL — ABNORMAL HIGH (ref 0–99)
NonHDL: 152.7
Total CHOL/HDL Ratio: 4
Triglycerides: 106 mg/dL (ref 0.0–149.0)
VLDL: 21.2 mg/dL (ref 0.0–40.0)

## 2018-09-06 LAB — HEMOGLOBIN A1C: Hgb A1c MFr Bld: 5.6 % (ref 4.6–6.5)

## 2018-09-06 NOTE — Progress Notes (Signed)
Subjective:    Patient ID: Emily Barker, female    DOB: Jan 05, 1976, 43 y.o.   MRN: [id number]  Patient presents today for complete physical   HPI  Denies any acute complain.  Sexual History (orientation,birth control, marital status, STD):married sexually active, LMP 09/02/2018, up to date with PAP and mammogram (done by GYN)  Depression/Suicide: Depression screen [facility] 2/9 07/27/2018 07/28/2017 01/30/2015 07/18/2014  Decreased Interest 0 0 0 0  Down, Depressed, Hopeless 0 0 0 0  PHQ - 2 Score 0 0 0 0   Vision:up to date  Dental:up to date  Immunizations: (TDAP, Hep C screen, Pneumovax, Influenza, zoster)  Health Maintenance  Topic Date Due  . Flu Shot  08/27/2018  . HIV Screening  09/06/2019*  . Tetanus Vaccine  01/27/2019  . Pap Smear  07/12/2020  *Topic was postponed. The date shown is not the original due date.   Diet:regular  Weight:  Wt Readings from Last 3 Encounters:  09/06/18 179 lb 3.2 oz (81.3 kg)  07/27/18 181 lb 3.2 oz (82.2 kg)  01/21/18 176 lb (79.8 kg)    Exercise:walking, weight lifing  Fall Risk: Fall Risk  07/27/2018 07/28/2017 01/30/2015  Falls in the past year? 0 No No   Advanced Directive: Advanced Directives 11/26/2014  Does Patient Have a Medical Advance Directive? No  Would patient like information on creating a medical advance directive? -     Medications and allergies reviewed with patient and updated if appropriate.  Patient Active Problem List   Diagnosis Date Noted  . Cavernous hemangioma of brain ([place]) 05/06/2015  . Migraine headache 05/06/2015  . Hot flashes 03/13/2014  . Elevated BP 03/13/2014  . Migraines 11/12/2013  . Eczema 11/12/2013  . IBS (irritable bowel syndrome) 11/12/2013  . Diplopia 03/02/2012  . Hypertropia 03/02/2012  . Hypothyroid 10/09/2011  . Thyroid nodule 10/02/2011  . Anxiety 10/02/2011    Current Outpatient Medications on File Prior to Visit  Medication Sig Dispense Refill  . cetirizine (ZYRTEC) 10 MG  tablet Take 10 mg by mouth as needed for allergies.    . fluticasone (FLONASE) 50 MCG/ACT nasal spray Place into both nostrils daily as needed.     . loratadine (CLARITIN) 5 MG chewable tablet Chew 5 mg by mouth as needed.     Marland Kitchen losartan (COZAAR) 25 MG tablet Take 1 tablet (25 mg total) by mouth daily. 90 tablet 3  . Multiple Vitamin (MULTIVITAMIN) tablet Take 1 tablet by mouth every other day.     Marland Kitchen NATURE-THROID 32.5 MG tablet Take 1 tablet (32.5 mg total) by mouth daily. In combination with 48.75mg  90 tablet 1  . NATURE-THROID 48.75 MG TABS Take 1 tablet by mouth daily. In combination with 32.5mg  90 tablet 1  . vitamin C (ASCORBIC ACID) 500 MG tablet Take 500 mg by mouth daily.    Marland Kitchen OVER THE COUNTER MEDICATION Take by mouth daily. Nature Thyroid 32.5mg  daily Nature Thyroid 48.75 mg qod     Current Facility-Administered Medications on File Prior to Visit  Medication Dose Route Frequency Provider Last Rate Last Dose  . 0.9 %  sodium chloride infusion  500 mL Intravenous Once Mauri Pole, MD        Past Medical History:  Diagnosis Date  . Abnormal Pap smear of cervix 2007   CIN 2 + HRHPV/ LEEP  . Allergy   . Anemia   . Anxiety 2012  . Complication of anesthesia    bp dropped with epidural  .  Contraception    husband vasectomy  . Diverticulosis   . Dysmenorrhea   . GERD (gastroesophageal reflux disease)   . Hashimoto's thyroiditis   . Heart murmur    as a child  . Hemangioma, intracranial structures ([place]) 2013   Left Frontal Lobe  . Hepatitis A   . Hypertension   . Hypothyroid    right thyroid nodule  . Migraines   . Panic disorder 2012  . Sinus tachycardia 2012  . Vertigo     Past Surgical History:  Procedure Laterality Date  . CERVICAL BIOPSY  W/ LOOP ELECTRODE EXCISION  2007   CIN 2  . COLPOSCOPY  2007   CIN 2/ HRHPV  . DILATION AND CURETTAGE OF UTERUS    . SEPTOPLASTY    . TONSILLECTOMY AND ADENOIDECTOMY  1988    Social History   Socioeconomic  History  . Marital status: Married    Spouse name: Lanny Hurst  . Number of children: 3  . Years of education: 47  . Highest education level: Not on file  Occupational History  . Occupation: [employer]  Social Needs  . Financial resource strain: Not on file  . Food insecurity    Worry: Not on file    Inability: Not on file  . Transportation needs    Medical: Not on file    Non-medical: Not on file  Tobacco Use  . Smoking status: Never Smoker  . Smokeless tobacco: Never Used  Substance and Sexual Activity  . Alcohol use: Yes    Alcohol/week: 7.0 - 14.0 standard drinks    Types: 7 - 14 Standard drinks or equivalent per week    Comment:  glass of wine/night  . Drug use: No  . Sexual activity: Yes    Partners: Male    Birth control/protection: Other-see comments    Comment: vasectomy-husband  Lifestyle  . Physical activity    Days per week: Not on file    Minutes per session: Not on file  . Stress: Not on file  Relationships  . Social Herbalist on phone: Not on file    Gets together: Not on file    Attends religious service: Not on file    Active member of club or organization: Not on file    Attends meetings of clubs or organizations: Not on file    Relationship status: Not on file  Other Topics Concern  . Not on file  Social History Narrative   Lives with husband, children   Caffeine use:  1 cup coffee/day   Originally from [place] History  Problem Relation Age of Onset  . Prostate cancer Father 40       Metastatic CA to bones  . Hypertension Father   . Alcohol abuse Father   . Heart disease Father   . Cancer Father        Prostate  . Hyperlipidemia Mother   . Alcohol abuse Paternal Grandfather   . Heart disease Paternal Grandfather   . Dementia Maternal Grandmother   . Heart disease Maternal Grandfather   . Heart disease Paternal Grandmother   . Hypertension Brother   . Breast cancer Maternal Aunt   . Depression Maternal Aunt   . Diabetes  Maternal Aunt   . Heart attack Maternal Uncle   . Heart disease Paternal Aunt   . Depression Maternal Aunt   . Diabetes Maternal Aunt   . Heart attack Paternal Uncle   . Migraines Neg  Hx   . Colon cancer Neg Hx   . Esophageal cancer Neg Hx   . Rectal cancer Neg Hx   . Stomach cancer Neg Hx        Review of Systems  Constitutional: Negative for fever, malaise/fatigue and weight loss.  HENT: Negative for congestion and sore throat.   Eyes:       Negative for visual changes  Respiratory: Negative for cough and shortness of breath.   Cardiovascular: Negative for chest pain, palpitations and leg swelling.  Gastrointestinal: Negative for blood in stool, constipation, diarrhea and heartburn.  Genitourinary: Negative for dysuria, frequency and urgency.  Musculoskeletal: Negative for falls, joint pain and myalgias.  Skin: Negative for rash.  Neurological: Negative for dizziness, sensory change and headaches.  Endo/Heme/Allergies: Does not bruise/bleed easily.  Psychiatric/Behavioral: Negative for depression, substance abuse and suicidal ideas. The patient is not nervous/anxious.     Objective:   Vitals:   09/06/18 0855  BP: 126/86  Pulse: (!) 115  Temp: (!) 97.4 F (36.3 C)  SpO2: 100%    Body mass index is 30.76 kg/m.   Physical Examination:  Physical Exam Vitals signs reviewed.  Constitutional:      General: She is not in acute distress.    Appearance: She is well-developed.  HENT:     Head: Normocephalic.     Right Ear: Tympanic membrane, ear canal and external ear normal.     Left Ear: Tympanic membrane, ear canal and external ear normal.     Nose: Nose normal.     Right Nostril: No occlusion.     Left Nostril: No occlusion.     Right Turbinates: Not enlarged or swollen.     Left Turbinates: Not enlarged or swollen.     Right Sinus: No maxillary sinus tenderness or frontal sinus tenderness.     Left Sinus: No maxillary sinus tenderness or frontal sinus  tenderness.     Mouth/Throat:     Mouth: Mucous membranes are moist.     Pharynx: Oropharynx is clear. Uvula midline. No oropharyngeal exudate or posterior oropharyngeal erythema.     Tonsils: No tonsillar exudate.  Eyes:     Extraocular Movements: Extraocular movements intact.     Conjunctiva/sclera: Conjunctivae normal.     Pupils: Pupils are equal, round, and reactive to light.  Neck:     Musculoskeletal: Normal range of motion and neck supple.  Cardiovascular:     Rate and Rhythm: Normal rate and regular rhythm.     Pulses: Normal pulses.     Heart sounds: Normal heart sounds.  Pulmonary:     Effort: Pulmonary effort is normal. No respiratory distress.     Breath sounds: Normal breath sounds.  Chest:     Chest wall: No tenderness.  Genitourinary:    Comments: Deferred breast and pelvic exam to GYN Musculoskeletal: Normal range of motion.     Right lower leg: No edema.     Left lower leg: No edema.  Lymphadenopathy:     Cervical: No cervical adenopathy.  Skin:    General: Skin is warm and dry.  Neurological:     Mental Status: She is alert and oriented to person, place, and time.     Deep Tendon Reflexes: Reflexes are normal and symmetric.  Psychiatric:        Mood and Affect: Mood normal.        Behavior: Behavior normal.        Thought Content: Thought content normal.  ASSESSMENT and PLAN:  Emily Barker was seen today for annual exam.  Diagnoses and all orders for this visit:  Preventative health care -     Comprehensive metabolic panel -     CBC -     Lipid panel  Hypothyroidism, unspecified type  Essential hypertension  Encounter for lipid screening for cardiovascular disease -     Lipid panel  Impaired fasting glucose -     Hemoglobin A1c  Migraine without aura and without status migrainosus, not intractable    Migraines Right side headache, associated with menstrual cycle, no aura, no dizziness, Has chronic tinnitus (not necessarily associated  to headaches). Managed with use of tylenol 500-1000mg  prn      Problem List Items Addressed This Visit      Cardiovascular and Mediastinum   Migraines    Right side headache, associated with menstrual cycle, no aura, no dizziness, Has chronic tinnitus (not necessarily associated to headaches). Managed with use of tylenol 500-1000mg  prn        Endocrine   Hypothyroid    Other Visit Diagnoses    Preventative health care    -  Primary   Relevant Orders   Comprehensive metabolic panel (Completed)   CBC (Completed)   Lipid panel (Completed)   Essential hypertension       Encounter for lipid screening for cardiovascular disease       Relevant Orders   Lipid panel (Completed)   Impaired fasting glucose       Relevant Orders   Hemoglobin A1c (Completed)       Follow up: Return in about 6 months (around 03/09/2019) for hypothyroidism and HTN.  Wilfred Lacy, NP

## 2018-09-06 NOTE — Assessment & Plan Note (Signed)
Right side headache, associated with menstrual cycle, no aura, no dizziness, Has chronic tinnitus (not necessarily associated to headaches). Managed with use of tylenol 500-1000mg  prn

## 2018-09-06 NOTE — Patient Instructions (Addendum)
Normal cbc, CMP, and HgbA1c Abnormal lipid panel with elevated total cholesterol and LDL. With these numbers, your risk for developing cardiovascular disease over the next 69yrs is 4.6%. This is low so no medication is needed at this time. Continue regular exercise and heart healthy diet.  Health Maintenance, Female Adopting a healthy lifestyle and getting preventive care are important in promoting health and wellness. Ask your health care provider about:  The right schedule for you to have regular tests and exams.  Things you can do on your own to prevent diseases and keep yourself healthy. What should I know about diet, weight, and exercise? Eat a healthy diet   Eat a diet that includes plenty of vegetables, fruits, low-fat dairy products, and lean protein.  Do not eat a lot of foods that are high in solid fats, added sugars, or sodium. Maintain a healthy weight Body mass index (BMI) is used to identify weight problems. It estimates body fat based on height and weight. Your health care provider can help determine your BMI and help you achieve or maintain a healthy weight. Get regular exercise Get regular exercise. This is one of the most important things you can do for your health. Most adults should:  Exercise for at least 150 minutes each week. The exercise should increase your heart rate and make you sweat (moderate-intensity exercise).  Do strengthening exercises at least twice a week. This is in addition to the moderate-intensity exercise.  Spend less time sitting. Even light physical activity can be beneficial. Watch cholesterol and blood lipids Have your blood tested for lipids and cholesterol at 45 years of age, then have this test every 5 years. Have your cholesterol levels checked more often if:  Your lipid or cholesterol levels are high.  You are older than 45 years of age.  You are at high risk for heart disease. What should I know about cancer screening? Depending  on your health history and family history, you may need to have cancer screening at various ages. This may include screening for:  Breast cancer.  Cervical cancer.  Colorectal cancer.  Skin cancer.  Lung cancer. What should I know about heart disease, diabetes, and high blood pressure? Blood pressure and heart disease  High blood pressure causes heart disease and increases the risk of stroke. This is more likely to develop in people who have high blood pressure readings, are of African descent, or are overweight.  Have your blood pressure checked: ? Every 3-5 years if you are 62-44 years of age. ? Every year if you are 30 years old or older. Diabetes Have regular diabetes screenings. This checks your fasting blood sugar level. Have the screening done:  Once every three years after age 61 if you are at a normal weight and have a low risk for diabetes.  More often and at a younger age if you are overweight or have a high risk for diabetes. What should I know about preventing infection? Hepatitis B If you have a higher risk for hepatitis B, you should be screened for this virus. Talk with your health care provider to find out if you are at risk for hepatitis B infection. Hepatitis C Testing is recommended for:  Everyone born from [birth year] through 1967.  Anyone with known risk factors for hepatitis C. Sexually transmitted infections (STIs)  Get screened for STIs, including gonorrhea and chlamydia, if: ? You are sexually active and are younger than 45 years of age. ? You are older than  45 years of age and your health care provider tells you that you are at risk for this type of infection. ? Your sexual activity has changed since you were last screened, and you are at increased risk for chlamydia or gonorrhea. Ask your health care provider if you are at risk.  Ask your health care provider about whether you are at high risk for HIV. Your health care provider may recommend a  prescription medicine to help prevent HIV infection. If you choose to take medicine to prevent HIV, you should first get tested for HIV. You should then be tested every 3 months for as long as you are taking the medicine. Pregnancy  If you are about to stop having your period (premenopausal) and you may become pregnant, seek counseling before you get pregnant.  Take 400 to 800 micrograms (mcg) of folic acid every day if you become pregnant.  Ask for birth control (contraception) if you want to prevent pregnancy. Osteoporosis and menopause Osteoporosis is a disease in which the bones lose minerals and strength with aging. This can result in bone fractures. If you are 69 years old or older, or if you are at risk for osteoporosis and fractures, ask your health care provider if you should:  Be screened for bone loss.  Take a calcium or vitamin D supplement to lower your risk of fractures.  Be given hormone replacement therapy (HRT) to treat symptoms of menopause. Follow these instructions at home: Lifestyle  Do not use any products that contain nicotine or tobacco, such as cigarettes, e-cigarettes, and chewing tobacco. If you need help quitting, ask your health care provider.  Do not use street drugs.  Do not share needles.  Ask your health care provider for help if you need support or information about quitting drugs. Alcohol use  Do not drink alcohol if: ? Your health care provider tells you not to drink. ? You are pregnant, may be pregnant, or are planning to become pregnant.  If you drink alcohol: ? Limit how much you use to 0-1 drink a day. ? Limit intake if you are breastfeeding.  Be aware of how much alcohol is in your drink. In the [place] one drink equals one 12 oz bottle of beer (355 mL), one 5 oz glass of wine (148 mL), or one 1 oz glass of hard liquor (44 mL). General instructions  Schedule regular health, dental, and eye exams.  Stay current with your vaccines.   Tell your health care provider if: ? You often feel depressed. ? You have ever been abused or do not feel safe at home. Summary  Adopting a healthy lifestyle and getting preventive care are important in promoting health and wellness.  Follow your health care provider's instructions about healthy diet, exercising, and getting tested or screened for diseases.  Follow your health care provider's instructions on monitoring your cholesterol and blood pressure. This information is not intended to replace advice given to you by your health care provider. Make sure you discuss any questions you have with your health care provider. Document Released: 07/28/2010 Document Revised: 01/05/2018 Document Reviewed: 01/05/2018 Elsevier Patient Education  2020 [employer].

## 2018-09-07 ENCOUNTER — Encounter: Payer: Self-pay | Admitting: Nurse Practitioner

## 2018-09-07 DIAGNOSIS — I10 Essential (primary) hypertension: Secondary | ICD-10-CM | POA: Insufficient documentation

## 2018-09-14 ENCOUNTER — Ambulatory Visit: Payer: 59 | Admitting: Neurology

## 2018-09-14 ENCOUNTER — Other Ambulatory Visit: Payer: Self-pay

## 2018-09-14 ENCOUNTER — Encounter: Payer: Self-pay | Admitting: Neurology

## 2018-09-14 VITALS — BP 143/92 | HR 78 | Temp 96.8°F | Ht 64.0 in | Wt 179.0 lb

## 2018-09-14 DIAGNOSIS — Q283 Other malformations of cerebral vessels: Secondary | ICD-10-CM | POA: Diagnosis not present

## 2018-09-14 DIAGNOSIS — R519 Headache, unspecified: Secondary | ICD-10-CM

## 2018-09-14 DIAGNOSIS — D1802 Hemangioma of intracranial structures: Secondary | ICD-10-CM | POA: Diagnosis not present

## 2018-09-14 DIAGNOSIS — R51 Headache with orthostatic component, not elsewhere classified: Secondary | ICD-10-CM

## 2018-09-14 DIAGNOSIS — R200 Anesthesia of skin: Secondary | ICD-10-CM

## 2018-09-14 DIAGNOSIS — G43709 Chronic migraine without aura, not intractable, without status migrainosus: Secondary | ICD-10-CM | POA: Diagnosis not present

## 2018-09-14 NOTE — Progress Notes (Signed)
[facility]   Provider: Dr Jaynee Barker Referring Provider: Wilfred Barker Primary Care Physician:Emily Barker  CC: hemangioma  HPI: 43 year old patient here at the request of Emily Barker for cavernous hemangioma.  hereditary cavernous malformation on her father's side, multiple family members on that side have vascular lesions. This was discovered many years ago and has remained stable and asymptomatic. She has past medical history of migraines, vertigo and panic attacks,hypothyroidism graves disease. However she is concerned because She is having new symptoms, hand numbness and worsening headaches. For a few years she is having waking up with hands numb. She has not tried wrist splints. No problems during the day, When she is crocheting her pinky gets numb. Symptoms symmetric in the hands , may not happen at the same time but often together, she reports it is her entire hand all 5 fingers, her elbows hurt. Discussed conservative measures. Her headaches have changed. She is menstruating, not going through menopause. [place] used to menstrual related however now they can happen more often such as a week before her period or a week after. More headaches which is new. Headaches can be in the morning and worse positionally. She takes tylenol. They can last 4 days. The migraine is not following her period Emily Barker the past and more headaches. She has had migraines. She has daily headaches. And 8 migraine days a month for over a year. She has no aura. No medication overuse. Migraines are unilateral, pulsating, pounding, throbbing, light and sound sensitivity, movement makes it worse, ongoing for > 1 year at moderatatly severe to severe migraine severity. She is a Education officer, museum. She is a fan of acupuncture. No other focal neurologic deficits, associated symptoms, inciting events or modifiable factors.    Interval history 07/17/2015:  On April 27th she started getting dizzy. She was doing a  "child's pose" yoga and when she got up she got dizzy and keep happening. Vertiginous. No previous illnesses, no fevers. It doesn;t matter which side. She could be laying down. Sometimes when she gets up it happens. She can't do yoga either or lay on her back. She ha nausea. Never had it before. Not this persistent. No vomiting. Tinnitus is louder. She feels like she is training to focus but not blurry or dbl vision.  Discussion with patient about vertigo, the different causes area it appears as though her vertigo is peripheral. Emily Barker performed in the clinic was positive for the right ear. Showed her how to do Epley maneuvers and discussed the pathophysiology of benign positional peripheral vertigo. Discussed diagnosis and management.  CMP/CMP 09/06/2018 normal  Reviewed MRI of the brain 11/2011 images and agree with the following:  1.  10 mm lesion in the left frontal lobe with surrounding susceptibility compatible with hemosiderin ring.  This lesion is compatible with a cavernous hemangioma.  There is no evidence for neoplasm or a more ominous vascular lesion.  There may have been a recent repeat hemorrhage of the lesion. 2.  No additional lesions are present. 3.  Minimal sinus disease.  CT head 2016 reviewed images and agree: Unchanged posterior left frontal cavernoma. No acute intracranial findings. Otherwise normal brain.   HPI: Emily Barker is a 43 y.o. female here as a referral from Emily Barker for hereditary cavernous malformation on her father's side, multiple family members on that side have vascular lesions. This was discovered many years ago and has remained stable and asymptomatic. She has past medical history of migraines, vertigo  and panic attacks,hypothyroidism graves disease. She gets the migraines on the right side. She has a daily pain in the left frontal area of the head. Very localized pain in the area that improves to hair pulling and pressure. She has spoke to  neurosurgeons about removal of her hemangioma and does not want to pursue that at this time. She is here to establish care with a neurologist. She has aching muscles. She snores but she sleeps on her side and not excessively tired during the day, no apneic events. She has a history of CTS but those symptoms have improved. She has monthly migraines that she feels are well controlled. No new symptoms, no other focal neurologic deficits, no vision changes, no dysarthria or aphasia, no weakness, no sensory changes of her limbs.  Reviewed notes, labs and imaging from outside physicians, which showed:  CT of the head 11/15/2014: Personally reviewed images and agree with the following The posterior left frontal cavernoma is again evident and is unchanged. There is no intracranial hemorrhage. There is no extra-axial fluid collection. There is no evidence of acute infarction. Gray matter and white matter are otherwise unremarkable in appearance. Brain volume is normal for age. No bony abnormality is evident.  IMPRESSION: Unchanged posterior left frontal cavernoma. No acute intracranial findings. Otherwise normal brain.   MRI of the brain in November 2013: Personally reviewed and agree with the following. Also reviewed images with the patient. Comparison: CT head without contrast 12/21/2011.  Findings: A 9 x 10 x 9 mm T2 hyperintense lesion is evident within the left frontal lobe. A well-defined T2 hypointense rim surrounds the lesion with susceptibility. Slight enhancement of the lesion is consistent with vascularity. The lesion is isointense to white matter on T1-weighted images. No additional lesions are present. Significant susceptibility artifact is associated on the diffusion sequence.  No acute infarct is present. No other hemorrhage or mass is evident. No significant white matter disease is present.  Flow is present in the major intracranial arteries. The globes and orbits are intact.  Mild mucosal thickening is present maxillary sinuses and ethmoid air cells bilaterally. The mastoid air cells are clear.  IMPRESSION:  1. 10 mm lesion in the left frontal lobe with surrounding susceptibility compatible with hemosiderin ring. This lesion is compatible with a cavernous hemangioma. There is no evidence for neoplasm or a more ominous vascular lesion. There may have been a recent repeat hemorrhage of the lesion. 2. No additional lesions are present. 3. Minimal sinus disease.  CBC unremarkable, TSH 3.38 within normal limits, CMP with creatinine of 0.71. Labs from January 2017.  Review of Systems: Patient complains of symptoms per HPI as well as the following symptoms: No chest pain, no shortness of breath. Headaches. Pertinent negatives per HPI. All others negative.     Social History   Socioeconomic History  . Marital status: Married    Spouse name: Lanny Hurst  . Number of children: 3  . Years of education: 62  . Highest education level: Not on file  Occupational History  . Occupation: [employer]  Social Needs  . Financial resource strain: Not on file  . Food insecurity    Worry: Not on file    Inability: Not on file  . Transportation needs    Medical: Not on file    Non-medical: Not on file  Tobacco Use  . Smoking status: Never Smoker  . Smokeless tobacco: Never Used  Substance and Sexual Activity  . Alcohol use: Yes    Alcohol/week:  7.0 - 14.0 standard drinks    Types: 7 - 14 Glasses of wine per week    Comment: 1-2 five oz glasses of wine/day  . Drug use: No  . Sexual activity: Yes    Partners: Male    Birth control/protection: Other-see comments    Comment: vasectomy-husband  Lifestyle  . Physical activity    Days per week: Not on file    Minutes per session: Not on file  . Stress: Not on file  Relationships  . Social Herbalist on phone: Not on file    Gets together: Not on file    Attends religious service: Not on file    Active  member of club or organization: Not on file    Attends meetings of clubs or organizations: Not on file    Relationship status: Not on file  . Intimate partner violence    Fear of current or ex partner: Not on file    Emotionally abused: Not on file    Physically abused: Not on file    Forced sexual activity: Not on file  Other Topics Concern  . Not on file  Social History Narrative   Lives with husband, children   Caffeine use:  1 cup coffee/day   Originally from [place], grew up in [place]    Family History  Problem Relation Age of Onset  . Prostate cancer Father 45       Metastatic CA to bones  . Hypertension Father   . Alcohol abuse Father   . Heart disease Father   . Cancer Father        Prostate  . Hyperlipidemia Mother   . Alcohol abuse Paternal Grandfather   . Heart disease Paternal Grandfather   . Dementia Maternal Grandmother   . Heart disease Maternal Grandfather   . Heart disease Paternal Grandmother   . Hypertension Brother   . Breast cancer Maternal Aunt   . Depression Maternal Aunt   . Diabetes Maternal Aunt   . Heart attack Maternal Uncle   . Heart disease Paternal Aunt   . Depression Maternal Aunt   . Diabetes Maternal Aunt   . Heart attack Paternal Uncle   . Migraines Neg Hx   . Colon cancer Neg Hx   . Esophageal cancer Neg Hx   . Rectal cancer Neg Hx   . Stomach cancer Neg Hx     Past Medical History:  Diagnosis Date  . Abnormal Pap smear of cervix 2007   CIN 2 + HRHPV/ LEEP  . Allergy   . Anemia   . Anxiety 2012  . Complication of anesthesia    bp dropped with epidural  . Contraception    husband vasectomy  . Diverticulosis   . Dysmenorrhea   . GERD (gastroesophageal reflux disease)   . Hashimoto's thyroiditis   . Heart murmur    as a child  . Hemangioma, intracranial structures ([place]) 2013   Left Frontal Lobe  . Hepatitis A   . Hypertension   . Hypothyroid    right thyroid nodule  . Migraines   . Panic disorder 2012  . Sinus  tachycardia 2012  . Vertigo     Past Surgical History:  Procedure Laterality Date  . CERVICAL BIOPSY  W/ LOOP ELECTRODE EXCISION  2007   CIN 2  . COLPOSCOPY  2007   CIN 2/ HRHPV  . DILATION AND CURETTAGE OF UTERUS    . SEPTOPLASTY    . TONSILLECTOMY  AND ADENOIDECTOMY  1988    Current Outpatient Medications  Medication Sig Dispense Refill  . cetirizine (ZYRTEC) 10 MG tablet Take 10 mg by mouth as needed for allergies.    . fluticasone (FLONASE) 50 MCG/ACT nasal spray Place into both nostrils daily as needed.     . loratadine (CLARITIN) 5 MG chewable tablet Chew 5 mg by mouth as needed.     Marland Kitchen losartan (COZAAR) 25 MG tablet Take 1 tablet (25 mg total) by mouth daily. 90 tablet 3  . Multiple Vitamin (MULTIVITAMIN) tablet Take 1 tablet by mouth every other day.     Marland Kitchen NATURE-THROID 32.5 MG tablet Take 1 tablet (32.5 mg total) by mouth daily. In combination with 48.75mg  90 tablet 1  . NATURE-THROID 48.75 MG TABS Take 1 tablet by mouth daily. In combination with 32.5mg  90 tablet 1  . OVER THE COUNTER MEDICATION Take by mouth daily. Nature Thyroid 32.5mg  daily Nature Thyroid 48.75 mg qod    . vitamin C (ASCORBIC ACID) 500 MG tablet Take 500 mg by mouth daily.     Current Facility-Administered Medications  Medication Dose Route Frequency Provider Last Rate Last Dose  . 0.9 %  sodium chloride infusion  500 mL Intravenous Once Mauri Pole, MD        Allergies as of 09/14/2018 - Review Complete 09/07/2018  Allergen Reaction Noted  . Penicillins  02/18/2011  . Latex  07/28/2017  . Monistat [miconazole] Itching 06/28/2015  . Sulfa antibiotics Other (See Comments) 03/15/2014  . Gluten meal Palpitations 01/06/2018    Vitals: BP (!) 143/92 (BP Location: Right Arm, Patient Position: Sitting)   Pulse 78   Temp (!) 96.8 F (36 C) Comment: taken by check-in staff  Ht 5\' 4"  (1.626 m)   Wt 179 lb (81.2 kg)   BMI 30.73 kg/m  Last Weight:  Wt Readings from Last 1 Encounters:   09/14/18 179 lb (81.2 kg)   Last Height:   Ht Readings from Last 1 Encounters:  09/14/18 5\' 4"  (1.626 m)    Physical exam: Exam: Gen: NAD, conversant, well nourised, obese, well groomed                     CV: RRR, no MRG. No Carotid Bruits. No peripheral edema, warm, nontender Eyes: Conjunctivae clear without exudates or hemorrhage  Neuro: Detailed Neurologic Exam  Speech:    Speech is normal; fluent and spontaneous with normal comprehension.  Cognition:    The patient is oriented to person, place, and time;     recent and remote memory intact;     language fluent;     normal attention, concentration,     fund of knowledge Cranial Nerves:    The pupils are equal, Barker, and reactive to light. The fundi are normal and spontaneous venous pulsations are present. Visual fields are full to finger confrontation. Extraocular movements are intact. Trigeminal sensation is intact and the muscles of mastication are normal. The face is symmetric. The palate elevates in the midline. Hearing intact. Voice is normal. Shoulder shrug is normal. The tongue has normal motion without fasciculations.   Coordination:    Normal finger to nose and heel to shin. Normal rapid alternating movements.   Gait:    Heel-toe and tandem gait are normal.   Motor Observation:    No asymmetry, no atrophy, and no involuntary movements noted. Tone:    Normal muscle tone.    Posture:    Posture is normal. normal  erect    Strength:    Strength is V/V in the upper and lower limbs.      Sensation: intact to LT     Reflex Exam:  DTR's:    Deep tendon reflexes in the upper and lower extremities are normal bilaterally.   Toes:    The toes are downgoing bilaterally.   Clonus:    Clonus is absent.   Assessment/Plan: 43 y.o. female here at the request of EmilyNche for hereditary cavernous malformation, family history on her father's side, multiple family members on that side have vascular lesions. This was  discovered many years ago and has remained stable and asymptomatic except having morning headaches now and new numbness in the hands. She has past medical history of migraines, vertigo and panic attacks,hypothyroidism possible graves disease. Discussion regarding cavernous hemangiomas, risk for bleeding, risks of neurosurgical intervention and removal. These lesions do carry a risk of bleeding yearly and are a cause for hemorrhagic events in the brain. Patient has been to neurosurgery and evaluated possible removal of the lesion and at this time she prefers to hold off. She understands the risks of intracranial bleeding. Had a long discussion on things to watch for, symptoms such as headache, focal weakness or sensory changes, encephalopathy, dizziness, gait disturbance, speech changes or anything concerning please procedure directly to the emergency room. Advised tight control of blood pressure. She also has migraines that are well controlled, follow these clinically.    MRI brain due to concerning symptoms of morning headaches, positional headaches,hand numbness in the setting of cavernous hemangioma need to image.  Hand Numbness: may be CTS, conservative measures and consider emg/ncs  Migraines and headaches: Botox for migraines. Failed multiple medications: amitriptyline, topamax, flexeril  Discussed: To prevent or relieve headaches, try the following:  Cool Compress. Lie down and place a cool compress on your head.   Avoid headache triggers. If certain foods or odors seem to have triggered your migraines in the past, avoid them. A headache diary might help you identify triggers.   Include physical activity in your daily routine. Try a daily walk or other moderate aerobic exercise.   Manage stress. Find healthy ways to cope with the stressors, such as delegating tasks on your to-do list.   Practice relaxation techniques. Try deep breathing, yoga, massage and visualization.   Eat  regularly. Eating regularly scheduled meals and maintaining a healthy diet might help prevent headaches. Also, drink plenty of fluids.   Follow a regular sleep schedule. Sleep deprivation might contribute to headaches  Consider biofeedback. With this mind-body technique, you learn to control certain bodily functions - such as muscle tension, heart rate and blood pressure - to prevent headaches or reduce headache pain.   Proceed to emergency room if you experience new or worsening symptoms or symptoms do not resolve, if you have new neurologic symptoms or if headache is severe, or for any concerning symptom.  Sarina Ill, MD  [facility] [address] [place], [place] [address]  Phone [phone] Fax [phone]  A total of 30 minutes was spent face-to-face with this patient. Over half this time was spent on counseling patient on the BPPV diagnosis and different diagnostic and therapeutic options available.

## 2018-09-15 ENCOUNTER — Encounter: Payer: Self-pay | Admitting: Neurology

## 2018-09-15 ENCOUNTER — Telehealth: Payer: Self-pay | Admitting: Neurology

## 2018-09-15 NOTE — Telephone Encounter (Signed)
Yes, she is on your schedule for Monday!

## 2018-09-15 NOTE — Progress Notes (Signed)
43 y.o. Emily Barker Married Hispanic female here for annual exam.    Menses every 28 days and last 4 - 5 days.  Can have some spotting intermittently for a week prior to her menses.   She notices this only with wiping, and not landing on her underwear or pad.  This is occurring for about 1 year.  No pain. Is having hot flashes mostly at night and if she drinks some wine.   States she has white coat syndrome.  States she is vegan.   She is jogging regularly, and her bladder function is better.  Urinary frequency.   She has a rash under her left arm from deodarent use. This is a chronic problem for her when she uses deodarent.   She is going to do Botox injections today for migraine HA.    Working at [employer].   PCP: Lynnae January, NP   Patient's last menstrual period was 09/07/2018.           Sexually active: Yes.    The current method of family planning is vasectomy.    Exercising: Yes.    jogging 2-3 miles/2x/week. aerobics at home Smoker:  no  Health Maintenance: Pap: 07-12-17 Neg:Neg HR HPV,03-13-15 Neg:Neg HR HPV History of abnormal Pap:  Yes, 2007 Hx colposcopy and LEEP procedure--CIN 2 with Pos. HR HPV MMG: 08-18-18 Neg/density c/BiRads1 Colonoscopy:  12/2017 normal--hemorrhoids;next 10 years BMD:   n/a  Result  n/a TDaP:  2011 Gardasil:   n/a HIV:neg in pregnancy Hep C:neg in pregnancy Screening Labs:   ---   reports that she has never smoked. She has never used smokeless tobacco. She reports current alcohol use of about 10.0 - 14.0 standard drinks of alcohol per week. She reports that she does not use drugs.  Past Medical History:  Diagnosis Date  . Abnormal Pap smear of cervix 2007   CIN 2 + HRHPV/ LEEP  . Allergy   . Anemia   . Anxiety 2012  . Complication of anesthesia    bp dropped with epidural  . Contraception    husband vasectomy  . Diverticulosis   . Dysmenorrhea   . GERD (gastroesophageal reflux disease)   . Hashimoto's  thyroiditis   . Heart murmur    as a child  . Hemangioma, intracranial structures ([place]) 2013   Left Frontal Lobe  . Hepatitis A   . Hypertension   . Hypothyroid    right thyroid nodule  . Migraines   . Panic disorder 2012  . Sinus tachycardia 2012  . Vertigo     Past Surgical History:  Procedure Laterality Date  . CERVICAL BIOPSY  W/ LOOP ELECTRODE EXCISION  2007   CIN 2  . COLPOSCOPY  2007   CIN 2/ HRHPV  . DILATION AND CURETTAGE OF UTERUS    . SEPTOPLASTY    . TONSILLECTOMY AND ADENOIDECTOMY  1988    Current Outpatient Medications  Medication Sig Dispense Refill  . cetirizine (ZYRTEC) 10 MG tablet Take 10 mg by mouth as needed for allergies.    . fluticasone (FLONASE) 50 MCG/ACT nasal spray Place into both nostrils daily as needed.     . loratadine (CLARITIN) 5 MG chewable tablet Chew 5 mg by mouth as needed.     Marland Kitchen losartan (COZAAR) 25 MG tablet Take 1 tablet (25 mg total) by mouth daily. 90 tablet 3  . Multiple Vitamin (MULTIVITAMIN) tablet Take 1 tablet by mouth every other day.     Marland Kitchen  NATURE-THROID 32.5 MG tablet Take 1 tablet (32.5 mg total) by mouth daily. In combination with 48.75mg  90 tablet 1  . NATURE-THROID 48.75 MG TABS Take 1 tablet by mouth daily. In combination with 32.5mg  90 tablet 1  . vitamin C (ASCORBIC ACID) 500 MG tablet Take 500 mg by mouth daily.     Current Facility-Administered Medications  Medication Dose Route Frequency Provider Last Rate Last Dose  . 0.9 %  sodium chloride infusion  500 mL Intravenous Once Nandigam, Venia Minks, MD        Family History  Problem Relation Age of Onset  . Prostate cancer Father 75       Metastatic CA to bones  . Hypertension Father   . Alcohol abuse Father   . Heart disease Father   . Cancer Father        Prostate  . Hyperlipidemia Mother   . Alcohol abuse Paternal Grandfather   . Heart disease Paternal Grandfather   . Dementia Maternal Grandmother   . Heart disease Maternal Grandfather   . Heart disease  Paternal Grandmother   . Hypertension Brother   . Breast cancer Maternal Aunt   . Depression Maternal Aunt   . Diabetes Maternal Aunt   . Heart attack Maternal Uncle   . Heart disease Paternal Aunt   . Depression Maternal Aunt   . Diabetes Maternal Aunt   . Heart attack Paternal Uncle   . Migraines Neg Hx   . Colon cancer Neg Hx   . Esophageal cancer Neg Hx   . Rectal cancer Neg Hx   . Stomach cancer Neg Hx     Review of Systems  All other systems reviewed and are negative.   Exam:   BP 140/86 (Cuff Size: Large)   Pulse 76   Temp (!) 97.3 F (36.3 C) (Temporal)   Resp 14   Ht 5\' 4"  (1.626 m)   Wt 178 lb 9.6 oz (81 kg)   LMP 09/07/2018   BMI 30.66 kg/m     General appearance: alert, cooperative and appears stated age Head: normocephalic, without obvious abnormality, atraumatic Neck: no adenopathy, supple, symmetrical, trachea midline and thyroid normal to inspection and palpation Lungs: clear to auscultation bilaterally Breasts: normal appearance, no masses or tenderness, No nipple retraction or dimpling, No nipple discharge or bleeding, No axillary adenopathy Heart: regular rate and rhythm Abdomen: soft, non-tender; no masses, no organomegaly Extremities: extremities normal, atraumatic, no cyanosis or edema Skin: skin color, texture, turgor normal. No rashes or lesions Lymph nodes: cervical, supraclavicular, and axillary nodes normal. Neurologic: grossly normal  Pelvic: External genitalia:  no lesions              No abnormal inguinal nodes palpated.              Urethra:  normal appearing urethra with no masses, tenderness or lesions              Bartholins and Skenes: normal                 Vagina: normal appearing vagina with normal color and discharge, no lesions              Cervix: no lesions              Pap taken: No. Bimanual Exam:  Uterus:  normal size, contour, position, consistency, mobility, non-tender              Adnexa: no mass, fullness,  tenderness  Rectal exam: Yes.  .  Confirms.              Anus:  normal sphincter tone, no lesions  Chaperone was present for exam.  Assessment:   Well woman visit with normal exam. Hx LEEP.  Stress incontinence.  Urinary urgency.  Menopausal symptoms.  Skin rash - looks like Candida left axilla.  Migraine HA.   Plan: Mammogram screening discussed. Self breast awareness reviewed. Pap and HR HPV as above. Guidelines for Calcium, Vitamin D, regular exercise program including cardiovascular and weight bearing exercise. We discussed bladder irritants.  Nystatin powder.  Follow up annually and prn.   After visit summary provided.

## 2018-09-15 NOTE — Telephone Encounter (Signed)
LVM for pt to call back about scheduling mri  [facility] Auth: [place] Ref # [id number]

## 2018-09-15 NOTE — Telephone Encounter (Signed)
Daniel, did I tak to you about botox initiation for this patient?  [place], [place]

## 2018-09-15 NOTE — Telephone Encounter (Signed)
Noted, thanks!

## 2018-09-17 DIAGNOSIS — H5201 Hypermetropia, right eye: Secondary | ICD-10-CM | POA: Diagnosis not present

## 2018-09-18 MED FILL — LOSARTAN POTASSIUM 25 MG TA: 25 | 30 days supply | Qty: 30 | Fill #1

## 2018-09-19 ENCOUNTER — Encounter: Payer: Self-pay | Admitting: Obstetrics and Gynecology

## 2018-09-19 ENCOUNTER — Ambulatory Visit (INDEPENDENT_AMBULATORY_CARE_PROVIDER_SITE_OTHER): Payer: 59 | Admitting: Obstetrics and Gynecology

## 2018-09-19 ENCOUNTER — Ambulatory Visit (INDEPENDENT_AMBULATORY_CARE_PROVIDER_SITE_OTHER): Payer: 59 | Admitting: Neurology

## 2018-09-19 ENCOUNTER — Telehealth: Payer: Self-pay | Admitting: Neurology

## 2018-09-19 ENCOUNTER — Other Ambulatory Visit: Payer: Self-pay

## 2018-09-19 VITALS — BP 140/86 | HR 76 | Temp 97.3°F | Resp 14 | Ht 64.0 in | Wt 178.6 lb

## 2018-09-19 DIAGNOSIS — G43709 Chronic migraine without aura, not intractable, without status migrainosus: Secondary | ICD-10-CM

## 2018-09-19 DIAGNOSIS — Z01419 Encounter for gynecological examination (general) (routine) without abnormal findings: Secondary | ICD-10-CM | POA: Diagnosis not present

## 2018-09-19 MED ORDER — NYSTATIN 100000 UNIT/GM EX POWD: Freq: Three times a day (TID) | CUTANEOUS | 2 refills | Status: DC

## 2018-09-19 MED FILL — NYSTATIN 100000 UNIT/GM POW: 100000 | 7 days supply | Qty: 15 | Fill #0

## 2018-09-19 NOTE — Telephone Encounter (Signed)
I called the patients insurance and checked the pre cert requirements I spoke with Copywriter, advertising. She stated Dale ref#[id number]. DW

## 2018-09-19 NOTE — Telephone Encounter (Signed)
Patient returned my call she is scheduled at [facility] for 10/05/18. No to the covid questions.

## 2018-09-19 NOTE — Progress Notes (Signed)
Botox consent signed by patient Botox- 200 units x 1 vial Lot: [id number] Expiration: 03/2021 NDC: [id number]  Bacteriostatic 0.9% Sodium Chloride- 54mL total Lot: [id number] Expiration: 10/27/2018 NDC: [id number]  Dx: [id number] B/B

## 2018-09-19 NOTE — Progress Notes (Signed)
° °Consent Form °Botulism Toxin Injection For Chronic Migraine ° ° ° °Reviewed orally with patient, additionally signature is on file: ° °Botulism toxin has been approved by the Federal drug administration for treatment of chronic migraine. Botulism toxin does not cure chronic migraine and it may not be effective in some patients. ° °The administration of botulism toxin is accomplished by injecting a small amount of toxin into the muscles of the neck and head. Dosage must be titrated for each individual. Any benefits resulting from botulism toxin tend to wear off after 3 months with a repeat injection required if benefit is to be maintained. Injections are usually done every 3-4 months with maximum effect peak achieved by about 2 or 3 weeks. Botulism toxin is expensive and you should be sure of what costs you will incur resulting from the injection. ° °The side effects of botulism toxin use for chronic migraine may include: ° ° -Transient, and usually mild, facial weakness with facial injections ° -Transient, and usually mild, head or neck weakness with head/neck injections ° -Reduction or loss of forehead facial animation due to forehead muscle weakness ° -Eyelid drooping ° -Dry eye ° -Pain at the site of injection or bruising at the site of injection ° -Double vision ° -Potential unknown long term risks ° °Contraindications: You should not have Botox if you are pregnant, nursing, allergic to albumin, have an infection, skin condition, or muscle weakness at the site of the injection, or have myasthenia gravis, Lambert-Eaton syndrome, or ALS. ° °It is also possible that as with any injection, there may be an allergic reaction or no effect from the medication. Reduced effectiveness after repeated injections is sometimes seen and rarely infection at the injection site may occur. All care will be taken to prevent these side effects. If therapy is given over a long time, atrophy and wasting in the muscle injected may  occur. Occasionally the patient's become refractory to treatment because they develop antibodies to the toxin. In this event, therapy needs to be modified. ° °I have read the above information and consent to the administration of botulism toxin. ° ° ° °BOTOX PROCEDURE NOTE FOR MIGRAINE HEADACHE ° ° ° °Contraindications and precautions discussed with patient(above). Aseptic procedure was observed and patient tolerated procedure. Procedure performed by Dr. Toni Tewana Bohlen ° °The condition has existed for more than 6 months, and pt does not have a diagnosis of ALS, Myasthenia Gravis or Lambert-Eaton Syndrome.  Risks and benefits of injections discussed and pt agrees to proceed with the procedure.  Written consent obtained ° °These injections are medically necessary. Pt  receives good benefits from these injections. These injections do not cause sedations or hallucinations which the oral therapies may cause. ° °Description of procedure: ° °The patient was placed in a sitting position. The standard protocol was used for Botox as follows, with 5 units of Botox injected at each site: ° ° °-Procerus muscle, midline injection ° °-Corrugator muscle, bilateral injection ° °-Frontalis muscle, bilateral injection, with 2 sites each side, medial injection was performed in the upper one third of the frontalis muscle, in the region vertical from the medial inferior edge of the superior orbital rim. The lateral injection was again in the upper one third of the forehead vertically above the lateral limbus of the cornea, 1.5 cm lateral to the medial injection site. ° °- Levator Scapulae: 5 units bilaterally ° °-Temporalis muscle injection, 5 sites, bilaterally. The first injection was 3 cm above the tragus of the ear,   second injection site was 1.5 cm to 3 cm up from the first injection site in line with the tragus of the ear. The third injection site was 1.5-3 cm forward between the first 2 injection sites. The fourth injection site was  1.5 cm posterior to the second injection site. 5th site laterally in the temporalis  muscleat the level of the outer canthus. ° °- Patient feels her clenching is a trigger for headaches. +5 units masseter bilaterally  ° °- Patient feels the migraines are centered around the eyes +5 units bilaterally at the outer canthus in the orbicularis occuli ° °-Occipitalis muscle injection, 3 sites, bilaterally. The first injection was done one half way between the occipital protuberance and the tip of the mastoid process behind the ear. The second injection site was done lateral and superior to the first, 1 fingerbreadth from the first injection. The third injection site was 1 fingerbreadth superiorly and medially from the first injection site. ° °-Cervical paraspinal muscle injection, 2 sites, bilateral knee first injection site was 1 cm from the midline of the cervical spine, 3 cm inferior to the lower border of the occipital protuberance. The second injection site was 1.5 cm superiorly and laterally to the first injection site. ° °-Trapezius muscle injection was performed at 3 sites, bilaterally. The first injection site was in the upper trapezius muscle halfway between the inflection point of the neck, and the acromion. The second injection site was one half way between the acromion and the first injection site. The third injection was done between the first injection site and the inflection point of the neck. ° ° °Will return for repeat injection in 3 months. ° ° °A 200 unit sof Botox was used, any Botox not injected was wasted. The patient tolerated the procedure well, there were no complications of the above procedure. ° ° °

## 2018-09-19 NOTE — Patient Instructions (Signed)

## 2018-09-28 ENCOUNTER — Telehealth: Payer: Self-pay | Admitting: Nurse Practitioner

## 2018-09-28 DIAGNOSIS — E039 Hypothyroidism, unspecified: Secondary | ICD-10-CM

## 2018-09-28 DIAGNOSIS — E041 Nontoxic single thyroid nodule: Secondary | ICD-10-CM

## 2018-09-28 NOTE — Telephone Encounter (Signed)
Please inform patient of options

## 2018-09-28 NOTE — Telephone Encounter (Signed)
[facility] send a message stating Nature-Throid 48.75 and 32.5 mg has been recall. They request PCP contact pt for medication change options. Please advise.

## 2018-09-28 NOTE — Telephone Encounter (Signed)
Ask pharmacy which dessicated thyroid is available?

## 2018-09-28 NOTE — Telephone Encounter (Signed)
[facility] stated that they have: NP thyroid, Synthroid and Levothyroxine.   This is manufacture issue and do not have information of date it will be release back on the market.

## 2018-09-29 MED ORDER — THYROID 30 MG PO TABS: ORAL_TABLET | ORAL | 5 refills | Status: DC

## 2018-09-29 MED ORDER — THYROID 90 MG PO TABS: ORAL_TABLET | ORAL | 5 refills | Status: DC

## 2018-09-29 MED ORDER — THYROID 60 MG PO TABS: ORAL_TABLET | ORAL | 5 refills | Status: DC

## 2018-09-29 MED FILL — NP THYROID 90 MG TABLET: 90 | 16 days supply | Qty: 16 | Fill #0

## 2018-09-29 MED FILL — NP THYROID 60 MG TABLET: 60 | 28 days supply | Qty: 12 | Fill #0

## 2018-09-29 NOTE — Telephone Encounter (Signed)
LVM for the pt to call back, need to inform pt about medication.

## 2018-09-29 NOTE — Telephone Encounter (Signed)
Correction: Sent 90mg  (M, W, F, Sun) and 60mg  tabs (T, Th, Sat) Inform patient about the absence of tablet to constitute exactly 81.25mg . Will need to repeat labs in 6-8weeks to ensure levels remain stable with this combination. Lab orders entered

## 2018-09-29 NOTE — Addendum Note (Signed)
Addended by: Leana Gamer on: 09/29/2018 02:32 PM   Modules accepted: Orders

## 2018-09-29 NOTE — Telephone Encounter (Signed)
Sent 90mg  (M, W, F, Sun) and 30mg  tabs (T, Th, Sat) Inform patient about the absence of tablet to constitute exactly 81.25mg . Will need to repeat labs in 6-8weeks to ensure levels remain stable with this combination. Lab orders entered

## 2018-09-29 NOTE — Telephone Encounter (Signed)
Spoke with the pt, she stated that she can take armour thyroid (she can only take desiccate thyroid and levothyroxine & synthroid is not working good for her).   [facility] has Armour Thyroid available--Charlotte please advise.

## 2018-09-29 NOTE — Telephone Encounter (Signed)
Charlotte please recheck the 30 mg.

## 2018-10-05 ENCOUNTER — Ambulatory Visit: Payer: 59

## 2018-10-05 ENCOUNTER — Other Ambulatory Visit: Payer: Self-pay

## 2018-10-05 DIAGNOSIS — Q283 Other malformations of cerebral vessels: Secondary | ICD-10-CM | POA: Diagnosis not present

## 2018-10-05 DIAGNOSIS — R51 Headache with orthostatic component, not elsewhere classified: Secondary | ICD-10-CM

## 2018-10-05 DIAGNOSIS — G43709 Chronic migraine without aura, not intractable, without status migrainosus: Secondary | ICD-10-CM | POA: Diagnosis not present

## 2018-10-05 DIAGNOSIS — R200 Anesthesia of skin: Secondary | ICD-10-CM | POA: Diagnosis not present

## 2018-10-05 DIAGNOSIS — R519 Headache, unspecified: Secondary | ICD-10-CM

## 2018-10-05 MED ORDER — GADOBENATE DIMEGLUMINE 529 MG/ML IV SOLN
15.0000 mL | Freq: Once | INTRAVENOUS | Status: AC | PRN
  Administered 2018-10-05: 15 mL via INTRAVENOUS

## 2018-10-06 NOTE — Telephone Encounter (Signed)
The patient is concerned about waiting until 12/2 for her next round, I informed her Dr. Jaynee Eagles is out for Thanksgiving. She is going to call back closer to time if she would like to change to the NP's schedule. DW

## 2018-10-07 NOTE — Telephone Encounter (Signed)
Pt is aware.  

## 2018-10-18 MED FILL — LOSARTAN POTASSIUM 25 MG TA: 25 | 30 days supply | Qty: 30 | Fill #2

## 2018-11-19 MED FILL — LOSARTAN POTASSIUM 25 MG TA: 25 | 30 days supply | Qty: 30 | Fill #3

## 2018-12-09 ENCOUNTER — Ambulatory Visit: Payer: 59 | Admitting: Nurse Practitioner

## 2018-12-09 ENCOUNTER — Encounter: Payer: Self-pay | Admitting: Nurse Practitioner

## 2018-12-09 ENCOUNTER — Other Ambulatory Visit: Payer: Self-pay

## 2018-12-09 VITALS — BP 110/82 | HR 79 | Temp 97.3°F | Ht 64.0 in | Wt 178.6 lb

## 2018-12-09 DIAGNOSIS — B09 Unspecified viral infection characterized by skin and mucous membrane lesions: Secondary | ICD-10-CM

## 2018-12-09 MED ORDER — BENADRYL ITCH RELIEF 2-0.1 % EX STCK: 1.0000 "application " | Freq: Three times a day (TID) | CUTANEOUS | Status: DC | PRN

## 2018-12-09 NOTE — Patient Instructions (Signed)
Pityriasis Rosea  Pityriasis rosea is a rash that usually appears on the chest, abdomen, and back. It may also appear on the upper arms and upper legs. It usually begins as a single patch, and then more patches start to develop. The rash may cause mild itching, but it normally does not cause other problems. It usually goes away without treatment. However, it may take weeks or months for the rash to go away completely.  What are the causes?  The cause of this condition is not known. The condition does not spread from person to person (is not contagious).  What increases the risk?  This condition is more likely to develop in:  · Persons aged 10-35 years.  · Pregnant women.  It is more common in the spring and fall seasons.  What are the signs or symptoms?  The main symptom of this condition is a rash.  · The rash usually begins with a single oval patch that is larger than the ones that follow. This is called a herald patch. It generally appears a week or more before the rest of the rash appears.  · When more patches start to develop, they spread quickly on the chest, abdomen, back, arms, and legs. These patches are smaller than the first one.  · The patches that make up the rash are usually oval-shaped and pink or red in color. They are usually flat but may sometimes be raised so that they can be felt with a finger. They may also be finely crinkled and have a scaly ring around the edge.  Some people may have mild itching and nonspecific symptoms, such as:  · Nausea.  · Loss of appetite.  · Difficulty concentrating.  · Headache.  · Irritability.  · Sore throat.  · Mild fever.  How is this diagnosed?  This condition may be diagnosed based on:  · Your medical history and a physical exam.  · Tests to rule out other causes. This may include blood tests or a test in which a small sample of skin is removed from the rash (biopsy) and checked in a lab.  How is this treated?         Treatment is not usually needed for this  condition. The rash will often go away on its own in 4-8 weeks. In some cases, a health care provider may recommend or prescribe medicine to reduce itching.  Follow these instructions at home:  · Take or apply over-the-counter and prescription medicines only as told by your health care provider.  · Avoid scratching the affected areas of skin.  · Do not take hot baths or use a sauna. Use only warm water when bathing or showering. Heat can increase itching. Adding cornstarch to your bath may help to relieve the itching.  · Avoid exposure to the sun and other sources of UV light, such as tanning beds, as told by your health care provider. UV light may help the rash go away but may cause unwanted changes in skin color.  · Keep all follow-up visits as told by your health care provider. This is important.  Contact a health care provider if:  · Your rash does not go away in 8 weeks.  · Your rash gets much worse.  · You have a fever.  · You have swelling or pain in the rash area.  · You have fluid, blood, or pus coming from the rash area.  Summary  · Pityriasis rosea is a rash that   The herald patch is larger than the ones that follow.  The rash may cause mild itching, but it usually does not cause other problems. It usually goes away without treatment in 4-8 weeks.  In some cases, a health care provider may recommend or prescribe medicine to reduce itching. This information is not intended to replace advice given to you by your health care provider. Make sure you discuss any questions you have with your health care provider. Document Released: 02/18/2001 Document Revised: 01/11/2017 Document Reviewed: 01/11/2017 Elsevier Patient Education  2020 [employer].

## 2018-12-12 ENCOUNTER — Encounter: Payer: Self-pay | Admitting: Nurse Practitioner

## 2018-12-12 NOTE — Progress Notes (Signed)
Subjective:  Patient ID: Emily Barker, female    DOB: November 03, 1975  Age: 43 y.o. MRN: [id number]  CC: Rash (rash under breast area/itchy at times/1 wk/)  Rash This is a new problem. The current episode started in the past 7 days. The problem has been gradually improving since onset. The affected locations include the torso. The rash is characterized by itchiness and scaling. It is unknown if there was an exposure to a precipitant. Pertinent negatives include no anorexia, congestion, cough, facial edema, fatigue, fever, joint pain, nail changes or rhinorrhea. Past treatments include moisturizer and topical steroids. The treatment provided mild relief. There is no history of allergies, asthma, eczema or varicella.   Reviewed past Medical, Social and Family history today.  Outpatient Medications Prior to Visit  Medication Sig Dispense Refill  . cetirizine (ZYRTEC) 10 MG tablet Take 10 mg by mouth as needed for allergies.    . fluticasone (FLONASE) 50 MCG/ACT nasal spray Place into both nostrils daily as needed.     . loratadine (CLARITIN) 5 MG chewable tablet Chew 5 mg by mouth as needed.     Marland Kitchen losartan (COZAAR) 25 MG tablet Take 1 tablet (25 mg total) by mouth daily. 90 tablet 3  . Multiple Vitamin (MULTIVITAMIN) tablet Take 1 tablet by mouth every other day.     . thyroid [facility] THYROID) 90 MG tablet Take on Monday, Wednesday, Friday and Sunday. 16 tablet 5  . thyroid (ARMOUR) 60 MG tablet Take on Tuesday, Thursday and Saturday 12 tablet 5  . vitamin C (ASCORBIC ACID) 500 MG tablet Take 500 mg by mouth daily.    Marland Kitchen nystatin (MYCOSTATIN/NYSTOP) powder Apply topically 3 (three) times daily. Apply to affected area for up to 7 days (Patient not taking: Reported on 12/09/2018) 15 g 2   Facility-Administered Medications Prior to Visit  Medication Dose Route Frequency Provider Last Rate Last Dose  . 0.9 %  sodium chloride infusion  500 mL Intravenous Once Nandigam, Kavitha V, MD        ROS See  HPI  Objective:  BP 110/82   Pulse 79   Temp (!) 97.3 F (36.3 C) (Tympanic)   Ht 5\' 4"  (1.626 m)   Wt 178 lb 9.6 oz (81 kg)   SpO2 98%   BMI 30.66 kg/m   BP Readings from Last 3 Encounters:  12/09/18 110/82  09/19/18 140/86  09/14/18 (!) 143/92   Wt Readings from Last 3 Encounters:  12/09/18 178 lb 9.6 oz (81 kg)  09/19/18 178 lb 9.6 oz (81 kg)  09/14/18 179 lb (81.2 kg)   Physical Exam Skin:    Findings: Rash present. Rash is macular.          Lab Results  Component Value Date   WBC 5.2 09/06/2018   HGB 12.2 09/06/2018   HCT 37.3 09/06/2018   PLT 241.0 09/06/2018   GLUCOSE 93 09/06/2018   CHOL 210 (H) 09/06/2018   TRIG 106.0 09/06/2018   HDL 57.60 09/06/2018   LDLCALC 132 (H) 09/06/2018   ALT 15 09/06/2018   AST 17 09/06/2018   NA 139 09/06/2018   K 4.5 09/06/2018   CL 104 09/06/2018   CREATININE 0.75 09/06/2018   BUN 10 09/06/2018   CO2 29 09/06/2018   TSH 3.46 07/27/2018   HGBA1C 5.6 09/06/2018   Mm Digital Screening Bilateral  Result Date: 08/19/2018 CLINICAL DATA:  Screening. EXAM: DIGITAL SCREENING BILATERAL MAMMOGRAM WITH CAD COMPARISON:  Previous exam(s). ACR Breast Density Category c:  The breast tissue is heterogeneously dense, which may obscure small masses. FINDINGS: There are no findings suspicious for malignancy. Images were processed with CAD. IMPRESSION: No mammographic evidence of malignancy. A result letter of this screening mammogram will be mailed directly to the patient. RECOMMENDATION: Screening mammogram in one year. (Code:SM-B-01Y) BI-RADS CATEGORY  1: Negative. Electronically Signed   By: Ammie Ferrier M.D.   On: 08/19/2018 11:44    Assessment & Plan:   Emily Barker was seen today for rash.  Diagnoses and all orders for this visit:  Viral exanthem -     diphenhydrAMINE-Zinc Acetate (BENADRYL ITCH RELIEF STICK) 2-0.1 % STCK; Apply 1 application topically every 8 (eight) hours as needed.   I am having Emily Barker start on  [place]. I am also having her maintain her loratadine, fluticasone, multivitamin, vitamin C, cetirizine, losartan, nystatin, thyroid, and thyroid. We will continue to administer sodium chloride.  Meds ordered this encounter  Medications  . diphenhydrAMINE-Zinc Acetate (BENADRYL ITCH RELIEF STICK) 2-0.1 % STCK    Sig: Apply 1 application topically every 8 (eight) hours as needed.    Order Specific Question:   Supervising Provider    Answer:   Lucille Passy [[id number]]   Problem List Items Addressed This Visit    None    Visit Diagnoses    Viral exanthem    -  Primary   Relevant Medications   diphenhydrAMINE-Zinc Acetate (BENADRYL ITCH RELIEF STICK) 2-0.1 % STCK      Follow-up: Return if symptoms worsen or fail to improve.  Wilfred Lacy, NP

## 2018-12-13 NOTE — Progress Notes (Deleted)
Consent Form Botulism Toxin Injection For Chronic Migraine    Reviewed orally with patient, additionally signature is on file:  Botulism toxin has been approved by the Federal drug administration for treatment of chronic migraine. Botulism toxin does not cure chronic migraine and it may not be effective in some patients.  The administration of botulism toxin is accomplished by injecting a small amount of toxin into the muscles of the neck and head. Dosage must be titrated for each individual. Any benefits resulting from botulism toxin tend to wear off after 3 months with a repeat injection required if benefit is to be maintained. Injections are usually done every 3-4 months with maximum effect peak achieved by about 2 or 3 weeks. Botulism toxin is expensive and you should be sure of what costs you will incur resulting from the injection.  The side effects of botulism toxin use for chronic migraine may include:   -Transient, and usually mild, facial weakness with facial injections  -Transient, and usually mild, head or neck weakness with head/neck injections  -Reduction or loss of forehead facial animation due to forehead muscle weakness  -Eyelid drooping  -Dry eye  -Pain at the site of injection or bruising at the site of injection  -Double vision  -Potential unknown long term risks   Contraindications: You should not have Botox if you are pregnant, nursing, allergic to albumin, have an infection, skin condition, or muscle weakness at the site of the injection, or have myasthenia gravis, Lambert-Eaton syndrome, or ALS.  It is also possible that as with any injection, there may be an allergic reaction or no effect from the medication. Reduced effectiveness after repeated injections is sometimes seen and rarely infection at the injection site may occur. All care will be taken to prevent these side effects. If therapy is given over a long time, atrophy and wasting in the muscle injected may  occur. Occasionally the patient's become refractory to treatment because they develop antibodies to the toxin. In this event, therapy needs to be modified.  I have read the above information and consent to the administration of botulism toxin.    BOTOX PROCEDURE NOTE FOR MIGRAINE HEADACHE  Contraindications and precautions discussed with patient(above). Aseptic procedure was observed and patient tolerated procedure. Procedure performed by Debbora Presto, FNP-C.   The condition has existed for more than 6 months, and pt does not have a diagnosis of ALS, Myasthenia Gravis or Lambert-Eaton Syndrome.  Risks and benefits of injections discussed and pt agrees to proceed with the procedure.  Written consent obtained  These injections are medically necessary. Pt  receives good benefits from these injections. These injections do not cause sedations or hallucinations which the oral therapies may cause.   Description of procedure:  The patient was placed in a sitting position. The standard protocol was used for Botox as follows, with 5 units of Botox injected at each site:  -Procerus muscle, midline injection  -Corrugator muscle, bilateral injection  -Frontalis muscle, bilateral injection, with 2 sites each side, medial injection was performed in the upper one third of the frontalis muscle, in the region vertical from the medial inferior edge of the superior orbital rim. The lateral injection was again in the upper one third of the forehead vertically above the lateral limbus of the cornea, 1.5 cm lateral to the medial injection site.  -Temporalis muscle injection, 4 sites, bilaterally. The first injection was 3 cm above the tragus of the ear, second injection site was 1.5 cm to  3 cm up from the first injection site in line with the tragus of the ear. The third injection site was 1.5-3 cm forward between the first 2 injection sites. The fourth injection site was 1.5 cm posterior to the second injection  site. 5th site laterally in the temporalis  muscleat the level of the outer canthus.  -Occipitalis muscle injection, 3 sites, bilaterally. The first injection was done one half way between the occipital protuberance and the tip of the mastoid process behind the ear. The second injection site was done lateral and superior to the first, 1 fingerbreadth from the first injection. The third injection site was 1 fingerbreadth superiorly and medially from the first injection site.  -Cervical paraspinal muscle injection, 2 sites, bilateral knee first injection site was 1 cm from the midline of the cervical spine, 3 cm inferior to the lower border of the occipital protuberance. The second injection site was 1.5 cm superiorly and laterally to the first injection site.  -Trapezius muscle injection was performed at 3 sites, bilaterally. The first injection site was in the upper trapezius muscle halfway between the inflection point of the neck, and the acromion. The second injection site was one half way between the acromion and the first injection site. The third injection was done between the first injection site and the inflection point of the neck.   Will return for repeat injection in 3 months.   A 155 units of Botox was used, any Botox not injected was wasted. The patient tolerated the procedure well, there were no complications of the above procedure.

## 2018-12-14 ENCOUNTER — Ambulatory Visit: Payer: 59 | Admitting: Family Medicine

## 2018-12-14 ENCOUNTER — Encounter: Payer: Self-pay | Admitting: Family Medicine

## 2018-12-19 MED FILL — LOSARTAN POTASSIUM 25 MG TA: 25 | 30 days supply | Qty: 30 | Fill #4

## 2018-12-28 ENCOUNTER — Ambulatory Visit: Payer: 59 | Admitting: Neurology

## 2019-01-17 ENCOUNTER — Telehealth: Payer: Self-pay | Admitting: *Deleted

## 2019-01-17 NOTE — Telephone Encounter (Signed)
Called pt yesterday and LVM asking for call back to r/s her 12/29 appt d/t provider being out of the office. I called the pt again this morning and LVM asking for a call back as soon as possible as we may be able to see her tomorrow afternoon but need to back from her first. Left office number in message.

## 2019-01-18 MED FILL — LOSARTAN POTASSIUM 25 MG TA: 25 | 30 days supply | Qty: 30 | Fill #5

## 2019-01-18 NOTE — Telephone Encounter (Addendum)
Will hold to see if an appointment opens for 1st week of January. I have sent a my chart message advising we would be in touch and we are monitoring the schedule for an opening.

## 2019-01-18 NOTE — Telephone Encounter (Signed)
Patient states she feels her accommodations should be met since she set this appt months ago 

## 2019-01-18 NOTE — Telephone Encounter (Signed)
I called the patient and she requested an apt the first week of Jan. DW

## 2019-01-18 NOTE — Telephone Encounter (Signed)
I offered patient apt today and she declined.

## 2019-01-18 NOTE — Telephone Encounter (Signed)
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

## 2019-01-21 NOTE — Telephone Encounter (Signed)
I sent her an email, I would like to work her in if possible. I could come in earlier Wednesday or at the end of the day Monday or Tuesday or lunchtime.

## 2019-01-23 NOTE — Telephone Encounter (Signed)
Received call back from patient and offered her morning or afternoon appt tomorrow. She stated that she had rearranged her schedule specifically so she could come any time week of Jan 4th. She has apt tomorrow morning, and a patient tomorrow afternoon, so unable to come then.  We scheduled her for Emily Barker. Patient verbalized understanding, appreciation.

## 2019-01-23 NOTE — Telephone Encounter (Signed)
Thank you! The 4th is fine, it is for botox. thanks

## 2019-01-23 NOTE — Telephone Encounter (Signed)
Attempted to reach patient to schedule her to see Dr Jaynee Eagles. Phone call was picked up, but no ome answered.

## 2019-01-23 NOTE — Telephone Encounter (Signed)
I have to get my vaccine at 330 tomorrow but you could add her at 4pm on Tuesday or any other place is fine. I'm even happy to come in Wednsday morning (I am "off" but I dont want to make her wait, I'll come in).

## 2019-01-24 ENCOUNTER — Ambulatory Visit: Payer: 59 | Admitting: Neurology

## 2019-01-30 ENCOUNTER — Other Ambulatory Visit: Payer: Self-pay

## 2019-01-30 ENCOUNTER — Ambulatory Visit (INDEPENDENT_AMBULATORY_CARE_PROVIDER_SITE_OTHER): Payer: 59 | Admitting: Neurology

## 2019-01-30 VITALS — Temp 97.9°F

## 2019-01-30 DIAGNOSIS — G43709 Chronic migraine without aura, not intractable, without status migrainosus: Secondary | ICD-10-CM | POA: Diagnosis not present

## 2019-01-30 NOTE — Progress Notes (Signed)
Botox- 200 units x 1 vial Lot: [id number] Expiration: 09/2021 NDC: [id number]  Bacteriostatic 0.9% Sodium Chloride- 34mL total Lot: [id number] Expiration: 04/27/2019 NDC: [id number]  Dx: [id number] B/B

## 2019-01-30 NOTE — Progress Notes (Signed)
Consent Form Botulism Toxin Injection For Chronic Migraine  This is our second botox. She did great, she noticed it wearing off in about 10 weeks and migraines returning. Improved frequency of migraines and the shooting pains stopped completely and headache frequency improved as well. She only had 2 migraines since last botox. She had a strange headache in the back of the head which was unusual but otherwise significant improvement much greater than 50% improvement in frequency and severity now tylenol even works for her migraines as they are less severe. Her forehead was a little heavy, check if this time it was heavy and change forehead injections a little higher instead. +a.   Reviewed orally with patient, additionally signature is on file:  Botulism toxin has been approved by the Federal drug administration for treatment of chronic migraine. Botulism toxin does not cure chronic migraine and it may not be effective in some patients.  The administration of botulism toxin is accomplished by injecting a small amount of toxin into the muscles of the neck and head. Dosage must be titrated for each individual. Any benefits resulting from botulism toxin tend to wear off after 3 months with a repeat injection required if benefit is to be maintained. Injections are usually done every 3-4 months with maximum effect peak achieved by about 2 or 3 weeks. Botulism toxin is expensive and you should be sure of what costs you will incur resulting from the injection.  The side effects of botulism toxin use for chronic migraine may include:   -Transient, and usually mild, facial weakness with facial injections  -Transient, and usually mild, head or neck weakness with head/neck injections  -Reduction or loss of forehead facial animation due to forehead muscle weakness  -Eyelid drooping  -Dry eye  -Pain at the site of injection or bruising at the site of injection  -Double vision  -Potential unknown long term  risks  Contraindications: You should not have Botox if you are pregnant, nursing, allergic to albumin, have an infection, skin condition, or muscle weakness at the site of the injection, or have myasthenia gravis, Lambert-Eaton syndrome, or ALS.  It is also possible that as with any injection, there may be an allergic reaction or no effect from the medication. Reduced effectiveness after repeated injections is sometimes seen and rarely infection at the injection site may occur. All care will be taken to prevent these side effects. If therapy is given over a long time, atrophy and wasting in the muscle injected may occur. Occasionally the patient's become refractory to treatment because they develop antibodies to the toxin. In this event, therapy needs to be modified.  I have read the above information and consent to the administration of botulism toxin.    BOTOX PROCEDURE NOTE FOR MIGRAINE HEADACHE    Contraindications and precautions discussed with patient(above). Aseptic procedure was observed and patient tolerated procedure. Procedure performed by Dr. Georgia Dom  The condition has existed for more than 6 months, and pt does not have a diagnosis of ALS, Myasthenia Gravis or Lambert-Eaton Syndrome.  Risks and benefits of injections discussed and pt agrees to proceed with the procedure.  Written consent obtained  These injections are medically necessary. Pt  receives good benefits from these injections. These injections do not cause sedations or hallucinations which the oral therapies may cause.  Description of procedure:  The patient was placed in a sitting position. The standard protocol was used for Botox as follows, with 5 units of Botox injected at  each site:   -Procerus muscle, midline injection  -Corrugator muscle, bilateral injection  -Frontalis muscle, bilateral injection, with 2 sites each side, medial injection was performed in the upper one third of the frontalis muscle, in  the region vertical from the medial inferior edge of the superior orbital rim. The lateral injection was again in the upper one third of the forehead vertically above the lateral limbus of the cornea, 1.5 cm lateral to the medial injection site.  - Levator Scapulae: 5 units bilaterally  -Temporalis muscle injection, 5 sites, bilaterally. The first injection was 3 cm above the tragus of the ear, second injection site was 1.5 cm to 3 cm up from the first injection site in line with the tragus of the ear. The third injection site was 1.5-3 cm forward between the first 2 injection sites. The fourth injection site was 1.5 cm posterior to the second injection site. 5th site laterally in the temporalis  muscleat the level of the outer canthus.  - Patient feels her clenching is a trigger for headaches. +5 units masseter bilaterally   - Patient feels the migraines are centered around the eyes +5 units bilaterally at the outer canthus in the orbicularis occuli  -Occipitalis muscle injection, 3 sites, bilaterally. The first injection was done one half way between the occipital protuberance and the tip of the mastoid process behind the ear. The second injection site was done lateral and superior to the first, 1 fingerbreadth from the first injection. The third injection site was 1 fingerbreadth superiorly and medially from the first injection site.  -Cervical paraspinal muscle injection, 2 sites, bilateral knee first injection site was 1 cm from the midline of the cervical spine, 3 cm inferior to the lower border of the occipital protuberance. The second injection site was 1.5 cm superiorly and laterally to the first injection site.  -Trapezius muscle injection was performed at 3 sites, bilaterally. The first injection site was in the upper trapezius muscle halfway between the inflection point of the neck, and the acromion. The second injection site was one half way between the acromion and the first injection  site. The third injection was done between the first injection site and the inflection point of the neck.   Will return for repeat injection in 3 months.   A 200 unit sof Botox was used, any Botox not injected was wasted. The patient tolerated the procedure well, there were no complications of the above procedure.

## 2019-02-15 MED FILL — LOSARTAN POTASSIUM 25 MG TA: 25 | 30 days supply | Qty: 30 | Fill #6

## 2019-03-17 ENCOUNTER — Other Ambulatory Visit: Payer: 59

## 2019-03-18 MED FILL — LOSARTAN POTASSIUM 25 MG TA: 25 | 30 days supply | Qty: 30 | Fill #7

## 2019-04-05 ENCOUNTER — Telehealth: Payer: Self-pay | Admitting: Nurse Practitioner

## 2019-04-05 NOTE — Telephone Encounter (Signed)
Left voicemail for patient to call back RE: Need to reschedule labs cancelled 03/17/19.

## 2019-04-14 ENCOUNTER — Other Ambulatory Visit: Payer: Self-pay

## 2019-04-14 ENCOUNTER — Other Ambulatory Visit (INDEPENDENT_AMBULATORY_CARE_PROVIDER_SITE_OTHER): Payer: 59

## 2019-04-14 DIAGNOSIS — E039 Hypothyroidism, unspecified: Secondary | ICD-10-CM | POA: Diagnosis not present

## 2019-04-14 DIAGNOSIS — E041 Nontoxic single thyroid nodule: Secondary | ICD-10-CM | POA: Diagnosis not present

## 2019-04-14 LAB — T4, FREE: Free T4: 0.7 ng/dL (ref 0.60–1.60)

## 2019-04-14 LAB — T3, FREE: T3, Free: 3.7 pg/mL (ref 2.3–4.2)

## 2019-04-14 LAB — TSH: TSH: 1.8 u[IU]/mL (ref 0.35–4.50)

## 2019-04-14 MED ORDER — THYROID 60 MG PO TABS: ORAL_TABLET | ORAL | 5 refills | Status: DC

## 2019-04-14 MED ORDER — THYROID 90 MG PO TABS: ORAL_TABLET | ORAL | 5 refills | Status: DC

## 2019-04-14 MED FILL — ARMOUR THYROID 60 MG TABLET: 60 | 28 days supply | Qty: 12 | Fill #0

## 2019-04-14 MED FILL — ARMOUR THYROID 90 MG TABLET: 90 | 28 days supply | Qty: 16 | Fill #0

## 2019-04-14 NOTE — Addendum Note (Signed)
Addended by: Wilfred Lacy L on: 04/14/2019 01:53 PM   Modules accepted: Orders

## 2019-04-17 MED FILL — LOSARTAN POTASSIUM 25 MG TA: 25 | 30 days supply | Qty: 30 | Fill #8

## 2019-04-20 ENCOUNTER — Telehealth (INDEPENDENT_AMBULATORY_CARE_PROVIDER_SITE_OTHER): Payer: 59 | Admitting: Nurse Practitioner

## 2019-04-20 ENCOUNTER — Encounter: Payer: Self-pay | Admitting: Nurse Practitioner

## 2019-04-20 ENCOUNTER — Other Ambulatory Visit: Payer: Self-pay

## 2019-04-20 VITALS — Ht 64.0 in | Wt 180.0 lb

## 2019-04-20 DIAGNOSIS — E039 Hypothyroidism, unspecified: Secondary | ICD-10-CM | POA: Diagnosis not present

## 2019-04-20 DIAGNOSIS — E041 Nontoxic single thyroid nodule: Secondary | ICD-10-CM | POA: Diagnosis not present

## 2019-04-20 DIAGNOSIS — L243 Irritant contact dermatitis due to cosmetics: Secondary | ICD-10-CM | POA: Diagnosis not present

## 2019-04-20 MED ORDER — THYROID 60 MG PO TABS: 60.0000 mg | ORAL_TABLET | Freq: Every day | ORAL | 0 refills | Status: DC

## 2019-04-20 MED ORDER — DIPHENHYDRAMINE HCL 25 MG PO TABS: 25.0000 mg | ORAL_TABLET | Freq: Every evening | ORAL | 0 refills | Status: DC | PRN

## 2019-04-20 MED ORDER — ALOE VERA 99 % EX GEL: 1.0000 "application " | Freq: Three times a day (TID) | CUTANEOUS | 0 refills | Status: DC

## 2019-04-20 NOTE — Progress Notes (Signed)
Virtual Visit via Video Note  I connected with@ on 04/20/19 at 10:00 AM EDT by a video enabled telemedicine application and verified that I am speaking with the correct person using two identifiers.  Location: Patient:Home Provider: Office Participants: patient and provider  I discussed the limitations of evaluation and management by telemedicine and the availability of in person appointments. I also discussed with the patient that there may be a patient responsible charge related to this service. The patient expressed understanding and agreed to proceed.  [id number] burn due to cosmetic product. Discuss armour thyroid dose.  History of Present Illness: Ms. Woodhead combined facial products on Monday which led to skin irritation: redness and burning sensation. She denies any contact with her eyes. She also reports taking on 60mg  of armour thyroid instead of alternating between 90mg  and 60mg . She needs medication refilled.  Observations/Objective: Physical Exam  Constitutional: She is oriented to person, place, and time. No distress.  HENT:  Head:    Right Ear: External ear normal.  Left Ear: External ear normal.  Eyes: Conjunctivae and EOM are normal.  Pulmonary/Chest: Effort normal.  Musculoskeletal:     Cervical back: Normal range of motion and neck supple.  Neurological: She is alert and oriented to person, place, and time.  Skin: Rash noted. Rash is macular. There is erythema.   Assessment and Plan: Raine was seen today for rash.  Diagnoses and all orders for this visit:  Irritant contact dermatitis due to cosmetics -     Aloe Vera 99 % GEL; Apply 1 application topically 3 (three) times daily. -     diphenhydrAMINE (BENADRYL ALLERGY) 25 MG tablet; Take 1 tablet (25 mg total) by mouth at bedtime as needed for itching.  Hypothyroidism, unspecified type -     thyroid (ARMOUR) 60 MG tablet; Take 1 tablet (60 mg total) by mouth daily before breakfast. -     TSH; Future -      T4, free; Future -     T3, free; Future  Thyroid nodule -     thyroid (ARMOUR) 60 MG tablet; Take 1 tablet (60 mg total) by mouth daily before breakfast. -     TSH; Future -     T4, free; Future -     T3, free; Future   Follow Up Instructions: See avs   I discussed the assessment and treatment plan with the patient. The patient was provided an opportunity to ask questions and all were answered. The patient agreed with the plan and demonstrated an understanding of the instructions.   The patient was advised to call back or seek an in-person evaluation if the symptoms worsen or if the condition fails to improve as anticipated.   Wilfred Lacy, NP

## 2019-04-20 NOTE — Patient Instructions (Addendum)
Call office if no improvement in 3-5days. Go to lab for repeat Thyroid function in 6weeks  Contact Dermatitis Dermatitis is redness, soreness, and swelling (inflammation) of the skin. Contact dermatitis is a reaction to something that touches the skin. There are two types of contact dermatitis:  Irritant contact dermatitis. This happens when something bothers (irritates) your skin, like soap.  Allergic contact dermatitis. This is caused when you are exposed to something that you are allergic to, such as poison ivy. What are the causes?  Common causes of irritant contact dermatitis include: ? Makeup. ? Soaps. ? Detergents. ? Bleaches. ? Acids. ? Metals, such as nickel.  Common causes of allergic contact dermatitis include: ? Plants. ? Chemicals. ? Jewelry. ? Latex. ? Medicines. ? Preservatives in products, such as clothing. What increases the risk?  Having a job that exposes you to things that bother your skin.  Having asthma or eczema. What are the signs or symptoms? Symptoms may happen anywhere the irritant has touched your skin. Symptoms include:  Dry or flaky skin.  Redness.  Cracks.  Itching.  Pain or a burning feeling.  Blisters.  Blood or clear fluid draining from skin cracks. With allergic contact dermatitis, swelling may occur. This may happen in places such as the eyelids, mouth, or genitals. How is this treated?  This condition is treated by checking for the cause of the reaction and protecting your skin. Treatment may also include: ? Steroid creams, ointments, or medicines. ? Antibiotic medicines or other ointments, if you have a skin infection. ? Lotion or medicines to help with itching. ? A bandage (dressing). Follow these instructions at home: Skin care  Moisturize your skin as needed.  Put cool cloths on your skin.  Put a baking soda paste on your skin. Stir water into baking soda until it looks like a paste.  Do not scratch your  skin.  Avoid having things rub up against your skin.  Avoid the use of soaps, perfumes, and dyes. Medicines  Take or apply over-the-counter and prescription medicines only as told by your doctor.  If you were prescribed an antibiotic medicine, take or apply it as told by your doctor. Do not stop using it even if your condition starts to get better. Bathing  Take a bath with: ? Epsom salts. ? Baking soda. ? Colloidal oatmeal.  Bathe less often.  Bathe in warm water. Avoid using hot water. Bandage care  If you were given a bandage, change it as told by your health care provider.  Wash your hands with soap and water before and after you change your bandage. If soap and water are not available, use hand sanitizer. General instructions  Avoid the things that caused your reaction. If you do not know what caused it, keep a journal. Write down: ? What you eat. ? What skin products you use. ? What you drink. ? What you wear in the area that has symptoms. This includes jewelry.  Check the affected areas every day for signs of infection. Check for: ? More redness, swelling, or pain. ? More fluid or blood. ? Warmth. ? Pus or a bad smell.  Keep all follow-up visits as told by your doctor. This is important. Contact a doctor if:  You do not get better with treatment.  Your condition gets worse.  You have signs of infection, such as: ? More swelling. ? Tenderness. ? More redness. ? Soreness. ? Warmth.  You have a fever.  You have new symptoms.  Get help right away if:  You have a very bad headache.  You have neck pain.  Your neck is stiff.  You throw up (vomit).  You feel very sleepy.  You see red streaks coming from the area.  Your bone or joint near the area hurts after the skin has healed.  The area turns darker.  You have trouble breathing. Summary  Dermatitis is redness, soreness, and swelling of the skin.  Symptoms may occur where the irritant has  touched you.  Treatment may include medicines and skin care.  If you do not know what caused your reaction, keep a journal.  Contact a doctor if your condition gets worse or you have signs of infection. This information is not intended to replace advice given to you by your health care provider. Make sure you discuss any questions you have with your health care provider. Document Revised: 05/04/2018 Document Reviewed: 07/28/2017 Elsevier Patient Education  [place].

## 2019-04-25 ENCOUNTER — Ambulatory Visit: Payer: Self-pay | Admitting: Neurology

## 2019-04-25 ENCOUNTER — Telehealth: Payer: Self-pay | Admitting: *Deleted

## 2019-04-25 NOTE — Telephone Encounter (Signed)
I called [facility] [phone] and spoke to [place].  He states that [phone] and [phone] are billable and do not require PA.  Ref# for this call is [phone]

## 2019-04-26 ENCOUNTER — Ambulatory Visit: Payer: 59 | Admitting: Neurology

## 2019-05-02 ENCOUNTER — Ambulatory Visit: Payer: 59 | Admitting: Neurology

## 2019-05-02 ENCOUNTER — Other Ambulatory Visit: Payer: Self-pay

## 2019-05-02 VITALS — Temp 98.4°F

## 2019-05-02 DIAGNOSIS — G43709 Chronic migraine without aura, not intractable, without status migrainosus: Secondary | ICD-10-CM

## 2019-05-02 DIAGNOSIS — D1802 Hemangioma of intracranial structures: Secondary | ICD-10-CM

## 2019-05-02 MED FILL — ARMOUR THYROID 60 MG TABLET: 60 | 90 days supply | Qty: 90 | Fill #0

## 2019-05-02 NOTE — Progress Notes (Signed)
Botox- 100 units x 2 vials Lot: [id number] Expiration: 12/2021 NDC: [id number]  Bacteriostatic 0.9% Sodium Chloride- 75mL total Lot: [id number] Expiration: 07/27/2019 NDC: [id number]  Dx: [id number] B/B

## 2019-05-02 NOTE — Progress Notes (Signed)
Consent Form Botulism Toxin Injection For Chronic Migraine  This is our third botox. She did great again, she noticed it wearing off again in about 10 weeks and migraines returning. The masseters helped but I changed her smile so will not do that again. Around the forehead it was better. This time was even better than last time, she had > 50% decrease in frequency. She has a Acupuncturist and that helps with the grinding. Do the forehead high up in the forehead. No masseters. She has more occipital pain at the emergence of the occipital nerve especially on the right. +temple due to he clenching. Patient also wants to discuss removal of cavernous hemangioma, will refer to Dr. Zada Finders.   Reviewed orally with patient, additionally signature is on file:  Botulism toxin has been approved by the Federal drug administration for treatment of chronic migraine. Botulism toxin does not cure chronic migraine and it may not be effective in some patients.  The administration of botulism toxin is accomplished by injecting a small amount of toxin into the muscles of the neck and head. Dosage must be titrated for each individual. Any benefits resulting from botulism toxin tend to wear off after 3 months with a repeat injection required if benefit is to be maintained. Injections are usually done every 3-4 months with maximum effect peak achieved by about 2 or 3 weeks. Botulism toxin is expensive and you should be sure of what costs you will incur resulting from the injection.  The side effects of botulism toxin use for chronic migraine may include:   -Transient, and usually mild, facial weakness with facial injections  -Transient, and usually mild, head or neck weakness with head/neck injections  -Reduction or loss of forehead facial animation due to forehead muscle weakness  -Eyelid drooping  -Dry eye  -Pain at the site of injection or bruising at the site of injection  -Double vision  -Potential unknown long  term risks  Contraindications: You should not have Botox if you are pregnant, nursing, allergic to albumin, have an infection, skin condition, or muscle weakness at the site of the injection, or have myasthenia gravis, Lambert-Eaton syndrome, or ALS.  It is also possible that as with any injection, there may be an allergic reaction or no effect from the medication. Reduced effectiveness after repeated injections is sometimes seen and rarely infection at the injection site may occur. All care will be taken to prevent these side effects. If therapy is given over a long time, atrophy and wasting in the muscle injected may occur. Occasionally the patient's become refractory to treatment because they develop antibodies to the toxin. In this event, therapy needs to be modified.  I have read the above information and consent to the administration of botulism toxin.    BOTOX PROCEDURE NOTE FOR MIGRAINE HEADACHE    Contraindications and precautions discussed with patient(above). Aseptic procedure was observed and patient tolerated procedure. Procedure performed by Dr. Georgia Dom  The condition has existed for more than 6 months, and pt does not have a diagnosis of ALS, Myasthenia Gravis or Lambert-Eaton Syndrome.  Risks and benefits of injections discussed and pt agrees to proceed with the procedure.  Written consent obtained  These injections are medically necessary. Pt  receives good benefits from these injections. These injections do not cause sedations or hallucinations which the oral therapies may cause.  Description of procedure:  The patient was placed in a sitting position. The standard protocol was used for Botox as follows,  with 5 units of Botox injected at each site:   -Procerus muscle, midline injection  -Corrugator muscle, bilateral injection  -Frontalis muscle, bilateral injection, with 2 sites each side, medial injection was performed in the upper one third of the frontalis muscle,  in the region vertical from the medial inferior edge of the superior orbital rim. The lateral injection was again in the upper one third of the forehead vertically above the lateral limbus of the cornea, 1.5 cm lateral to the medial injection site.  - Levator Scapulae: 5 units bilaterally  -Temporalis muscle injection, 5 sites, bilaterally. The first injection was 3 cm above the tragus of the ear, second injection site was 1.5 cm to 3 cm up from the first injection site in line with the tragus of the ear. The third injection site was 1.5-3 cm forward between the first 2 injection sites. The fourth injection site was 1.5 cm posterior to the second injection site. 5th site laterally in the temporalis  muscleat the level of the outer canthus.  - Patient feels her clenching is a trigger for headaches. +5 units masseter bilaterally   - Patient feels the migraines are centered around the eyes +5 units bilaterally at the outer canthus in the orbicularis occuli  -Occipitalis muscle injection, 3 sites, bilaterally. The first injection was done one half way between the occipital protuberance and the tip of the mastoid process behind the ear. The second injection site was done lateral and superior to the first, 1 fingerbreadth from the first injection. The third injection site was 1 fingerbreadth superiorly and medially from the first injection site.  -Cervical paraspinal muscle injection, 2 sites, bilateral knee first injection site was 1 cm from the midline of the cervical spine, 3 cm inferior to the lower border of the occipital protuberance. The second injection site was 1.5 cm superiorly and laterally to the first injection site.  -Trapezius muscle injection was performed at 3 sites, bilaterally. The first injection site was in the upper trapezius muscle halfway between the inflection point of the neck, and the acromion. The second injection site was one half way between the acromion and the first injection  site. The third injection was done between the first injection site and the inflection point of the neck.   Will return for repeat injection in 3 months.   A 200 unit sof Botox was used, any Botox not injected was wasted. The patient tolerated the procedure well, there were no complications of the above procedure.

## 2019-05-05 ENCOUNTER — Telehealth: Payer: Self-pay | Admitting: Nurse Practitioner

## 2019-05-05 MED ORDER — THYROID 90 MG PO TABS: 90.0000 mg | ORAL_TABLET | Freq: Every day | ORAL | 0 refills | Status: DC

## 2019-05-05 NOTE — Telephone Encounter (Signed)
Rx sent 

## 2019-05-05 NOTE — Telephone Encounter (Signed)
Ok to send 90tabs

## 2019-05-05 NOTE — Telephone Encounter (Signed)
Yes take 90 and 60 as originally prescribed

## 2019-05-05 NOTE — Telephone Encounter (Signed)
Pt called stating that she has been on Armour thyroid 60 mg for 2 weeks now and it made her tired easily, no energy, muscle pain when work out.   Pt was wondering if charlotte want he to stay on it for maybe 1 wk or change it to 60 mg once day and 90 mg another day.   Please advise

## 2019-05-05 NOTE — Telephone Encounter (Signed)
Patient is calling and requesting a call back regarding medication. CB is [phone]

## 2019-05-05 NOTE — Telephone Encounter (Signed)
Pt is aware of message below.   Pt was wondering if Baldo Ash can send in some more 90 mg to [facility] just pick up the 60 mg the other day.   Please advise.   Correction: 90mg  (M, W, F, Sun) and 60mg  tabs (T, Th, Sat) Inform patient about the absence of tablet to constitute exactly 81.25mg .

## 2019-05-15 MED FILL — LOSARTAN POTASSIUM 25 MG TA: 25 | 90 days supply | Qty: 90 | Fill #9

## 2019-05-18 DIAGNOSIS — Q283 Other malformations of cerebral vessels: Secondary | ICD-10-CM | POA: Insufficient documentation

## 2019-05-18 DIAGNOSIS — I10 Essential (primary) hypertension: Secondary | ICD-10-CM | POA: Diagnosis not present

## 2019-05-18 DIAGNOSIS — Z6831 Body mass index (BMI) 31.0-31.9, adult: Secondary | ICD-10-CM | POA: Diagnosis not present

## 2019-05-31 ENCOUNTER — Telehealth: Payer: Self-pay | Admitting: Neurology

## 2019-05-31 ENCOUNTER — Encounter: Payer: Self-pay | Admitting: *Deleted

## 2019-05-31 NOTE — Telephone Encounter (Signed)
Letter written, pending MD signature.  

## 2019-05-31 NOTE — Telephone Encounter (Signed)
Please do and thank you for writing it!

## 2019-05-31 NOTE — Telephone Encounter (Signed)
Letter signed by Dr. Jaynee Eagles. I called pt & LVM (ok per DPR) advising her the letter has been written and is being placed up front for pickup. I left office hours in message. I also let her know the letter should be viewable from mychart. Left office number for call back if needed.

## 2019-05-31 NOTE — Telephone Encounter (Signed)
Pt called wanting to know if she can get a letter for work requesting to get another monitor so that she does not have to look down which is what is causing her headaches. Please advise.

## 2019-06-02 ENCOUNTER — Encounter: Payer: Self-pay | Admitting: Nurse Practitioner

## 2019-06-02 ENCOUNTER — Other Ambulatory Visit: Payer: Self-pay

## 2019-06-02 ENCOUNTER — Other Ambulatory Visit (INDEPENDENT_AMBULATORY_CARE_PROVIDER_SITE_OTHER): Payer: 59

## 2019-06-02 DIAGNOSIS — E039 Hypothyroidism, unspecified: Secondary | ICD-10-CM | POA: Diagnosis not present

## 2019-06-02 DIAGNOSIS — E041 Nontoxic single thyroid nodule: Secondary | ICD-10-CM

## 2019-06-02 LAB — T3, FREE: T3, Free: 4.6 pg/mL — ABNORMAL HIGH (ref 2.3–4.2)

## 2019-06-02 LAB — T4, FREE: Free T4: 0.64 ng/dL (ref 0.60–1.60)

## 2019-06-02 LAB — TSH: TSH: 1.11 u[IU]/mL (ref 0.35–4.50)

## 2019-06-12 MED FILL — ARMOUR THYROID 90 MG TABLET: 90 | 84 days supply | Qty: 48 | Fill #0

## 2019-07-25 ENCOUNTER — Telehealth: Payer: Self-pay | Admitting: Nurse Practitioner

## 2019-07-25 DIAGNOSIS — E039 Hypothyroidism, unspecified: Secondary | ICD-10-CM

## 2019-07-25 NOTE — Telephone Encounter (Signed)
Ok to enter referral 

## 2019-07-25 NOTE — Telephone Encounter (Signed)
Charlotte please advise.  Pt is asking for a referral to Dr. Hartford Poli at [facility] on [address]. Pt said this was talked about at her last visit on her thyroid.

## 2019-07-25 NOTE — Telephone Encounter (Signed)
Referral entered and pt was notified of referral sent.

## 2019-07-25 NOTE — Telephone Encounter (Signed)
Patient is calling and wanted to speak to see if Baldo Ash can get a referral to Dr. [facility] on [address], please advise [phone]. Ok to leave message or leave a Therapist, music. CB is [phone]

## 2019-08-07 NOTE — Progress Notes (Signed)
Consent Form Botulism Toxin Injection For Chronic Migraine  08/08/2019: She is still doing great. But it wore off, wasn;t as effective higher up in the forehead but be careful for forehead drooping we did it 3/4 up the forehead instead. +temporals. NO masseters.   Reviewed orally with patient, additionally signature is on file:  Botulism toxin has been approved by the Federal drug administration for treatment of chronic migraine. Botulism toxin does not cure chronic migraine and it may not be effective in some patients.  The administration of botulism toxin is accomplished by injecting a small amount of toxin into the muscles of the neck and head. Dosage must be titrated for each individual. Any benefits resulting from botulism toxin tend to wear off after 3 months with a repeat injection required if benefit is to be maintained. Injections are usually done every 3-4 months with maximum effect peak achieved by about 2 or 3 weeks. Botulism toxin is expensive and you should be sure of what costs you will incur resulting from the injection.  The side effects of botulism toxin use for chronic migraine may include:   -Transient, and usually mild, facial weakness with facial injections  -Transient, and usually mild, head or neck weakness with head/neck injections  -Reduction or loss of forehead facial animation due to forehead muscle weakness  -Eyelid drooping  -Dry eye  -Pain at the site of injection or bruising at the site of injection  -Double vision  -Potential unknown long term risks  Contraindications: You should not have Botox if you are pregnant, nursing, allergic to albumin, have an infection, skin condition, or muscle weakness at the site of the injection, or have myasthenia gravis, Lambert-Eaton syndrome, or ALS.  It is also possible that as with any injection, there may be an allergic reaction or no effect from the medication. Reduced effectiveness after repeated injections is  sometimes seen and rarely infection at the injection site may occur. All care will be taken to prevent these side effects. If therapy is given over a long time, atrophy and wasting in the muscle injected may occur. Occasionally the patient's become refractory to treatment because they develop antibodies to the toxin. In this event, therapy needs to be modified.  I have read the above information and consent to the administration of botulism toxin.    BOTOX PROCEDURE NOTE FOR MIGRAINE HEADACHE    Contraindications and precautions discussed with patient(above). Aseptic procedure was observed and patient tolerated procedure. Procedure performed by Dr. Georgia Dom  The condition has existed for more than 6 months, and pt does not have a diagnosis of ALS, Myasthenia Gravis or Lambert-Eaton Syndrome.  Risks and benefits of injections discussed and pt agrees to proceed with the procedure.  Written consent obtained  These injections are medically necessary. Pt  receives good benefits from these injections. These injections do not cause sedations or hallucinations which the oral therapies may cause.  Description of procedure:  The patient was placed in a sitting position. The standard protocol was used for Botox as follows, with 5 units of Botox injected at each site:   -Procerus muscle, midline injection  -Corrugator muscle, bilateral injection  -Frontalis muscle, bilateral injection, with 2 sites each side, medial injection was performed in the upper one third of the frontalis muscle, in the region vertical from the medial inferior edge of the superior orbital rim. The lateral injection was again in the upper one third of the forehead vertically above the lateral limbus of the  cornea, 1.5 cm lateral to the medial injection site.  - Levator Scapulae: 5 units bilaterally  -Temporalis muscle injection, 5 sites, bilaterally. The first injection was 3 cm above the tragus of the ear, second injection  site was 1.5 cm to 3 cm up from the first injection site in line with the tragus of the ear. The third injection site was 1.5-3 cm forward between the first 2 injection sites. The fourth injection site was 1.5 cm posterior to the second injection site. 5th site laterally in the temporalis  muscleat the level of the outer canthus.  - Patient feels her clenching is a trigger for headaches. +5 units masseter bilaterally   - Patient feels the migraines are centered around the eyes +5 units bilaterally at the outer canthus in the orbicularis occuli  -Occipitalis muscle injection, 3 sites, bilaterally. The first injection was done one half way between the occipital protuberance and the tip of the mastoid process behind the ear. The second injection site was done lateral and superior to the first, 1 fingerbreadth from the first injection. The third injection site was 1 fingerbreadth superiorly and medially from the first injection site.  -Cervical paraspinal muscle injection, 2 sites, bilateral knee first injection site was 1 cm from the midline of the cervical spine, 3 cm inferior to the lower border of the occipital protuberance. The second injection site was 1.5 cm superiorly and laterally to the first injection site.  -Trapezius muscle injection was performed at 3 sites, bilaterally. The first injection site was in the upper trapezius muscle halfway between the inflection point of the neck, and the acromion. The second injection site was one half way between the acromion and the first injection site. The third injection was done between the first injection site and the inflection point of the neck.   Will return for repeat injection in 3 months.   A 200 unit sof Botox was used, any Botox not injected was wasted. The patient tolerated the procedure well, there were no complications of the above procedure.

## 2019-08-08 ENCOUNTER — Ambulatory Visit: Payer: 59 | Admitting: Neurology

## 2019-08-08 DIAGNOSIS — G43709 Chronic migraine without aura, not intractable, without status migrainosus: Secondary | ICD-10-CM

## 2019-08-08 NOTE — Progress Notes (Signed)
Botox- 200 units x 1 vial Lot: [id number] Expiration: 04/2022 NDC: 0023-3921-02  Bacteriostatic 0.9% Sodium Chloride- 4mL total Lot: [id number] Expiration: 10/26/2020 NDC: 0409-1966-02  Dx: G43.709 B/B  

## 2019-08-16 ENCOUNTER — Other Ambulatory Visit: Payer: Self-pay | Admitting: Nurse Practitioner

## 2019-08-16 DIAGNOSIS — I10 Essential (primary) hypertension: Secondary | ICD-10-CM

## 2019-08-16 MED FILL — LOSARTAN POTASSIUM 25 MG TA: 25 | 90 days supply | Qty: 90 | Fill #0

## 2019-08-20 ENCOUNTER — Encounter: Payer: Self-pay | Admitting: Nurse Practitioner

## 2019-09-05 DIAGNOSIS — D235 Other benign neoplasm of skin of trunk: Secondary | ICD-10-CM | POA: Diagnosis not present

## 2019-09-05 DIAGNOSIS — D2272 Melanocytic nevi of left lower limb, including hip: Secondary | ICD-10-CM | POA: Diagnosis not present

## 2019-09-05 DIAGNOSIS — L700 Acne vulgaris: Secondary | ICD-10-CM | POA: Diagnosis not present

## 2019-09-05 DIAGNOSIS — L564 Polymorphous light eruption: Secondary | ICD-10-CM | POA: Diagnosis not present

## 2019-09-05 DIAGNOSIS — L235 Allergic contact dermatitis due to other chemical products: Secondary | ICD-10-CM | POA: Diagnosis not present

## 2019-09-14 DIAGNOSIS — E063 Autoimmune thyroiditis: Secondary | ICD-10-CM | POA: Diagnosis not present

## 2019-09-14 DIAGNOSIS — E010 Iodine-deficiency related diffuse (endemic) goiter: Secondary | ICD-10-CM | POA: Diagnosis not present

## 2019-09-14 LAB — TSH: TSH: 4.68 (ref <=5.90)

## 2019-09-15 ENCOUNTER — Other Ambulatory Visit (HOSPITAL_COMMUNITY): Payer: Self-pay | Admitting: Endocrinology

## 2019-09-15 MED FILL — ARMOUR THYROID 60 MG TABLET: 60 | 30 days supply | Qty: 30 | Fill #0

## 2019-09-15 MED FILL — ARMOUR THYROID 15 MG TABLET: 15 | 30 days supply | Qty: 30 | Fill #0

## 2019-09-21 ENCOUNTER — Ambulatory Visit: Payer: 59 | Admitting: Obstetrics and Gynecology

## 2019-09-21 NOTE — Progress Notes (Deleted)
44 y.o. [id number] Married Hispanic female here for annual exam.    PCP:     No LMP recorded.           Sexually active: {yes no:314532}  The current method of family planning is vasectomy.    Exercising: {yes no:314532}  {types:19826} Smoker:  no  Health Maintenance: Pap: 07-12-17 Neg:Neg HR HPV,03-13-15 Neg:Neg HR HPV, 02-27-13 Neg:Neg HR HPV History of abnormal Pap:  Yes, 2007 Hx colposcopy and LEEP procedure--CIN 2 with Pos. HR HPV MMG:  ***08-18-18 Neg/density C/Birads1 Colonoscopy: 01-21-18 Mod.diverticulosis:next 10 yrs BMD:   n/a  Result  n/a TDaP:  ***2011 Gardasil:   no HIV: Neg in pregnancy Hep C: Neg in pregnancy Screening Labs:  Hb today: ***, Urine today: ***   reports that she has never smoked. She has never used smokeless tobacco. She reports current alcohol use of about 10.0 - 14.0 standard drinks of alcohol per week. She reports that she does not use drugs.  Past Medical History:  Diagnosis Date  . Abnormal Pap smear of cervix 2007   CIN 2 + HRHPV/ LEEP  . Allergy   . Anemia   . Anxiety 2012  . Complication of anesthesia    bp dropped with epidural  . Contraception    husband vasectomy  . Diverticulosis   . Dysmenorrhea   . GERD (gastroesophageal reflux disease)   . Hashimoto's thyroiditis   . Heart murmur    as a child  . Hemangioma, intracranial structures ([place]) 2013   Left Frontal Lobe  . Hepatitis A   . Hypertension   . Hypothyroid    right thyroid nodule  . Migraines   . Panic disorder 2012  . Sinus tachycardia 2012  . Vertigo     Past Surgical History:  Procedure Laterality Date  . CERVICAL BIOPSY  W/ LOOP ELECTRODE EXCISION  2007   CIN 2  . COLPOSCOPY  2007   CIN 2/ HRHPV  . DILATION AND CURETTAGE OF UTERUS    . SEPTOPLASTY    . TONSILLECTOMY AND ADENOIDECTOMY  1988    Current Outpatient Medications  Medication Sig Dispense Refill  . Aloe Vera 99 % GEL Apply 1 application topically 3 (three) times daily.  0  . cetirizine (ZYRTEC)  10 MG tablet Take 10 mg by mouth as needed for allergies.    . diphenhydrAMINE (BENADRYL ALLERGY) 25 MG tablet Take 1 tablet (25 mg total) by mouth at bedtime as needed for itching. 30 tablet 0  . diphenhydrAMINE-Zinc Acetate (BENADRYL ITCH RELIEF STICK) 2-0.1 % STCK Apply 1 application topically every 8 (eight) hours as needed.    . fluticasone (FLONASE) 50 MCG/ACT nasal spray Place into both nostrils daily as needed.     . loratadine (CLARITIN) 5 MG chewable tablet Chew 5 mg by mouth as needed.     Marland Kitchen losartan (COZAAR) 25 MG tablet Take 1 tablet (25 mg total) by mouth daily. Need office visit for additional refills 90 tablet 0  . Multiple Vitamin (MULTIVITAMIN) tablet Take 1 tablet by mouth every other day.     . nystatin (MYCOSTATIN/NYSTOP) powder Apply topically 3 (three) times daily. Apply to affected area for up to 7 days 15 g 2  . thyroid (ARMOUR THYROID) 90 MG tablet Take 1 tablet (90 mg total) by mouth daily. Monday, Wednesday, Friday and Sunday. 90 tablet 0  . thyroid (ARMOUR) 60 MG tablet Take 1 tablet (60 mg total) by mouth daily before breakfast. 90 tablet 0  .  vitamin C (ASCORBIC ACID) 500 MG tablet Take 500 mg by mouth daily.     Current Facility-Administered Medications  Medication Dose Route Frequency Provider Last Rate Last Admin  . 0.9 %  sodium chloride infusion  500 mL Intravenous Once Nandigam, Venia Minks, MD        Family History  Problem Relation Age of Onset  . Prostate cancer Father 43       Metastatic CA to bones  . Hypertension Father   . Alcohol abuse Father   . Heart disease Father   . Cancer Father        Prostate  . Hyperlipidemia Mother   . Alcohol abuse Paternal Grandfather   . Heart disease Paternal Grandfather   . Dementia Maternal Grandmother   . Heart disease Maternal Grandfather   . Heart disease Paternal Grandmother   . Hypertension Brother   . Breast cancer Maternal Aunt   . Depression Maternal Aunt   . Diabetes Maternal Aunt   . Heart attack  Maternal Uncle   . Heart disease Paternal Aunt   . Depression Maternal Aunt   . Diabetes Maternal Aunt   . Heart attack Paternal Uncle   . Migraines Neg Hx   . Colon cancer Neg Hx   . Esophageal cancer Neg Hx   . Rectal cancer Neg Hx   . Stomach cancer Neg Hx     Review of Systems  Exam:   There were no vitals taken for this visit.    General appearance: alert, cooperative and appears stated age Head: normocephalic, without obvious abnormality, atraumatic Neck: no adenopathy, supple, symmetrical, trachea midline and thyroid normal to inspection and palpation Lungs: clear to auscultation bilaterally Breasts: normal appearance, no masses or tenderness, No nipple retraction or dimpling, No nipple discharge or bleeding, No axillary adenopathy Heart: regular rate and rhythm Abdomen: soft, non-tender; no masses, no organomegaly Extremities: extremities normal, atraumatic, no cyanosis or edema Skin: skin color, texture, turgor normal. No rashes or lesions Lymph nodes: cervical, supraclavicular, and axillary nodes normal. Neurologic: grossly normal  Pelvic: External genitalia:  no lesions              No abnormal inguinal nodes palpated.              Urethra:  normal appearing urethra with no masses, tenderness or lesions              Bartholins and Skenes: normal                 Vagina: normal appearing vagina with normal color and discharge, no lesions              Cervix: no lesions              Pap taken: {yes no:314532} Bimanual Exam:  Uterus:  normal size, contour, position, consistency, mobility, non-tender              Adnexa: no mass, fullness, tenderness              Rectal exam: {yes no:314532}.  Confirms.              Anus:  normal sphincter tone, no lesions  Chaperone was present for exam.  Assessment:   Well woman visit with normal exam.   Plan: Mammogram screening discussed. Self breast awareness reviewed. Pap and HR HPV as above. Guidelines for Calcium,  Vitamin D, regular exercise program including cardiovascular and weight bearing exercise.   Follow up annually  and prn.   Additional counseling given.  {yes [id number]. _______ minutes face to face time of which over 50% was spent in counseling.    After visit summary provided.

## 2019-09-25 NOTE — Progress Notes (Signed)
44 y.o. [id number] Married Hispanic female here for annual exam.    Regular menses.  Having intense hot flashes.  Sleeping well.  Declines treatment for this.   Struggling with her weight.   Voiding often.  Drinking a 1/2 gallon of water daily.  Feels like she is emptying well.  Having stress incontinence.   Considering booster vaccine for Covid.   PCP: Wilfred Lacy, NP  Patient's last menstrual period was 09/24/2019 (exact date).     Period Cycle (Days): 30 Period Duration (Days): 4 days Period Pattern: Regular Menstrual Flow: Moderate (extremely heavy day 2 then tapers) Menstrual Control: Maxi pad Menstrual Control Change Freq (Hours): changes pad every 4-6 hours on heaviest day Dysmenorrhea: (!) Mild Dysmenorrhea Symptoms: Cramping, Diarrhea, Headache (back pain)     Sexually active: Yes.    The current method of family planning is vasectomy.    Exercising: Yes.    circuit training and yoga  Smoker:  no  Health Maintenance: Pap:  07-12-17 Neg:Neg HR HPV,03-13-15 Neg:Neg HR HPV, 02-27-13 Neg:Neg HR HPV History of abnormal Pap:  Yes,  2007 Hx colposcopy and LEEP procedure--CIN 2 with Pos. HR HPV MMG:  08-18-18 Neg/density C/Birads1--pt.knows she needs to schedule Colonoscopy:  12/2017 normal--hemorrhoids;next 10 years BMD:   n/a  Result  n/a TDaP: 2011--will do with PCP Gardasil:   no HIV: Neg in pregnancy Hep C:Neg in pregnancy Screening Labs:  PCP.    reports that she has never smoked. She has never used smokeless tobacco. She reports current alcohol use of about 10.0 - 14.0 standard drinks of alcohol per week. She reports that she does not use drugs.  Past Medical History:  Diagnosis Date  . Abnormal Pap smear of cervix 2007   CIN 2 + HRHPV/ LEEP  . Allergy   . Anemia   . Anxiety 2012  . Complication of anesthesia    bp dropped with epidural  . Contraception    husband vasectomy  . Diverticulosis   . Dysmenorrhea   . GERD (gastroesophageal reflux disease)    . Hashimoto's thyroiditis   . Heart murmur    as a child  . Hemangioma, intracranial structures ([place]) 2013   Left Frontal Lobe  . Hepatitis A   . Hypertension   . Hypothyroid    right thyroid nodule  . Migraines   . Panic disorder 2012  . Sinus tachycardia 2012  . Vertigo     Past Surgical History:  Procedure Laterality Date  . CERVICAL BIOPSY  W/ LOOP ELECTRODE EXCISION  2007   CIN 2  . COLPOSCOPY  2007   CIN 2/ HRHPV  . DILATION AND CURETTAGE OF UTERUS    . SEPTOPLASTY    . TONSILLECTOMY AND ADENOIDECTOMY  1988    Current Outpatient Medications  Medication Sig Dispense Refill  . cetirizine (ZYRTEC) 10 MG tablet Take 10 mg by mouth as needed for allergies.    . diphenhydrAMINE (BENADRYL ALLERGY) 25 MG tablet Take 1 tablet (25 mg total) by mouth at bedtime as needed for itching. 30 tablet 0  . fluticasone (FLONASE) 50 MCG/ACT nasal spray Place into both nostrils daily as needed.     . loratadine (CLARITIN) 5 MG chewable tablet Chew 5 mg by mouth as needed.     Marland Kitchen losartan (COZAAR) 25 MG tablet Take 1 tablet (25 mg total) by mouth daily. Need office visit for additional refills 90 tablet 0  . Multiple Vitamin (MULTIVITAMIN) tablet Take 1 tablet by mouth every other day.     Marland Kitchen  nystatin (MYCOSTATIN/NYSTOP) powder Apply topically 3 (three) times daily. Apply to affected area for up to 7 days 15 g 2  . thyroid (ARMOUR) 15 MG tablet Take 1 tablet by mouth daily.    Marland Kitchen thyroid (ARMOUR) 60 MG tablet Take 1 tablet (60 mg total) by mouth daily before breakfast. 90 tablet 0  . vitamin C (ASCORBIC ACID) 500 MG tablet Take 500 mg by mouth daily.     Current Facility-Administered Medications  Medication Dose Route Frequency Provider Last Rate Last Admin  . 0.9 %  sodium chloride infusion  500 mL Intravenous Once Nandigam, Venia Minks, MD        Family History  Problem Relation Age of Onset  . Prostate cancer Father 28       Metastatic CA to bones  . Hypertension Father   . Alcohol  abuse Father   . Heart disease Father   . Cancer Father        Prostate  . Hyperlipidemia Mother   . Alcohol abuse Paternal Grandfather   . Heart disease Paternal Grandfather   . Dementia Maternal Grandmother   . Heart disease Maternal Grandfather   . Heart disease Paternal Grandmother   . Hypertension Brother   . Breast cancer Maternal Aunt   . Depression Maternal Aunt   . Diabetes Maternal Aunt   . Heart attack Maternal Uncle   . Heart disease Paternal Aunt   . Depression Maternal Aunt   . Diabetes Maternal Aunt   . Heart attack Paternal Uncle   . Migraines Neg Hx   . Colon cancer Neg Hx   . Esophageal cancer Neg Hx   . Rectal cancer Neg Hx   . Stomach cancer Neg Hx     Review of Systems  All other systems reviewed and are negative.   Exam:   BP 132/82 (Cuff Size: Large)   Pulse 88   Resp 14   Ht 5\' 4"  (1.626 m)   Wt 189 lb 6.4 oz (85.9 kg)   LMP 09/24/2019 (Exact Date)   BMI 32.51 kg/m     General appearance: alert, cooperative and appears stated age Head: normocephalic, without obvious abnormality, atraumatic Neck: no adenopathy, supple, symmetrical, trachea midline and thyroid normal to inspection and palpation Lungs: clear to auscultation bilaterally Breasts: normal appearance, no masses or tenderness, No nipple retraction or dimpling, No nipple discharge or bleeding, No axillary adenopathy Heart: regular rate and rhythm Abdomen: soft, non-tender; no masses, no organomegaly Extremities: extremities normal, atraumatic, no cyanosis or edema Skin: skin color, texture, turgor normal. No rashes or lesions Lymph nodes: cervical, supraclavicular, and axillary nodes normal. Neurologic: grossly normal  Pelvic: External genitalia:  no lesions              No abnormal inguinal nodes palpated.              Urethra:  normal appearing urethra with no masses, tenderness or lesions              Bartholins and Skenes: normal                 Vagina: normal appearing vagina  with normal color and discharge, no lesions.  Light menstrual flow noted.              Cervix: no lesions              Pap taken: No. Bimanual Exam:  Uterus:  normal size, contour, position, consistency, mobility, non-tender  Adnexa: no mass, fullness, tenderness              Rectal exam: Yes.  .  Confirms.              Anus:  normal sphincter tone, no lesions  Chaperone was present for exam.  Assessment:   Well woman visit with normal exam. Hx LEEP. Stress incontinence.  Urinary urgency.  Migraine HA.  Intracranial hemangioma  Weight gain.   Plan: Mammogram screening discussed. Pap and HR HPV 2022. Guidelines for Calcium, Vitamin D, regular exercise program including cardiovascular and weight bearing exercise. Timed voiding reviewed.  I mentioned physician based weight loss programs. Follow up annually and prn.   After visit summary provided.

## 2019-09-26 ENCOUNTER — Ambulatory Visit: Payer: 59 | Admitting: Obstetrics and Gynecology

## 2019-09-26 ENCOUNTER — Encounter: Payer: Self-pay | Admitting: Obstetrics and Gynecology

## 2019-09-26 ENCOUNTER — Other Ambulatory Visit: Payer: Self-pay

## 2019-09-26 VITALS — BP 132/82 | HR 88 | Resp 14 | Ht 64.0 in | Wt 189.4 lb

## 2019-09-26 DIAGNOSIS — Z01419 Encounter for gynecological examination (general) (routine) without abnormal findings: Secondary | ICD-10-CM

## 2019-09-26 NOTE — Patient Instructions (Signed)

## 2019-10-05 IMAGING — MG DIGITAL SCREENING BILATERAL MAMMOGRAM WITH CAD
4 series · 4 of 4 positions shown · non-contrast
Comparison: Previous exam(s).

CLINICAL DATA: Screening.

EXAM:
DIGITAL SCREENING BILATERAL MAMMOGRAM WITH CAD

[R CC]
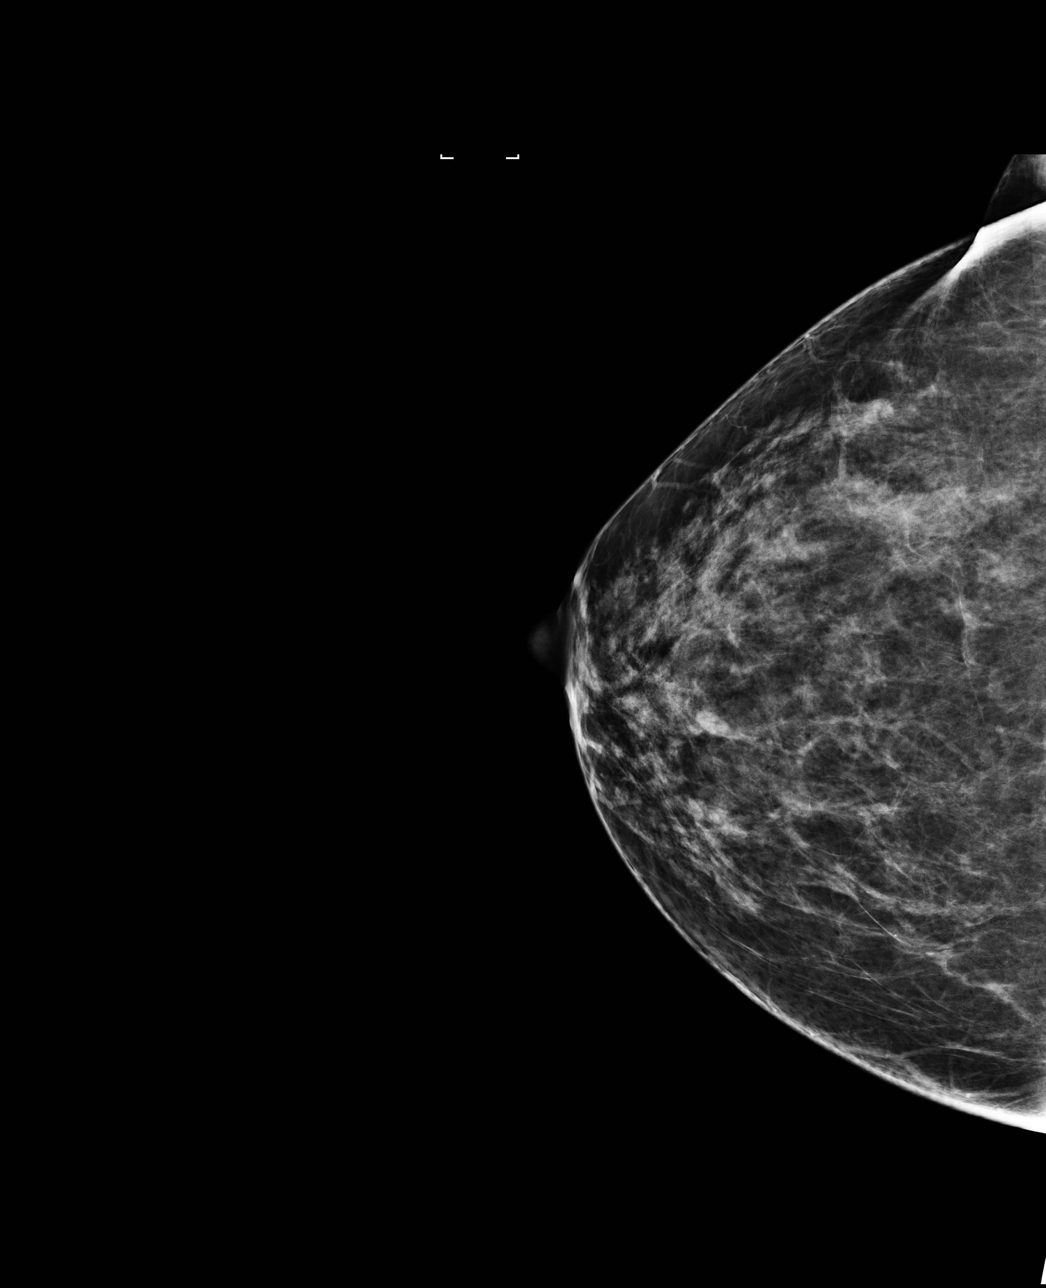

[L CC]
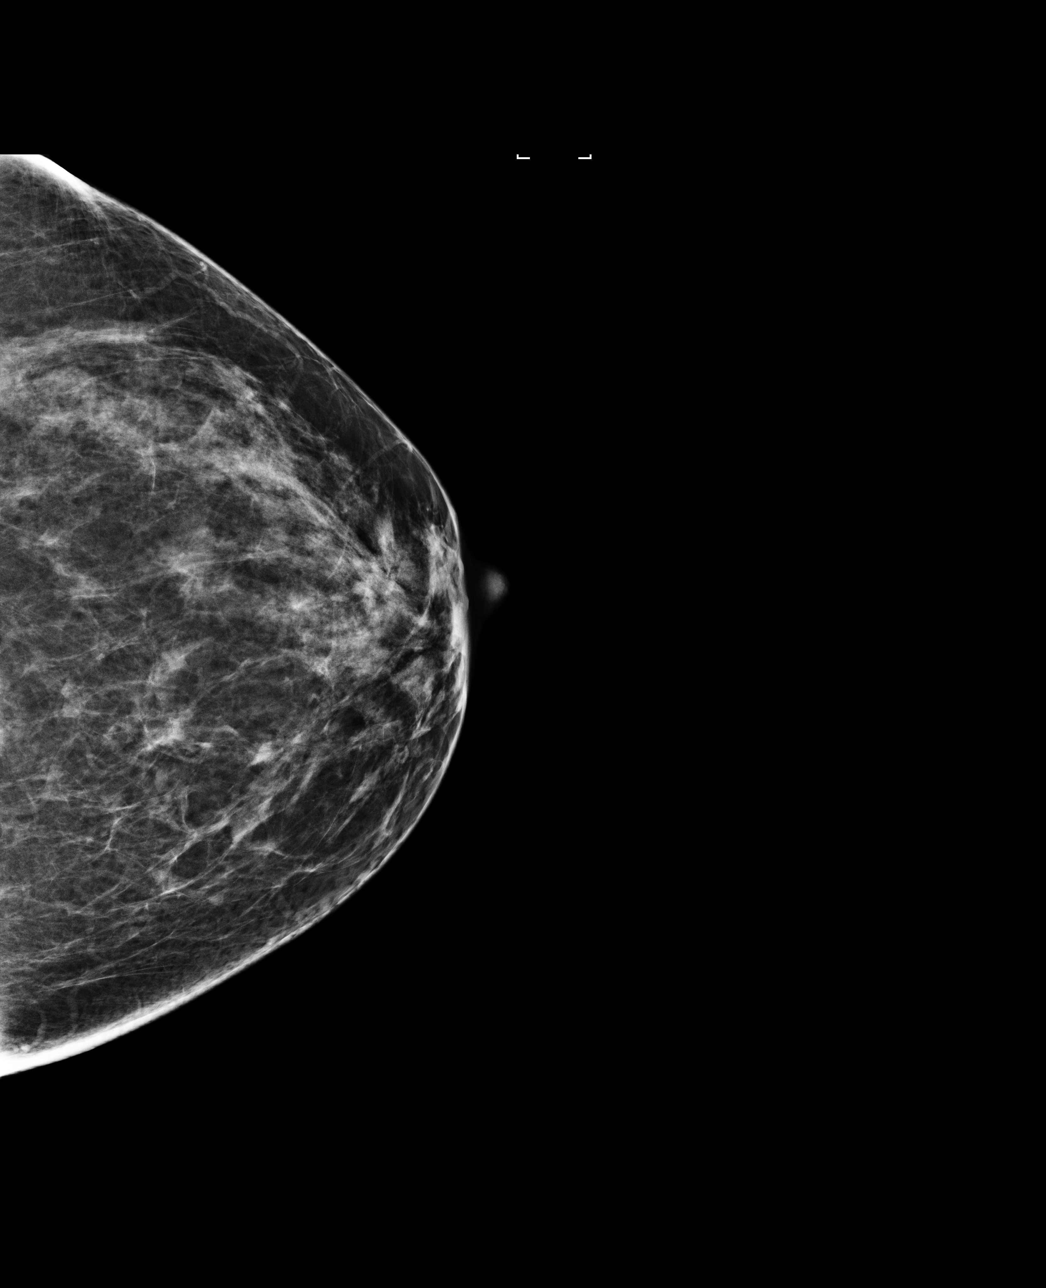

[L MLO]
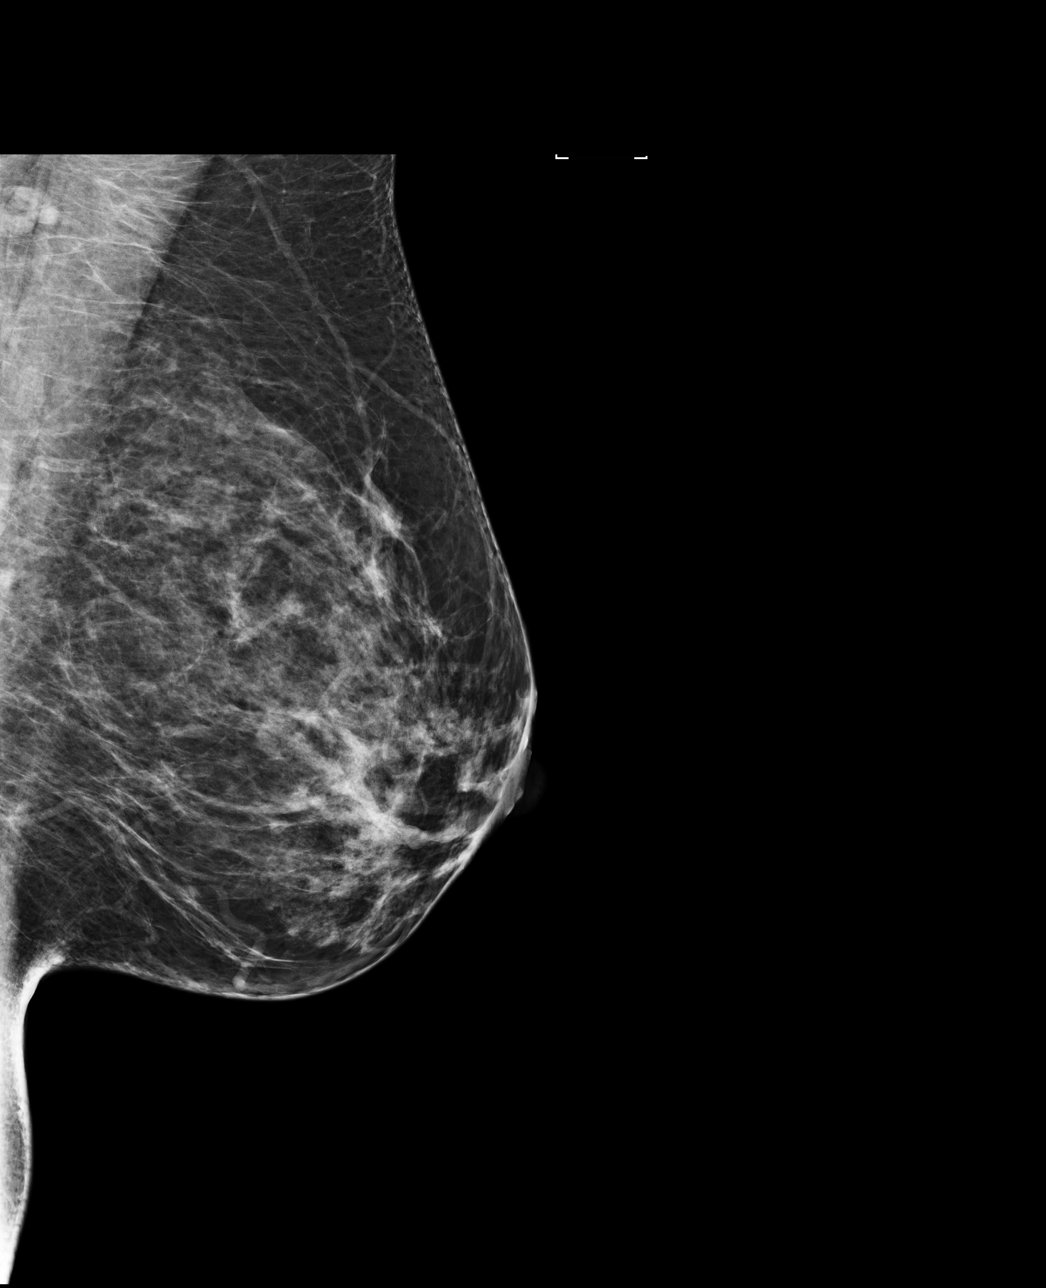

[R MLO]
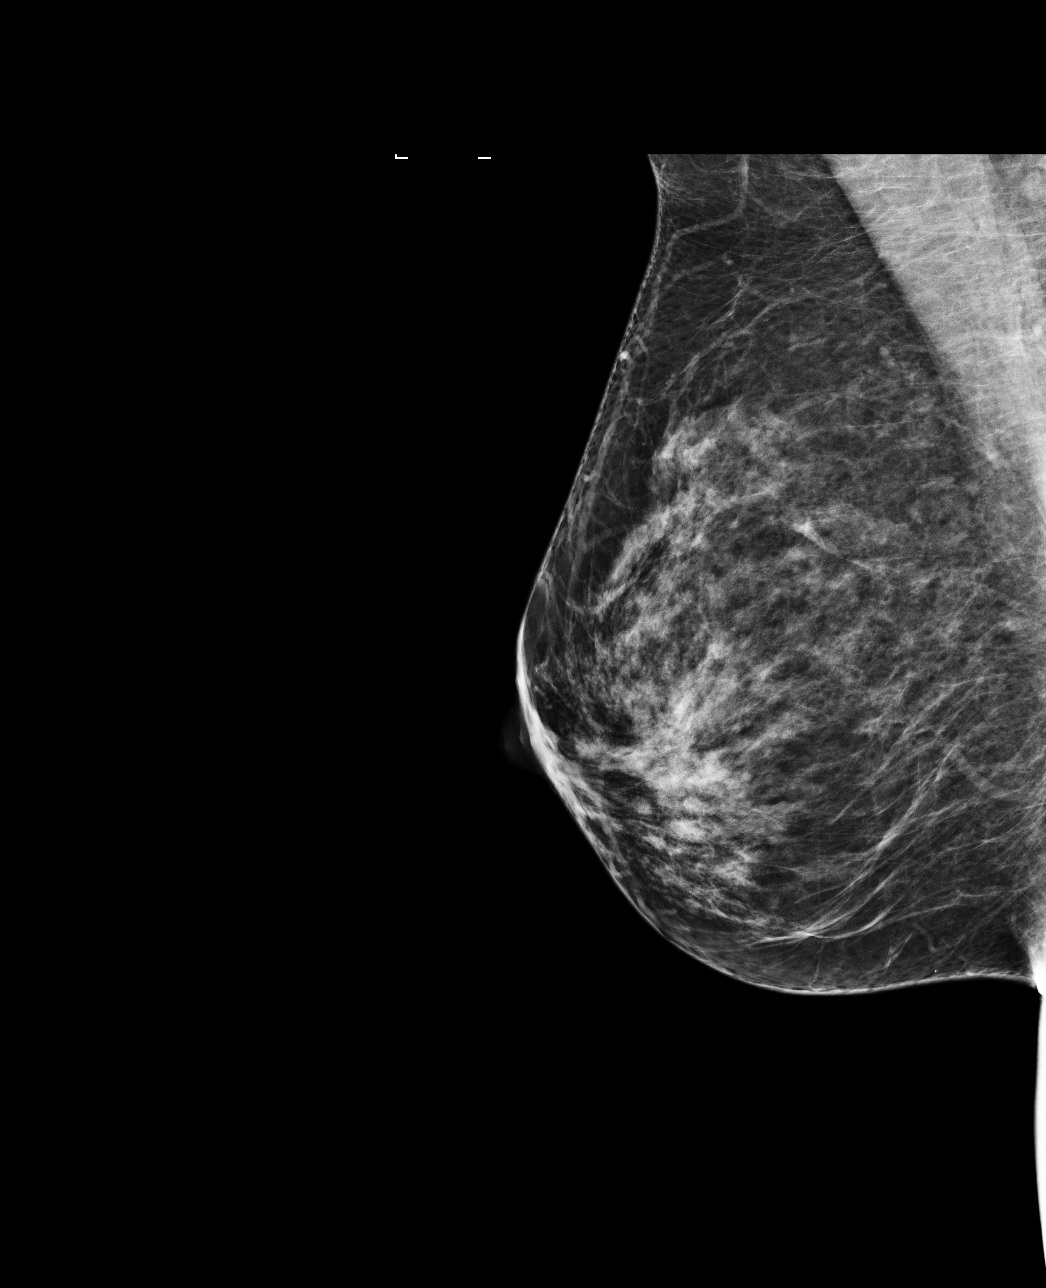

[4 of 4 positions shown; findings below may reference images not displayed]

ACR Breast Density Category c: The breast tissue is heterogeneously
dense, which may obscure small masses.
FINDINGS: There are no findings suspicious for malignancy. Images were
processed with CAD.
IMPRESSION: No mammographic evidence of malignancy. A result letter of this
screening mammogram will be mailed directly to the patient.

RECOMMENDATION:
Screening mammogram in one year. (Code:[id number])

BI-RADS CATEGORY  1: Negative.

## 2019-10-17 MED FILL — ARMOUR THYROID 15 MG TABLET: 15 | 30 days supply | Qty: 30 | Fill #1

## 2019-10-17 MED FILL — ARMOUR THYROID 60 MG TABLET: 60 | 30 days supply | Qty: 30 | Fill #1

## 2019-11-13 ENCOUNTER — Telehealth: Payer: Self-pay | Admitting: Neurology

## 2019-11-13 ENCOUNTER — Telehealth: Payer: Self-pay | Admitting: *Deleted

## 2019-11-13 ENCOUNTER — Ambulatory Visit: Payer: Self-pay | Admitting: Neurology

## 2019-11-13 NOTE — Telephone Encounter (Signed)
I called the pt and LVM asking for call back or mychart message to r/s the appt with Dr Jaynee Eagles today. I advised MD is unexpectedly out of the office. I have offered to squeeze pt onto schedule this Wed 10/20 @ 4:30 pm. If pt calls back, please see if she can make this. If not, let me know and we will try to find something else.

## 2019-11-13 NOTE — Telephone Encounter (Signed)
Patient arrived for her Botox apt today which needs to be resch. She did not check her messages from this morning. She stated the next available in November is too far out and requests to be worked in. Best call back [phone]

## 2019-11-13 NOTE — Telephone Encounter (Signed)
called the pt and LVM asking for call back or mychart message to r/s the appt with Dr Jaynee Eagles today. I have offered in this message and on the previous VM and mychart message to squeeze pt onto schedule this Wed 10/20 @ 4:30 pm. If pt calls back, please see if she can make this. If not, let me know and we will try to find something else.

## 2019-11-14 ENCOUNTER — Ambulatory Visit: Payer: Self-pay | Admitting: Neurology

## 2019-11-14 NOTE — Telephone Encounter (Signed)
I spoke with patient. She will take the 4:30 on 10/20 pending Dr. Cathren Laine return.

## 2019-11-15 ENCOUNTER — Other Ambulatory Visit: Payer: Self-pay

## 2019-11-15 ENCOUNTER — Other Ambulatory Visit: Payer: Self-pay | Admitting: Nurse Practitioner

## 2019-11-15 ENCOUNTER — Ambulatory Visit: Payer: 59 | Admitting: Neurology

## 2019-11-15 DIAGNOSIS — G43709 Chronic migraine without aura, not intractable, without status migrainosus: Secondary | ICD-10-CM | POA: Diagnosis not present

## 2019-11-15 DIAGNOSIS — I10 Essential (primary) hypertension: Secondary | ICD-10-CM

## 2019-11-15 MED ORDER — NURTEC 75 MG PO TBDP: 75.0000 mg | ORAL_TABLET | Freq: Every day | ORAL | 0 refills | Status: DC | PRN

## 2019-11-15 MED FILL — LOSARTAN POTASSIUM 25 MG TA: 25 | 30 days supply | Qty: 30 | Fill #0

## 2019-11-15 MED FILL — ARMOUR THYROID 60 MG TABLET: 60 | 30 days supply | Qty: 30 | Fill #2

## 2019-11-15 MED FILL — ARMOUR THYROID 15 MG TABLET: 15 | 30 days supply | Qty: 30 | Fill #2

## 2019-11-15 NOTE — Patient Instructions (Signed)
Rimegepant oral dissolving tablet What is this medicine? RIMEGEPANT (ri ME je pant) is used to treat migraine headaches with or without aura. An aura is a strange feeling or visual disturbance that warns you of an attack. It is not used to prevent migraines. This medicine may be used for other purposes; ask your health care provider or pharmacist if you have questions. COMMON BRAND NAME(S): NURTEC ODT What should I tell my health care provider before I take this medicine? They need to know if you have any of these conditions:  kidney disease  liver disease  an unusual or allergic reaction to rimegepant, other medicines, foods, dyes, or preservatives  pregnant or trying to get pregnant  breast-feeding How should I use this medicine? Take the medicine by mouth. Follow the directions on the prescription label. Leave the tablet in the sealed blister pack until you are ready to take it. With dry hands, open the blister and gently remove the tablet. If the tablet breaks or crumbles, throw it away and take a new tablet out of the blister pack. Place the tablet in the mouth and allow it to dissolve, and then swallow. Do not cut, crush, or chew this medicine. You do not need water to take this medicine. Talk to your pediatrician about the use of this medicine in children. Special care may be needed. Overdosage: If you think you have taken too much of this medicine contact a poison control center or emergency room at once. NOTE: This medicine is only for you. Do not share this medicine with others. What if I miss a dose? This does not apply. This medicine is not for regular use. What may interact with this medicine? This medicine may interact with the following medications:  certain medicines for fungal infections like fluconazole, itraconazole  rifampin This list may not describe all possible interactions. Give your health care provider a list of all the medicines, herbs, non-prescription drugs,  or dietary supplements you use. Also tell them if you smoke, drink alcohol, or use illegal drugs. Some items may interact with your medicine. What should I watch for while using this medicine? Visit your health care professional for regular checks on your progress. Tell your health care professional if your symptoms do not start to get better or if they get worse. What side effects may I notice from receiving this medicine? Side effects that you should report to your doctor or health care professional as soon as possible:  allergic reactions like skin rash, itching or hives; swelling of the face, lips, or tongue Side effects that usually do not require medical attention (report these to your doctor or health care professional if they continue or are bothersome):  nausea This list may not describe all possible side effects. Call your doctor for medical advice about side effects. You may report side effects to FDA at 1-800-FDA-1088. Where should I keep my medicine? Keep out of the reach of children. Store at room temperature between 15 and 30 degrees C (59 and 86 degrees F). Throw away any unused medicine after the expiration date. NOTE: This sheet is a summary. It may not cover all possible information. If you have questions about this medicine, talk to your doctor, pharmacist, or health care provider.  2020 Elsevier/Gold Standard (2018-03-28 00:21:31)

## 2019-11-15 NOTE — Progress Notes (Signed)
Botox- 200 units x 1 vial Lot: [id number] Expiration: 04/2022 NDC: 2637-8588-50  Bacteriostatic 0.9% Sodium Chloride- 16mL total Lot: [id number] Expiration: 10/26/2020 NDC: 8786-7672-09  Dx: O70.962 B/B

## 2019-11-15 NOTE — Progress Notes (Signed)
Consent Form Botulism Toxin Injection For Chronic Migraine   11/15/2019: She is still doing great. But it wore off, wasn;t as effective higher up in the forehead but be careful for forehead drooping we did it 3/4 up the forehead instead. +temporals. NO masseters. Will try nurtec.  Meds ordered this encounter  Medications  . Rimegepant Sulfate (NURTEC) 75 MG TBDP    Sig: Take 75 mg by mouth daily as needed. For migraines. Take as close to onset of migraine as possible. One daily maximum.    Dispense:  4 tablet    Refill:  0    08/08/2019: She is still doing great. But it wore off, wasn;t as effective higher up in the forehead but be careful for forehead drooping we did it 3/4 up the forehead instead. +temporals. NO masseters.   Reviewed orally with patient, additionally signature is on file:  Botulism toxin has been approved by the Federal drug administration for treatment of chronic migraine. Botulism toxin does not cure chronic migraine and it may not be effective in some patients.  The administration of botulism toxin is accomplished by injecting a small amount of toxin into the muscles of the neck and head. Dosage must be titrated for each individual. Any benefits resulting from botulism toxin tend to wear off after 3 months with a repeat injection required if benefit is to be maintained. Injections are usually done every 3-4 months with maximum effect peak achieved by about 2 or 3 weeks. Botulism toxin is expensive and you should be sure of what costs you will incur resulting from the injection.  The side effects of botulism toxin use for chronic migraine may include:   -Transient, and usually mild, facial weakness with facial injections  -Transient, and usually mild, head or neck weakness with head/neck injections  -Reduction or loss of forehead facial animation due to forehead muscle weakness  -Eyelid drooping  -Dry eye  -Pain at the site of injection or bruising at the site of  injection  -Double vision  -Potential unknown long term risks  Contraindications: You should not have Botox if you are pregnant, nursing, allergic to albumin, have an infection, skin condition, or muscle weakness at the site of the injection, or have myasthenia gravis, Lambert-Eaton syndrome, or ALS.  It is also possible that as with any injection, there may be an allergic reaction or no effect from the medication. Reduced effectiveness after repeated injections is sometimes seen and rarely infection at the injection site may occur. All care will be taken to prevent these side effects. If therapy is given over a long time, atrophy and wasting in the muscle injected may occur. Occasionally the patient's become refractory to treatment because they develop antibodies to the toxin. In this event, therapy needs to be modified.  I have read the above information and consent to the administration of botulism toxin.    BOTOX PROCEDURE NOTE FOR MIGRAINE HEADACHE    Contraindications and precautions discussed with patient(above). Aseptic procedure was observed and patient tolerated procedure. Procedure performed by Dr. Georgia Dom  The condition has existed for more than 6 months, and pt does not have a diagnosis of ALS, Myasthenia Gravis or Lambert-Eaton Syndrome.  Risks and benefits of injections discussed and pt agrees to proceed with the procedure.  Written consent obtained  These injections are medically necessary. Pt  receives good benefits from these injections. These injections do not cause sedations or hallucinations which the oral therapies may cause.  Description of  procedure:  The patient was placed in a sitting position. The standard protocol was used for Botox as follows, with 5 units of Botox injected at each site:   -Procerus muscle, midline injection  -Corrugator muscle, bilateral injection  -Frontalis muscle, bilateral injection, with 2 sites each side, medial injection was  performed in the upper one third of the frontalis muscle, in the region vertical from the medial inferior edge of the superior orbital rim. The lateral injection was again in the upper one third of the forehead vertically above the lateral limbus of the cornea, 1.5 cm lateral to the medial injection site.  - Levator Scapulae: 5 units bilaterally  -Temporalis muscle injection, 5 sites, bilaterally. The first injection was 3 cm above the tragus of the ear, second injection site was 1.5 cm to 3 cm up from the first injection site in line with the tragus of the ear. The third injection site was 1.5-3 cm forward between the first 2 injection sites. The fourth injection site was 1.5 cm posterior to the second injection site. 5th site laterally in the temporalis  muscleat the level of the outer canthus.  - Patient feels her clenching is a trigger for headaches. +5 units masseter bilaterally   - Patient feels the migraines are centered around the eyes +5 units bilaterally at the outer canthus in the orbicularis occuli  -Occipitalis muscle injection, 3 sites, bilaterally. The first injection was done one half way between the occipital protuberance and the tip of the mastoid process behind the ear. The second injection site was done lateral and superior to the first, 1 fingerbreadth from the first injection. The third injection site was 1 fingerbreadth superiorly and medially from the first injection site.  -Cervical paraspinal muscle injection, 2 sites, bilateral knee first injection site was 1 cm from the midline of the cervical spine, 3 cm inferior to the lower border of the occipital protuberance. The second injection site was 1.5 cm superiorly and laterally to the first injection site.  -Trapezius muscle injection was performed at 3 sites, bilaterally. The first injection site was in the upper trapezius muscle halfway between the inflection point of the neck, and the acromion. The second injection site was  one half way between the acromion and the first injection site. The third injection was done between the first injection site and the inflection point of the neck.   Will return for repeat injection in 3 months.   A 200 unit sof Botox was used, any Botox not injected was wasted. The patient tolerated the procedure well, there were no complications of the above procedure.

## 2019-11-16 ENCOUNTER — Telehealth: Payer: Self-pay | Admitting: *Deleted

## 2019-11-16 NOTE — Telephone Encounter (Signed)
Accommodation form for work completed, signed, and sent to medical records for processing. Upon discussion with patient and Dr Jaynee Eagles, form completed to allow patient to work from home up to 8 days per month as needed for severe migraine. Also provided 2 days per month to be absent from in event she has a severe migraine and is unable to work altogether.

## 2019-11-21 ENCOUNTER — Ambulatory Visit: Payer: 59 | Admitting: Neurology

## 2019-12-06 DIAGNOSIS — H52223 Regular astigmatism, bilateral: Secondary | ICD-10-CM | POA: Diagnosis not present

## 2019-12-06 DIAGNOSIS — H5021 Vertical strabismus, right eye: Secondary | ICD-10-CM | POA: Diagnosis not present

## 2019-12-06 DIAGNOSIS — H524 Presbyopia: Secondary | ICD-10-CM | POA: Diagnosis not present

## 2019-12-06 DIAGNOSIS — H5203 Hypermetropia, bilateral: Secondary | ICD-10-CM | POA: Diagnosis not present

## 2019-12-07 ENCOUNTER — Other Ambulatory Visit: Payer: Self-pay | Admitting: Obstetrics and Gynecology

## 2019-12-07 DIAGNOSIS — Z1231 Encounter for screening mammogram for malignant neoplasm of breast: Secondary | ICD-10-CM

## 2019-12-16 ENCOUNTER — Other Ambulatory Visit: Payer: Self-pay | Admitting: Nurse Practitioner

## 2019-12-16 DIAGNOSIS — I10 Essential (primary) hypertension: Secondary | ICD-10-CM

## 2019-12-18 ENCOUNTER — Other Ambulatory Visit: Payer: Self-pay | Admitting: Nurse Practitioner

## 2019-12-18 MED FILL — ARMOUR THYROID 60 MG TABLET: 60 | 30 days supply | Qty: 30 | Fill #3

## 2019-12-18 MED FILL — ARMOUR THYROID 15 MG TABLET: 15 | 30 days supply | Qty: 30 | Fill #3

## 2019-12-18 MED FILL — LOSARTAN POTASSIUM 25 MG TA: 25 | 30 days supply | Qty: 30 | Fill #0

## 2019-12-18 NOTE — Telephone Encounter (Signed)
Last VV 04/20/19 Last fill 11/15/19 #30/0

## 2019-12-19 ENCOUNTER — Ambulatory Visit: Payer: Self-pay | Admitting: Neurology

## 2020-01-13 ENCOUNTER — Other Ambulatory Visit: Payer: Self-pay | Admitting: Nurse Practitioner

## 2020-01-13 DIAGNOSIS — I10 Essential (primary) hypertension: Secondary | ICD-10-CM

## 2020-01-13 MED FILL — ARMOUR THYROID 15 MG TABLET: 15 | 30 days supply | Qty: 30 | Fill #4

## 2020-01-13 MED FILL — ARMOUR THYROID 60 MG TABLET: 60 | 30 days supply | Qty: 30 | Fill #4

## 2020-01-16 NOTE — Telephone Encounter (Signed)
.   NO ADDITIONAL REFILLS WITHOUT AN APPOINTMENT WITH PCP Last VV 04/20/19 Last fill 12/18/19 #30/0

## 2020-01-18 ENCOUNTER — Other Ambulatory Visit: Payer: Self-pay | Admitting: Nurse Practitioner

## 2020-01-18 DIAGNOSIS — I10 Essential (primary) hypertension: Secondary | ICD-10-CM

## 2020-01-18 NOTE — Telephone Encounter (Signed)
Patient is calling to check the status of her refill. She is completely out. Please call her at [phone].

## 2020-01-22 ENCOUNTER — Encounter: Payer: Self-pay | Admitting: Nurse Practitioner

## 2020-01-22 ENCOUNTER — Ambulatory Visit: Payer: 59

## 2020-01-23 ENCOUNTER — Other Ambulatory Visit: Payer: Self-pay | Admitting: Nurse Practitioner

## 2020-01-23 ENCOUNTER — Telehealth: Payer: Self-pay

## 2020-01-23 DIAGNOSIS — I10 Essential (primary) hypertension: Secondary | ICD-10-CM

## 2020-01-23 MED ORDER — LOSARTAN POTASSIUM 25 MG PO TABS: 25.0000 mg | ORAL_TABLET | Freq: Every day | ORAL | 0 refills | Status: DC

## 2020-01-23 MED FILL — LOSARTAN POTASSIUM 25 MG TA: 25 | 30 days supply | Qty: 30 | Fill #0

## 2020-01-23 NOTE — Telephone Encounter (Signed)
Last fill 12/18/19  #30/0 Last VV 04/20/19   Next OV 02/06/19

## 2020-02-06 ENCOUNTER — Ambulatory Visit: Payer: 59 | Admitting: Nurse Practitioner

## 2020-02-12 ENCOUNTER — Encounter: Payer: Self-pay | Admitting: Nurse Practitioner

## 2020-02-12 ENCOUNTER — Telehealth: Payer: Self-pay | Admitting: Nurse Practitioner

## 2020-02-12 NOTE — Telephone Encounter (Signed)
Pt was no show for appt 02/06/2020, rs for 02/28/2020. 1st occurrence. Fee waived. Letter mailed.

## 2020-02-13 ENCOUNTER — Other Ambulatory Visit: Payer: Self-pay | Admitting: Nurse Practitioner

## 2020-02-13 DIAGNOSIS — I10 Essential (primary) hypertension: Secondary | ICD-10-CM

## 2020-02-13 MED FILL — ARMOUR THYROID 60 MG TABLET: 60 | 30 days supply | Qty: 30 | Fill #5

## 2020-02-13 MED FILL — ARMOUR THYROID 15 MG TABLET: 15 | 30 days supply | Qty: 30 | Fill #5

## 2020-02-16 ENCOUNTER — Other Ambulatory Visit: Payer: Self-pay

## 2020-02-16 ENCOUNTER — Encounter: Payer: Self-pay | Admitting: Nurse Practitioner

## 2020-02-16 ENCOUNTER — Other Ambulatory Visit: Payer: Self-pay | Admitting: Nurse Practitioner

## 2020-02-16 DIAGNOSIS — I10 Essential (primary) hypertension: Secondary | ICD-10-CM

## 2020-02-16 MED ORDER — LOSARTAN POTASSIUM 25 MG PO TABS: 25.0000 mg | ORAL_TABLET | Freq: Every day | ORAL | 0 refills | Status: DC

## 2020-02-16 MED FILL — LOSARTAN POTASSIUM 25 MG TA: 25 | 30 days supply | Qty: 30 | Fill #0

## 2020-02-16 NOTE — Telephone Encounter (Signed)
Pt called stating that she only got #25 doses on 01/22/2021. Pt has appt scheduled for 02/28/2020. Pt has 1 pill left.   [place] please

## 2020-02-19 NOTE — Progress Notes (Signed)
  Consent Form Botulism Toxin Injection For Chronic Migraine   02/20/2020: She is still doing great. But it wore off, wasn;t as effective higher up in the forehead but be careful for forehead drooping we did it 3/4 up the forehead instead. +temporals. NO masseters. Will try nurtec.She loved nurtec has only had to use one. +5Orb Oculi. +Temporalis. Also +5U occipital emergence and +10 bilat U cervical paraspinals, ask her to show where to place the extras in the cervical muscles she has a few spots in her lower cervical spine - ask. She goes to [facility]  No orders of the defined types were placed in this encounter.   08/08/2019: She is still doing great. But it wore off, wasn;t as effective higher up in the forehead but be careful for forehead drooping we did it 3/4 up the forehead instead. +temporals. NO masseters.   Reviewed orally with patient, additionally signature is on file:  Botulism toxin has been approved by the Federal drug administration for treatment of chronic migraine. Botulism toxin does not cure chronic migraine and it may not be effective in some patients.  The administration of botulism toxin is accomplished by injecting a small amount of toxin into the muscles of the neck and head. Dosage must be titrated for each individual. Any benefits resulting from botulism toxin tend to wear off after 3 months with a repeat injection required if benefit is to be maintained. Injections are usually done every 3-4 months with maximum effect peak achieved by about 2 or 3 weeks. Botulism toxin is expensive and you should be sure of what costs you will incur resulting from the injection.  The side effects of botulism toxin use for chronic migraine may include:   -Transient, and usually mild, facial weakness with facial injections  -Transient, and usually mild, head or neck weakness with head/neck injections  -Reduction or loss of forehead facial animation due to forehead muscle  weakness  -Eyelid drooping  -Dry eye  -Pain at the site of injection or bruising at the site of injection  -Double vision  -Potential unknown long term risks  Contraindications: You should not have Botox if you are pregnant, nursing, allergic to albumin, have an infection, skin condition, or muscle weakness at the site of the injection, or have myasthenia gravis, Lambert-Eaton syndrome, or ALS.  It is also possible that as with any injection, there may be an allergic reaction or no effect from the medication. Reduced effectiveness after repeated injections is sometimes seen and rarely infection at the injection site may occur. All care will be taken to prevent these side effects. If therapy is given over a long time, atrophy and wasting in the muscle injected may occur. Occasionally the patient's become refractory to treatment because they develop antibodies to the toxin. In this event, therapy needs to be modified.  I have read the above information and consent to the administration of botulism toxin.    BOTOX PROCEDURE NOTE FOR MIGRAINE HEADACHE    Contraindications and precautions discussed with patient(above). Aseptic procedure was observed and patient tolerated procedure. Procedure performed by Dr. Toni Amay Mijangos  The condition has existed for more than 6 months, and pt does not have a diagnosis of ALS, Myasthenia Gravis or Lambert-Eaton Syndrome.  Risks and benefits of injections discussed and pt agrees to proceed with the procedure.  Written consent obtained  These injections are medically necessary. Pt  receives good benefits from these injections. These injections do not cause sedations or   hallucinations which the oral therapies may cause.  Description of procedure:  The patient was placed in a sitting position. The standard protocol was used for Botox as follows, with 5 units of Botox injected at each site:   -Procerus muscle, midline injection  -Corrugator muscle, bilateral  injection  -Frontalis muscle, bilateral injection, with 2 sites each side, medial injection was performed in the upper one third of the frontalis muscle, in the region vertical from the medial inferior edge of the superior orbital rim. The lateral injection was again in the upper one third of the forehead vertically above the lateral limbus of the cornea, 1.5 cm lateral to the medial injection site.  - Levator Scapulae: 5 units bilaterally  -Temporalis muscle injection, 5 sites, bilaterally. The first injection was 3 cm above the tragus of the ear, second injection site was 1.5 cm to 3 cm up from the first injection site in line with the tragus of the ear. The third injection site was 1.5-3 cm forward between the first 2 injection sites. The fourth injection site was 1.5 cm posterior to the second injection site. 5th site laterally in the temporalis  muscleat the level of the outer canthus.  - Patient feels her clenching is a trigger for headaches. +5 units masseter bilaterally   - Patient feels the migraines are centered around the eyes +5 units bilaterally at the outer canthus in the orbicularis occuli  -Occipitalis muscle injection, 3 sites, bilaterally. The first injection was done one half way between the occipital protuberance and the tip of the mastoid process behind the ear. The second injection site was done lateral and superior to the first, 1 fingerbreadth from the first injection. The third injection site was 1 fingerbreadth superiorly and medially from the first injection site.  -Cervical paraspinal muscle injection, 2 sites, bilateral knee first injection site was 1 cm from the midline of the cervical spine, 3 cm inferior to the lower border of the occipital protuberance. The second injection site was 1.5 cm superiorly and laterally to the first injection site.  -Trapezius muscle injection was performed at 3 sites, bilaterally. The first injection site was in the upper trapezius muscle  halfway between the inflection point of the neck, and the acromion. The second injection site was one half way between the acromion and the first injection site. The third injection was done between the first injection site and the inflection point of the neck.   Will return for repeat injection in 3 months.   A 200 unit sof Botox was used, any Botox not injected was wasted. The patient tolerated the procedure well, there were no complications of the above procedure.   

## 2020-02-20 ENCOUNTER — Other Ambulatory Visit: Payer: Self-pay | Admitting: Nurse Practitioner

## 2020-02-20 ENCOUNTER — Ambulatory Visit: Payer: 59 | Admitting: Neurology

## 2020-02-20 DIAGNOSIS — G43709 Chronic migraine without aura, not intractable, without status migrainosus: Secondary | ICD-10-CM | POA: Diagnosis not present

## 2020-02-20 DIAGNOSIS — Z1231 Encounter for screening mammogram for malignant neoplasm of breast: Secondary | ICD-10-CM

## 2020-02-20 NOTE — Progress Notes (Signed)
Botox- 200 units x 1 vial Lot: [id number] Expiration: 10/2022 NDC: 0023-3921-02  Bacteriostatic 0.9% Sodium Chloride- 4mL total Lot: [id number] Expiration: 02/26/2021 NDC: 0409-1966-02  Dx: G43.709 B/B  

## 2020-02-22 ENCOUNTER — Ambulatory Visit
Admission: RE | Admit: 2020-02-22 | Discharge: 2020-02-22 | Disposition: A | Discharge status: Home / Self Care | Payer: 59 | Source: Ambulatory Visit

## 2020-02-22 ENCOUNTER — Other Ambulatory Visit: Payer: Self-pay

## 2020-02-22 DIAGNOSIS — Z1231 Encounter for screening mammogram for malignant neoplasm of breast: Secondary | ICD-10-CM

## 2020-02-28 ENCOUNTER — Telehealth: Payer: 59 | Admitting: Nurse Practitioner

## 2020-03-04 ENCOUNTER — Telehealth: Payer: Self-pay | Admitting: Neurology

## 2020-03-04 NOTE — Telephone Encounter (Signed)
Patient provided a corrected number: [id number]. Faxed form. Received a receipt of confirmation.

## 2020-03-04 NOTE — Telephone Encounter (Signed)
Spoke with patient. She asked for the form to be faxed to her at [phone]. Form faxed.

## 2020-03-04 NOTE — Telephone Encounter (Signed)
Pt called, Dr. Jaynee Eagles told me to call Emily Barker to discuss Dr. Jaynee Eagles has signed. I have been calling the number Dr. Jaynee Eagles gave me [phone].  Would like a call from the nurse. Pt did not want to elaborate.

## 2020-03-12 ENCOUNTER — Other Ambulatory Visit: Payer: Self-pay | Admitting: Nurse Practitioner

## 2020-03-12 DIAGNOSIS — I10 Essential (primary) hypertension: Secondary | ICD-10-CM

## 2020-03-12 DIAGNOSIS — H905 Unspecified sensorineural hearing loss: Secondary | ICD-10-CM | POA: Diagnosis not present

## 2020-03-12 MED FILL — LOSARTAN POTASSIUM 25 MG TA: 25 | 30 days supply | Qty: 30 | Fill #0

## 2020-03-12 MED FILL — ARMOUR THYROID 15 MG TABLET: 15 | 30 days supply | Qty: 30 | Fill #0

## 2020-03-14 ENCOUNTER — Encounter: Payer: Self-pay | Admitting: Nurse Practitioner

## 2020-03-18 ENCOUNTER — Other Ambulatory Visit (HOSPITAL_COMMUNITY): Payer: Self-pay | Admitting: Endocrinology

## 2020-03-18 MED FILL — ARMOUR THYROID 60 MG TABLET: 60 | 30 days supply | Qty: 30 | Fill #0

## 2020-03-19 ENCOUNTER — Other Ambulatory Visit: Payer: Self-pay

## 2020-03-20 ENCOUNTER — Encounter: Payer: Self-pay | Admitting: Nurse Practitioner

## 2020-03-20 ENCOUNTER — Other Ambulatory Visit: Payer: Self-pay | Admitting: Nurse Practitioner

## 2020-03-20 ENCOUNTER — Ambulatory Visit: Payer: 59 | Admitting: Nurse Practitioner

## 2020-03-20 VITALS — BP 110/78 | Temp 97.3°F | Ht 64.0 in | Wt 183.4 lb

## 2020-03-20 DIAGNOSIS — I10 Essential (primary) hypertension: Secondary | ICD-10-CM | POA: Diagnosis not present

## 2020-03-20 DIAGNOSIS — E7800 Pure hypercholesterolemia, unspecified: Secondary | ICD-10-CM | POA: Diagnosis not present

## 2020-03-20 DIAGNOSIS — E063 Autoimmune thyroiditis: Secondary | ICD-10-CM | POA: Diagnosis not present

## 2020-03-20 DIAGNOSIS — E038 Other specified hypothyroidism: Secondary | ICD-10-CM

## 2020-03-20 LAB — COMPREHENSIVE METABOLIC PANEL
ALT: 9 U/L (ref 0–35)
AST: 14 U/L (ref 0–37)
Albumin: 4.3 g/dL (ref 3.5–5.2)
Alkaline Phosphatase: 58 U/L (ref 39–117)
BUN: 11 mg/dL (ref 6–23)
CO2: 31 mEq/L (ref 19–32)
Calcium: 9.8 mg/dL (ref 8.4–10.5)
Chloride: 105 mEq/L (ref 96–112)
Creatinine, Ser: 0.78 mg/dL (ref 0.40–1.20)
GFR: 90.93 mL/min (ref >60.00)
Glucose, Bld: 96 mg/dL (ref 70–99)
Potassium: 4.8 mEq/L (ref 3.5–5.1)
Sodium: 141 mEq/L (ref 135–145)
Total Bilirubin: 0.5 mg/dL (ref 0.2–1.2)
Total Protein: 7.1 g/dL (ref 6.0–8.3)

## 2020-03-20 LAB — LIPID PANEL
Cholesterol: 209 mg/dL — ABNORMAL HIGH (ref 0–200)
HDL: 60.5 mg/dL (ref >39.00)
LDL Cholesterol: 133 mg/dL — ABNORMAL HIGH (ref 0–99)
NonHDL: 148.49
Total CHOL/HDL Ratio: 3
Triglycerides: 76 mg/dL (ref 0.0–149.0)
VLDL: 15.2 mg/dL (ref 0.0–40.0)

## 2020-03-20 LAB — T4, FREE: Free T4: 0.83 ng/dL (ref 0.60–1.60)

## 2020-03-20 LAB — T3, FREE: T3, Free: 5.8 pg/mL — ABNORMAL HIGH (ref 2.3–4.2)

## 2020-03-20 LAB — TSH: TSH: 1.8 u[IU]/mL (ref 0.35–4.50)

## 2020-03-20 MED ORDER — LOSARTAN POTASSIUM 25 MG PO TABS
25.0000 mg | ORAL_TABLET | Freq: Every day | ORAL | 3 refills | Status: DC
Start: 2020-03-20 — End: 2020-03-20

## 2020-03-20 NOTE — Assessment & Plan Note (Signed)
Under the care of Dr. Hartford Poli with [facility]. Repeat TSh, T4 and T3 and forward results per patient's request. Stable weight, mood, temperature tolerance, and GI function. Persistently enlarged thyroid with no tenderness

## 2020-03-20 NOTE — Assessment & Plan Note (Signed)
BP at goal with losartan Repeat BMP today BP Readings from Last 3 Encounters:  03/20/20 110/78  09/26/19 132/82  12/09/18 110/82   Continue current medication

## 2020-03-20 NOTE — Patient Instructions (Signed)
Go to lab for blood draw Will forward lab results to Dr. Hartford Poli.

## 2020-03-20 NOTE — Assessment & Plan Note (Signed)
Stable CMP Abnormal lipid panel: persistent elevated TC and LDL. Your calculated risk for cardiovascular disease over the next 33yrs is 0.8%. this is low, so no medication needed at this time. I only recommend Dsh diet and regular exercise at this time.

## 2020-03-20 NOTE — Progress Notes (Addendum)
Subjective:  Patient ID: Emily Barker, female    DOB: 17-Apr-1975  Age: 45 y.o. MRN: [id number]  CC: Follow-up (6 month f/u, medication refills needed on losartan, pt states she needs refills for the rest of the year. )  HPI  Hypothyroid Under the care of Dr. Hartford Poli with [facility] End[facility]. Repeat TSh, T4 and T3 and forward results per patient's request. Stable weight, mood, temperature tolerance, and GI function. Persistently enlarged thyroid with no tenderness  Essential (primary) hypertension BP at goal with losartan Repeat BMP today BP Readings from Last 3 Encounters:  03/20/20 110/78  09/26/19 132/82  12/09/18 110/82   Continue current medication  Pure hypercholesterolemia Stable CMP Abnormal lipid panel: persistent elevated TC and LDL. Your calculated risk for cardiovascular disease over the next 61yrs is 0.8%. this is low, so no medication needed at this time. I only recommend Dsh diet and regular exercise at this time.   Wt Readings from Last 3 Encounters:  03/20/20 183 lb 6.4 oz (83.2 kg)  09/26/19 189 lb 6.4 oz (85.9 kg)  04/20/19 180 lb (81.6 kg)   Reviewed past Medical, Social and Family history today.  Outpatient Medications Prior to Visit  Medication Sig Dispense Refill  . Cyanocobalamin (VITAMIN B-12 PO) Take by mouth.    . fluticasone (FLONASE) 50 MCG/ACT nasal spray Place into both nostrils daily as needed.     . loratadine (CLARITIN) 5 MG chewable tablet Chew 5 mg by mouth as needed.     . Multiple Vitamin (MULTIVITAMIN) tablet Take 1 tablet by mouth every other day.     . Rimegepant Sulfate (NURTEC) 75 MG TBDP Take 75 mg by mouth daily as needed. For migraines. Take as close to onset of migraine as possible. One daily maximum. 4 tablet 0  . thyroid (ARMOUR) 15 MG tablet Take 1 tablet by mouth daily.    Marland Kitchen thyroid (ARMOUR) 60 MG tablet Take 1 tablet (60 mg total) by mouth daily before breakfast. 90 tablet 0  . losartan (COZAAR) 25 MG tablet TAKE 1  TABLET BY MOUTH DAILY. NO ADDITIONAL REFILLS WITHOUT AN APPOINTMENT WITH PCP 30 tablet 0   Facility-Administered Medications Prior to Visit  Medication Dose Route Frequency Provider Last Rate Last Admin  . 0.9 %  sodium chloride infusion  500 mL Intravenous Once Nandigam, Kavitha V, MD        ROS See HPI  Objective:  BP 110/78 (BP Location: Left Arm, Patient Position: Sitting, Cuff Size: Large)   Temp (!) 97.3 F (36.3 C) (Temporal)   Ht 5\' 4"  (1.626 m)   Wt 183 lb 6.4 oz (83.2 kg)   SpO2 100%   BMI 31.48 kg/m   Physical Exam Neck:     Thyroid: Thyromegaly present. No thyroid mass or thyroid tenderness.  Cardiovascular:     Rate and Rhythm: Normal rate and regular rhythm.     Pulses: Normal pulses.     Heart sounds: Normal heart sounds.  Pulmonary:     Effort: Pulmonary effort is normal.     Breath sounds: Normal breath sounds.  Musculoskeletal:     Cervical back: No tenderness.  Lymphadenopathy:     Cervical: No cervical adenopathy.  Neurological:     Mental Status: She is alert and oriented to person, place, and time.     Assessment & Plan:  This visit occurred during the SARS-CoV-2 public health emergency.  Safety protocols were in place, including screening questions prior to the visit, additional usage of  staff PPE, and extensive cleaning of exam room while observing appropriate contact time as indicated for disinfecting solutions.   Emily Barker was seen today for follow-up.  Diagnoses and all orders for this visit:  Essential hypertension -     Comprehensive metabolic panel -     losartan (COZAAR) 25 MG tablet; Take 1 tablet (25 mg total) by mouth daily.  Hypothyroidism due to Hashimoto's thyroiditis -     TSH -     T4, free -     T3, free  Pure hypercholesterolemia -     Lipid panel    Problem List Items Addressed This Visit      Cardiovascular and Mediastinum   Essential (primary) hypertension - Primary    BP at goal with losartan Repeat BMP  today BP Readings from Last 3 Encounters:  03/20/20 110/78  09/26/19 132/82  12/09/18 110/82   Continue current medication      Relevant Medications   losartan (COZAAR) 25 MG tablet     Endocrine   Hypothyroid    Under the care of Dr. Hartford Poli with [facility] Endocr[facility]. Repeat TSh, T4 and T3 and forward results per patient's request. Stable weight, mood, temperature tolerance, and GI function. Persistently enlarged thyroid with no tenderness      Relevant Orders   TSH (Completed)   T4, free (Completed)   T3, free (Completed)     Other   Pure hypercholesterolemia    Stable CMP Abnormal lipid panel: persistent elevated TC and LDL. Your calculated risk for cardiovascular disease over the next 13yrs is 0.8%. this is low, so no medication needed at this time. I only recommend Dsh diet and regular exercise at this time.      Relevant Medications   losartan (COZAAR) 25 MG tablet   Other Relevant Orders   Lipid panel (Completed)      Follow-up: Return in about 6 months (around 09/17/2020) for CPE (fasting).  Wilfred Lacy, NP

## 2020-04-15 MED FILL — ARMOUR THYROID 60 MG TABLET: 60 | 30 days supply | Qty: 30 | Fill #1

## 2020-04-19 ENCOUNTER — Other Ambulatory Visit (HOSPITAL_BASED_OUTPATIENT_CLINIC_OR_DEPARTMENT_OTHER): Payer: Self-pay

## 2020-05-12 MED FILL — Thyroid Tab 60 MG (1 Grain): ORAL | 30 days supply | Qty: 30 | Fill #0 | Status: AC

## 2020-05-12 MED FILL — Thyroid Tab 15 MG (1/4 Grain): ORAL | 30 days supply | Qty: 30 | Fill #0 | Status: AC

## 2020-05-12 MED FILL — Losartan Potassium Tab 25 MG: ORAL | 90 days supply | Qty: 90 | Fill #0 | Status: AC

## 2020-05-13 ENCOUNTER — Other Ambulatory Visit (HOSPITAL_COMMUNITY): Payer: Self-pay

## 2020-05-16 ENCOUNTER — Other Ambulatory Visit (HOSPITAL_COMMUNITY): Payer: Self-pay

## 2020-05-22 ENCOUNTER — Ambulatory Visit: Payer: Self-pay | Admitting: Neurology

## 2020-05-27 ENCOUNTER — Ambulatory Visit: Payer: Self-pay | Admitting: Neurology

## 2020-05-30 ENCOUNTER — Ambulatory Visit: Payer: Self-pay | Admitting: Neurology

## 2020-06-05 ENCOUNTER — Ambulatory Visit: Payer: 59 | Admitting: Neurology

## 2020-06-05 DIAGNOSIS — G43709 Chronic migraine without aura, not intractable, without status migrainosus: Secondary | ICD-10-CM | POA: Diagnosis not present

## 2020-06-05 NOTE — Progress Notes (Signed)
Botox- 200 units x 1 vial Lot: [id number] Expiration: 01/2023 NDC: 0023-3921-02  Bacteriostatic 0.9% Sodium Chloride- 4mL total Lot: [id number] Expiration: 06/26/2021 NDC: 0409-1966-02  Dx: G43.709 B/B  

## 2020-06-05 NOTE — Progress Notes (Signed)
Consent Form Botulism Toxin Injection For Chronic Migraine   02/20/2020: She is still doing great. But it wore off, wasn;t as effective higher up in the forehead but be careful for forehead drooping we did it 3/4 up the forehead instead. +temporals. NO masseters. Will try nurtec.She loved nurtec has only had to use one. +5Orb Oculi. +Temporalis. Also +5U occipital emergence and +10 bilat U cervical paraspinals, ask her to show where to place the extras in the cervical muscles she has a few spots in her lower cervical spine - ask. She goes to [employer]  No orders of the defined types were placed in this encounter.   08/08/2019: She is still doing great. But it wore off, wasn;t as effective higher up in the forehead but be careful for forehead drooping we did it 3/4 up the forehead instead. +temporals. NO masseters.   Reviewed orally with patient, additionally signature is on file:  Botulism toxin has been approved by the Federal drug administration for treatment of chronic migraine. Botulism toxin does not cure chronic migraine and it may not be effective in some patients.  The administration of botulism toxin is accomplished by injecting a small amount of toxin into the muscles of the neck and head. Dosage must be titrated for each individual. Any benefits resulting from botulism toxin tend to wear off after 3 months with a repeat injection required if benefit is to be maintained. Injections are usually done every 3-4 months with maximum effect peak achieved by about 2 or 3 weeks. Botulism toxin is expensive and you should be sure of what costs you will incur resulting from the injection.  The side effects of botulism toxin use for chronic migraine may include:   -Transient, and usually mild, facial weakness with facial injections  -Transient, and usually mild, head or neck weakness with head/neck injections  -Reduction or loss of forehead facial animation due to forehead muscle  weakness  -Eyelid drooping  -Dry eye  -Pain at the site of injection or bruising at the site of injection  -Double vision  -Potential unknown long term risks  Contraindications: You should not have Botox if you are pregnant, nursing, allergic to albumin, have an infection, skin condition, or muscle weakness at the site of the injection, or have myasthenia gravis, Lambert-Eaton syndrome, or ALS.  It is also possible that as with any injection, there may be an allergic reaction or no effect from the medication. Reduced effectiveness after repeated injections is sometimes seen and rarely infection at the injection site may occur. All care will be taken to prevent these side effects. If therapy is given over a long time, atrophy and wasting in the muscle injected may occur. Occasionally the patient's become refractory to treatment because they develop antibodies to the toxin. In this event, therapy needs to be modified.  I have read the above information and consent to the administration of botulism toxin.    BOTOX PROCEDURE NOTE FOR MIGRAINE HEADACHE    Contraindications and precautions discussed with patient(above). Aseptic procedure was observed and patient tolerated procedure. Procedure performed by Dr. Georgia Dom  The condition has existed for more than 6 months, and pt does not have a diagnosis of ALS, Myasthenia Gravis or Lambert-Eaton Syndrome.  Risks and benefits of injections discussed and pt agrees to proceed with the procedure.  Written consent obtained  These injections are medically necessary. Pt  receives good benefits from these injections. These injections do not cause sedations or  hallucinations which the oral therapies may cause.  Description of procedure:  The patient was placed in a sitting position. The standard protocol was used for Botox as follows, with 5 units of Botox injected at each site:   -Procerus muscle, midline injection  -Corrugator muscle, bilateral  injection  -Frontalis muscle, bilateral injection, with 2 sites each side, medial injection was performed in the upper one third of the frontalis muscle, in the region vertical from the medial inferior edge of the superior orbital rim. The lateral injection was again in the upper one third of the forehead vertically above the lateral limbus of the cornea, 1.5 cm lateral to the medial injection site.  - Levator Scapulae: 5 units bilaterally  -Temporalis muscle injection, 5 sites, bilaterally. The first injection was 3 cm above the tragus of the ear, second injection site was 1.5 cm to 3 cm up from the first injection site in line with the tragus of the ear. The third injection site was 1.5-3 cm forward between the first 2 injection sites. The fourth injection site was 1.5 cm posterior to the second injection site. 5th site laterally in the temporalis  muscleat the level of the outer canthus.  - Patient feels her clenching is a trigger for headaches. +5 units masseter bilaterally   - Patient feels the migraines are centered around the eyes +5 units bilaterally at the outer canthus in the orbicularis occuli  -Occipitalis muscle injection, 3 sites, bilaterally. The first injection was done one half way between the occipital protuberance and the tip of the mastoid process behind the ear. The second injection site was done lateral and superior to the first, 1 fingerbreadth from the first injection. The third injection site was 1 fingerbreadth superiorly and medially from the first injection site.  -Cervical paraspinal muscle injection, 2 sites, bilateral knee first injection site was 1 cm from the midline of the cervical spine, 3 cm inferior to the lower border of the occipital protuberance. The second injection site was 1.5 cm superiorly and laterally to the first injection site.  -Trapezius muscle injection was performed at 3 sites, bilaterally. The first injection site was in the upper trapezius muscle  halfway between the inflection point of the neck, and the acromion. The second injection site was one half way between the acromion and the first injection site. The third injection was done between the first injection site and the inflection point of the neck.   Will return for repeat injection in 3 months.   A 200 unit sof Botox was used, any Botox not injected was wasted. The patient tolerated the procedure well, there were no complications of the above procedure.

## 2020-06-13 ENCOUNTER — Other Ambulatory Visit: Payer: Self-pay

## 2020-06-13 MED FILL — Thyroid Tab 15 MG (1/4 Grain): ORAL | 30 days supply | Qty: 30 | Fill #1 | Status: AC

## 2020-06-14 ENCOUNTER — Other Ambulatory Visit (HOSPITAL_COMMUNITY): Payer: Self-pay

## 2020-06-17 ENCOUNTER — Other Ambulatory Visit (HOSPITAL_COMMUNITY): Payer: Self-pay

## 2020-06-17 MED ORDER — ARMOUR THYROID 60 MG PO TABS
ORAL_TABLET | ORAL | 2 refills | Status: DC
  Filled 2020-06-17: qty 30, 30d supply, fill #0

## 2020-06-18 ENCOUNTER — Other Ambulatory Visit (HOSPITAL_COMMUNITY): Payer: Self-pay

## 2020-07-09 MED FILL — Thyroid Tab 15 MG (1/4 Grain): ORAL | 30 days supply | Qty: 30 | Fill #2 | Status: AC

## 2020-07-10 ENCOUNTER — Other Ambulatory Visit (HOSPITAL_COMMUNITY): Payer: Self-pay

## 2020-07-17 ENCOUNTER — Other Ambulatory Visit (HOSPITAL_COMMUNITY): Payer: Self-pay

## 2020-07-18 ENCOUNTER — Other Ambulatory Visit (HOSPITAL_COMMUNITY): Payer: Self-pay

## 2020-07-18 MED ORDER — ARMOUR THYROID 60 MG PO TABS
ORAL_TABLET | ORAL | 2 refills | Status: DC
  Filled 2020-07-18: qty 30, 30d supply, fill #0
  Filled 2020-08-12: qty 30, 30d supply, fill #1
  Filled 2020-09-18: qty 30, 30d supply, fill #2

## 2020-07-19 ENCOUNTER — Other Ambulatory Visit (HOSPITAL_COMMUNITY): Payer: Self-pay

## 2020-07-20 ENCOUNTER — Other Ambulatory Visit (HOSPITAL_COMMUNITY): Payer: Self-pay

## 2020-08-06 MED FILL — Thyroid Tab 15 MG (1/4 Grain): ORAL | 30 days supply | Qty: 30 | Fill #3 | Status: AC

## 2020-08-07 ENCOUNTER — Other Ambulatory Visit (HOSPITAL_COMMUNITY): Payer: Self-pay

## 2020-08-08 ENCOUNTER — Other Ambulatory Visit (HOSPITAL_COMMUNITY): Payer: Self-pay

## 2020-08-12 ENCOUNTER — Other Ambulatory Visit (HOSPITAL_COMMUNITY): Payer: Self-pay

## 2020-08-12 MED FILL — Losartan Potassium Tab 25 MG: ORAL | 90 days supply | Qty: 90 | Fill #1 | Status: AC

## 2020-08-14 ENCOUNTER — Other Ambulatory Visit: Payer: Self-pay

## 2020-08-14 ENCOUNTER — Encounter: Payer: Self-pay | Admitting: Family Medicine

## 2020-08-14 ENCOUNTER — Telehealth (INDEPENDENT_AMBULATORY_CARE_PROVIDER_SITE_OTHER): Payer: 59 | Admitting: Family Medicine

## 2020-08-14 VITALS — Ht 64.0 in

## 2020-08-14 DIAGNOSIS — L081 Erythrasma: Secondary | ICD-10-CM

## 2020-08-14 MED ORDER — ERYTHROMYCIN 2 % EX PADS
MEDICATED_PAD | CUTANEOUS | 0 refills | Status: DC
  Filled 2020-08-14: qty 60, 20d supply, fill #0

## 2020-08-14 MED ORDER — TRIAMCINOLONE ACETONIDE 0.1 % EX OINT
1.0000 "application " | TOPICAL_OINTMENT | Freq: Two times a day (BID) | CUTANEOUS | 0 refills | Status: DC
  Filled 2020-08-14: qty 30, 15d supply, fill #0

## 2020-08-14 NOTE — Progress Notes (Signed)
Established Patient Office Visit  Subjective:  Patient ID: Emily Barker, female    DOB: 1975-08-06  Age: 45 y.o. MRN: [id number]  CC:  Chief Complaint  Patient presents with   Rash    Rash at armpit x few months no improvement.     HPI Kanyah Matsushima presents for evaluation of axilla rash for 2 months after using husbands deodorant. Discontinued husbands deodorant 2 months ago. Tried nystatin powder without relief for a month without relief. Had been waxing up until two months ago. Now only using her deodorant. Doesn't shave.   Past Medical History:  Diagnosis Date   Abnormal Pap smear of cervix 2007   CIN 2 + HRHPV/ LEEP   Allergy    Anemia    Anxiety 2585   Complication of anesthesia    bp dropped with epidural   Contraception    husband vasectomy   Diverticulosis    Dysmenorrhea    GERD (gastroesophageal reflux disease)    Hashimoto's thyroiditis    Heart murmur    as a child   Hemangioma, intracranial structures ([place]) 2013   Left Frontal Lobe   Hepatitis A    Hypertension    Hypothyroid    right thyroid nodule   Migraines    Panic disorder 2012   Sinus tachycardia 2012   Vertigo     Past Surgical History:  Procedure Laterality Date   CERVICAL BIOPSY  W/ LOOP ELECTRODE EXCISION  2007   CIN 2   COLPOSCOPY  2007   CIN 2/ HRHPV   DILATION AND CURETTAGE OF UTERUS     SEPTOPLASTY     TONSILLECTOMY AND ADENOIDECTOMY  1988    Family History  Problem Relation Age of Onset   Prostate cancer Father 15       Metastatic CA to bones   Hypertension Father    Alcohol abuse Father    Heart disease Father    Cancer Father        Prostate   Hyperlipidemia Mother    Alcohol abuse Paternal Grandfather    Heart disease Paternal Grandfather    Dementia Maternal Grandmother    Heart disease Maternal Grandfather    Heart disease Paternal Grandmother    Hypertension Brother    Breast cancer Maternal Aunt    Depression Maternal Aunt    Diabetes Maternal Aunt     Heart attack Maternal Uncle    Heart disease Paternal Aunt    Depression Maternal Aunt    Diabetes Maternal Aunt    Heart attack Paternal Uncle    Migraines Neg Hx    Colon cancer Neg Hx    Esophageal cancer Neg Hx    Rectal cancer Neg Hx    Stomach cancer Neg Hx     Social History   Socioeconomic History   Marital status: Married    Spouse name: Lanny Hurst   Number of children: 3   Years of education: 16   Highest education level: Not on file  Occupational History   Occupation: [employer]  Tobacco Use   Smoking status: Never   Smokeless tobacco: Never  Vaping Use   Vaping Use: Never used  Substance and Sexual Activity   Alcohol use: Yes    Alcohol/week: 10.0 - 14.0 standard drinks    Types: 10 - 14 Glasses of wine per week    Comment: 1-2 five oz glasses of wine/day   Drug use: No   Sexual activity: Yes    Partners: Male  Birth control/protection: Other-see comments    Comment: vasectomy-husband  Other Topics Concern   Not on file  Social History Narrative   Lives with husband, children   Caffeine use:  1 cup coffee/day   Originally from [place], grew up in [place] Strain: Not on [employer] Insecurity: Not on file  Transportation Needs: Not on file  Physical Activity: Not on file  Stress: Not on file  Social Connections: Not on file  Intimate Partner Violence: Not on file    Outpatient Medications Prior to Visit  Medication Sig Dispense Refill   Cyanocobalamin (VITAMIN B-12 PO) Take by mouth.     fluticasone (FLONASE) 50 MCG/ACT nasal spray Place into both nostrils daily as needed.      loratadine (CLARITIN) 5 MG chewable tablet Chew 5 mg by mouth as needed.      losartan (COZAAR) 25 MG tablet TAKE 1 TABLET BY MOUTH ONCE A DAY 90 tablet 3   Multiple Vitamin (MULTIVITAMIN) tablet Take 1 tablet by mouth every other day.      Rimegepant Sulfate (NURTEC) 75 MG TBDP Take 75 mg by mouth daily as needed. For migraines.  Take as close to onset of migraine as possible. One daily maximum. 4 tablet 0   thyroid (ARMOUR THYROID) 60 MG tablet Take 1 tablet by mouth daily (take with 15mg  tablet for a total of 75mg  total daily as directed) 30 tablet 2   thyroid (ARMOUR) 15 MG tablet Take 1 tablet by mouth daily.     thyroid (ARMOUR) 15 MG tablet TAKE 1 TABLET BY MOUTH ONCE DAILY 30 tablet 5   thyroid (ARMOUR) 15 MG tablet TAKE 1 TABLET BY MOUTH ONCE DAILY 30 tablet 5   thyroid (ARMOUR) 60 MG tablet Take 1 tablet (60 mg total) by mouth daily before breakfast. 90 tablet 0   thyroid (ARMOUR) 60 MG tablet TAKE 1 TABLET BY MOUTH ONCE DAILY (FOR A TOTAL OF 75 MG DAILY AS DIRECTED) 30 tablet 5   Facility-Administered Medications Prior to Visit  Medication Dose Route Frequency Provider Last Rate Last Admin   0.9 %  sodium chloride infusion  500 mL Intravenous Once Nandigam, Kavitha V, MD        Allergies  Allergen Reactions   Other Hives, Itching, Palpitations and Rash    Hot flashes Hot flashes Hot flashes Hot flashes   Penicillin G Anaphylaxis, Rash and Nausea And Vomiting   Penicillins     Unknown. Other family members severely allergic. 1 family member had fatal reaction.    Iodinated Diagnostic Agents Hives   Miconazole Nitrate     Other reaction(s): Skin irritation   Monistat [Miconazole] Itching    Severe itching and burning.  No rash or shortness of breath.   Gluten Meal Palpitations    Other reaction(s): GI Symptoms   Latex Rash    itching   Sulfa Antibiotics Other (See Comments), Hives and Palpitations    Hot flashes, tachycardia     ROS Review of Systems  Constitutional:  Negative for diaphoresis, fatigue, fever and unexpected weight change.  Skin:  Positive for color change and rash.  Psychiatric/Behavioral: Negative.       Objective:    Physical Exam Vitals and nursing note reviewed.  Constitutional:      Appearance: Normal appearance.  Pulmonary:     Effort: Pulmonary effort is  normal.  Skin:      Neurological:  Mental Status: She is oriented to person, place, and time.  Psychiatric:        Mood and Affect: Mood normal.        Behavior: Behavior normal.    Ht 5\' 4"  (1.626 m)   BMI 31.48 kg/m  Wt Readings from Last 3 Encounters:  03/20/20 183 lb 6.4 oz (83.2 kg)  09/26/19 189 lb 6.4 oz (85.9 kg)  04/20/19 180 lb (81.6 kg)     Health Maintenance Due  Topic Date Due   HIV Screening  Never done   Hepatitis C Screening  Never done   TETANUS/TDAP  01/27/2019   PAP SMEAR-Modifier  07/12/2020    There are no preventive care reminders to display for this patient.  Lab Results  Component Value Date   TSH 1.80 03/20/2020   Lab Results  Component Value Date   WBC 5.2 09/06/2018   HGB 12.2 09/06/2018   HCT 37.3 09/06/2018   MCV 81.9 09/06/2018   PLT 241.0 09/06/2018   Lab Results  Component Value Date   NA 141 03/20/2020   K 4.8 03/20/2020   CO2 31 03/20/2020   GLUCOSE 96 03/20/2020   BUN 11 03/20/2020   CREATININE 0.78 03/20/2020   BILITOT 0.5 03/20/2020   ALKPHOS 58 03/20/2020   AST 14 03/20/2020   ALT 9 03/20/2020   PROT 7.1 03/20/2020   ALBUMIN 4.3 03/20/2020   CALCIUM 9.8 03/20/2020   ANIONGAP 4 (L) 03/12/2014   GFR 90.93 03/20/2020   Lab Results  Component Value Date   CHOL 209 (H) 03/20/2020   Lab Results  Component Value Date   HDL 60.50 03/20/2020   Lab Results  Component Value Date   LDLCALC 133 (H) 03/20/2020   Lab Results  Component Value Date   TRIG 76.0 03/20/2020   Lab Results  Component Value Date   CHOLHDL 3 03/20/2020   Lab Results  Component Value Date   HGBA1C 5.6 09/06/2018      Assessment & Plan:   Problem List Items Addressed This Visit   None Visit Diagnoses     Erythrasma    -  Primary   Relevant Medications   Erythromycin 2 % PADS   triamcinolone ointment (KENALOG) 0.1 %       Meds ordered this encounter  Medications   Erythromycin 2 % PADS    Sig: Apply to rash three  times daily.    Dispense:  60 each    Refill:  0   triamcinolone ointment (KENALOG) 0.1 %    Sig: Apply 1 application topically 2 (two) times daily.    Dispense:  30 g    Refill:  0    Follow-up: Return if symptoms worsen or fail to improve.  Patient requested to avoid oral antibiotic if possible.  We will try erythromycin patches 3 times daily followed by application of triamcinolone ointment twice daily.  Discussed dermatology referral if not improving.  Libby Maw, MD  Virtual Visit via Video Note  I connected with [name] on 08/14/20 at 10:30 AM EDT by a video enabled telemedicine application and verified that I am speaking with the correct person using two identifiers.  Location: Patient: at work alone in a room.  Provider: work   I discussed the limitations of evaluation and management by telemedicine and the availability of in person appointments. The patient expressed understanding and agreed to proceed.  History of Present Illness:    Observations/Objective:   Assessment and Plan:  Follow Up Instructions:    I discussed the assessment and treatment plan with the patient. The patient was provided an opportunity to ask questions and all were answered. The patient agreed with the plan and demonstrated an understanding of the instructions.   The patient was advised to call back or seek an in-person evaluation if the symptoms worsen or if the condition fails to improve as anticipated.  I provided 20 minutes of non-face-to-face time during this encounter.   Libby Maw, MD

## 2020-08-15 ENCOUNTER — Other Ambulatory Visit: Payer: Self-pay

## 2020-08-15 DIAGNOSIS — E063 Autoimmune thyroiditis: Secondary | ICD-10-CM | POA: Diagnosis not present

## 2020-08-15 LAB — TSH: TSH: 2.42 (ref <=5.90)

## 2020-09-05 ENCOUNTER — Telehealth: Payer: Self-pay | Admitting: Neurology

## 2020-09-05 NOTE — Telephone Encounter (Signed)
Patient has a Botox appointment 8/15. She has [employer], now requiring PA for Botox. I spoke with Barnetta Chapel @ [facility] [phone]) to create a case. Barnetta Chapel states PA for [phone] is pending for clinical notes. I faxed notes to [phone]. Reference #[id number].

## 2020-09-08 ENCOUNTER — Other Ambulatory Visit: Payer: Self-pay

## 2020-09-09 ENCOUNTER — Ambulatory Visit: Payer: 59 | Admitting: Neurology

## 2020-09-09 ENCOUNTER — Other Ambulatory Visit (HOSPITAL_COMMUNITY): Payer: Self-pay

## 2020-09-09 DIAGNOSIS — G43709 Chronic migraine without aura, not intractable, without status migrainosus: Secondary | ICD-10-CM

## 2020-09-09 NOTE — Progress Notes (Signed)
Botox- 200 units x 1 vial Lot: [id number] Expiration: 01/2023 NDC: [id number]  Bacteriostatic 0.9% Sodium Chloride- 105m total Lot: [id number]: 01/26/2022 NDC: [id number] Dx: [id number] B/B

## 2020-09-09 NOTE — Progress Notes (Signed)
Consent Form Botulism Toxin Injection For Chronic Migraine   02/20/2020: She is still doing great. But it wore off, wasn;t as effective higher up in the forehead but be careful for forehead drooping we did it 3/4 up the forehead instead. +temporals. NO masseters. Will try nurtec.She loved nurtec has only had to use one. +5Orb Oculi. +Temporalis. Also +5U occipital emergence and +10 bilat U cervical paraspinals, ask her to show where to place the extras in the cervical muscles she has a few spots in her lower cervical spine - ask. She goes to [employer]  No orders of the defined types were placed in this encounter.   08/08/2019: She is still doing great. But it wore off, wasn;t as effective higher up in the forehead but be careful for forehead drooping we did it 3/4 up the forehead instead. +temporals. NO masseters.   Reviewed orally with patient, additionally signature is on file:  Botulism toxin has been approved by the Federal drug administration for treatment of chronic migraine. Botulism toxin does not cure chronic migraine and it may not be effective in some patients.  The administration of botulism toxin is accomplished by injecting a small amount of toxin into the muscles of the neck and head. Dosage must be titrated for each individual. Any benefits resulting from botulism toxin tend to wear off after 3 months with a repeat injection required if benefit is to be maintained. Injections are usually done every 3-4 months with maximum effect peak achieved by about 2 or 3 weeks. Botulism toxin is expensive and you should be sure of what costs you will incur resulting from the injection.  The side effects of botulism toxin use for chronic migraine may include:   -Transient, and usually mild, facial weakness with facial injections  -Transient, and usually mild, head or neck weakness with head/neck injections  -Reduction or loss of forehead facial animation due to forehead muscle  weakness  -Eyelid drooping  -Dry eye  -Pain at the site of injection or bruising at the site of injection  -Double vision  -Potential unknown long term risks  Contraindications: You should not have Botox if you are pregnant, nursing, allergic to albumin, have an infection, skin condition, or muscle weakness at the site of the injection, or have myasthenia gravis, Lambert-Eaton syndrome, or ALS.  It is also possible that as with any injection, there may be an allergic reaction or no effect from the medication. Reduced effectiveness after repeated injections is sometimes seen and rarely infection at the injection site may occur. All care will be taken to prevent these side effects. If therapy is given over a long time, atrophy and wasting in the muscle injected may occur. Occasionally the patient's become refractory to treatment because they develop antibodies to the toxin. In this event, therapy needs to be modified.  I have read the above information and consent to the administration of botulism toxin.    BOTOX PROCEDURE NOTE FOR MIGRAINE HEADACHE    Contraindications and precautions discussed with patient(above). Aseptic procedure was observed and patient tolerated procedure. Procedure performed by Dr. Georgia Dom  The condition has existed for more than 6 months, and pt does not have a diagnosis of ALS, Myasthenia Gravis or Lambert-Eaton Syndrome.  Risks and benefits of injections discussed and pt agrees to proceed with the procedure.  Written consent obtained  These injections are medically necessary. Pt  receives good benefits from these injections. These injections do not cause sedations or  hallucinations which the oral therapies may cause.  Description of procedure:  The patient was placed in a sitting position. The standard protocol was used for Botox as follows, with 5 units of Botox injected at each site:   -Procerus muscle, midline injection  -Corrugator muscle, bilateral  injection  -Frontalis muscle, bilateral injection, with 2 sites each side, medial injection was performed in the upper one third of the frontalis muscle, in the region vertical from the medial inferior edge of the superior orbital rim. The lateral injection was again in the upper one third of the forehead vertically above the lateral limbus of the cornea, 1.5 cm lateral to the medial injection site.  - Levator Scapulae: 5 units bilaterally  -Temporalis muscle injection, 5 sites, bilaterally. The first injection was 3 cm above the tragus of the ear, second injection site was 1.5 cm to 3 cm up from the first injection site in line with the tragus of the ear. The third injection site was 1.5-3 cm forward between the first 2 injection sites. The fourth injection site was 1.5 cm posterior to the second injection site. 5th site laterally in the temporalis  muscleat the level of the outer canthus.  - Patient feels her clenching is a trigger for headaches. +5 units masseter bilaterally   - Patient feels the migraines are centered around the eyes +5 units bilaterally at the outer canthus in the orbicularis occuli  -Occipitalis muscle injection, 3 sites, bilaterally. The first injection was done one half way between the occipital protuberance and the tip of the mastoid process behind the ear. The second injection site was done lateral and superior to the first, 1 fingerbreadth from the first injection. The third injection site was 1 fingerbreadth superiorly and medially from the first injection site.  -Cervical paraspinal muscle injection, 2 sites, bilateral knee first injection site was 1 cm from the midline of the cervical spine, 3 cm inferior to the lower border of the occipital protuberance. The second injection site was 1.5 cm superiorly and laterally to the first injection site.  -Trapezius muscle injection was performed at 3 sites, bilaterally. The first injection site was in the upper trapezius muscle  halfway between the inflection point of the neck, and the acromion. The second injection site was one half way between the acromion and the first injection site. The third injection was done between the first injection site and the inflection point of the neck.   Will return for repeat injection in 3 months.   A 200 unit sof Botox was used, any Botox not injected was wasted. The patient tolerated the procedure well, there were no complications of the above procedure.

## 2020-09-11 ENCOUNTER — Other Ambulatory Visit (HOSPITAL_COMMUNITY): Payer: Self-pay

## 2020-09-11 MED ORDER — ARMOUR THYROID 15 MG PO TABS
15.0000 mg | ORAL_TABLET | Freq: Every day | ORAL | 5 refills | Status: DC
  Filled 2020-09-11 – 2020-09-18 (×2): qty 30, 30d supply, fill #0

## 2020-09-11 MED ORDER — ARMOUR THYROID 15 MG PO TABS
ORAL_TABLET | ORAL | 3 refills | Status: DC
  Filled 2020-09-11: qty 90, 90d supply, fill #0
  Filled 2020-12-10: qty 90, 90d supply, fill #1
  Filled 2021-03-09: qty 90, 90d supply, fill #2
  Filled 2021-06-08: qty 90, 90d supply, fill #3

## 2020-09-12 ENCOUNTER — Other Ambulatory Visit (HOSPITAL_COMMUNITY): Payer: Self-pay

## 2020-09-17 NOTE — Telephone Encounter (Signed)
Received call from [place] with [facility]. She called to advise [place] of Botox approval. PA #[id number] (09/05/20- 03/08/21). She states this approval is good for 2 visits.

## 2020-09-18 ENCOUNTER — Other Ambulatory Visit: Payer: Self-pay

## 2020-09-26 ENCOUNTER — Ambulatory Visit: Payer: 59 | Admitting: Obstetrics and Gynecology

## 2020-09-26 ENCOUNTER — Encounter: Payer: Self-pay | Admitting: Family Medicine

## 2020-09-26 ENCOUNTER — Other Ambulatory Visit: Payer: Self-pay

## 2020-09-26 ENCOUNTER — Ambulatory Visit (INDEPENDENT_AMBULATORY_CARE_PROVIDER_SITE_OTHER): Payer: 59 | Admitting: Family Medicine

## 2020-09-26 ENCOUNTER — Ambulatory Visit (INDEPENDENT_AMBULATORY_CARE_PROVIDER_SITE_OTHER): Payer: 59

## 2020-09-26 VITALS — BP 134/86 | HR 99 | Temp 97.7°F | Ht 64.0 in | Wt 188.0 lb

## 2020-09-26 DIAGNOSIS — S93509A Unspecified sprain of unspecified toe(s), initial encounter: Secondary | ICD-10-CM | POA: Insufficient documentation

## 2020-09-26 DIAGNOSIS — M79671 Pain in right foot: Secondary | ICD-10-CM

## 2020-09-26 DIAGNOSIS — Z0000 Encounter for general adult medical examination without abnormal findings: Secondary | ICD-10-CM | POA: Insufficient documentation

## 2020-09-26 DIAGNOSIS — Z23 Encounter for immunization: Secondary | ICD-10-CM | POA: Diagnosis not present

## 2020-09-26 DIAGNOSIS — S99921A Unspecified injury of right foot, initial encounter: Secondary | ICD-10-CM | POA: Diagnosis not present

## 2020-09-26 MED ORDER — DICLOFENAC SODIUM 1 % EX GEL
CUTANEOUS | 1 refills | Status: DC
  Filled 2020-09-26: qty 100, 10d supply, fill #0

## 2020-09-26 NOTE — Progress Notes (Signed)
Established Patient Office Visit  Subjective:  Patient ID: Emily Barker, female    DOB: 1975/10/23  Age: 45 y.o. MRN: [id number]  CC:  Chief Complaint  Patient presents with   Foot Pain    Possible right foot fracture patient injured foot about 2 months ago still painful with some swelling that come and go.     HPI [name] presents for evaluation of ongoing tenderness in her lateral right foot.  She stopped her right fifth toe about 2 months ago.  The area remains a little swollen and intermittently tender particularly in the ball of the foot and when she rotates her little toe.  Past Medical History:  Diagnosis Date   Abnormal Pap smear of cervix 2007   CIN 2 + HRHPV/ LEEP   Allergy    Anemia    Anxiety 0000000   Complication of anesthesia    bp dropped with epidural   Contraception    husband vasectomy   Diverticulosis    Dysmenorrhea    GERD (gastroesophageal reflux disease)    Hashimoto's thyroiditis    Heart murmur    as a child   Hemangioma, intracranial structures ([place]) 2013   Left Frontal Lobe   Hepatitis A    Hypertension    Hypothyroid    right thyroid nodule   Migraines    Panic disorder 2012   Sinus tachycardia 2012   Vertigo     Past Surgical History:  Procedure Laterality Date   CERVICAL BIOPSY  W/ LOOP ELECTRODE EXCISION  2007   CIN 2   COLPOSCOPY  2007   CIN 2/ HRHPV   DILATION AND CURETTAGE OF UTERUS     SEPTOPLASTY     TONSILLECTOMY AND ADENOIDECTOMY  1988    Family History  Problem Relation Age of Onset   Prostate cancer Father 24       Metastatic CA to bones   Hypertension Father    Alcohol abuse Father    Heart disease Father    Cancer Father        Prostate   Hyperlipidemia Mother    Alcohol abuse Paternal Grandfather    Heart disease Paternal Grandfather    Dementia Maternal Grandmother    Heart disease Maternal Grandfather    Heart disease Paternal Grandmother    Hypertension Brother    Breast cancer Maternal Aunt     Depression Maternal Aunt    Diabetes Maternal Aunt    Heart attack Maternal Uncle    Heart disease Paternal Aunt    Depression Maternal Aunt    Diabetes Maternal Aunt    Heart attack Paternal Uncle    Migraines Neg Hx    Colon cancer Neg Hx    Esophageal cancer Neg Hx    Rectal cancer Neg Hx    Stomach cancer Neg Hx     Social History   Socioeconomic History   Marital status: Married    Spouse name: Lanny Hurst   Number of children: 3   Years of education: 16   Highest education level: Not on file  Occupational History   Occupation: [employer]  Tobacco Use   Smoking status: Never   Smokeless tobacco: Never  Vaping Use   Vaping Use: Never used  Substance and Sexual Activity   Alcohol use: Yes    Alcohol/week: 10.0 - 14.0 standard drinks    Types: 10 - 14 Glasses of wine per week    Comment: 1-2 five oz glasses of wine/day  Drug use: No   Sexual activity: Yes    Partners: Male    Birth control/protection: Other-see comments    Comment: vasectomy-husband  Other Topics Concern   Not on file  Social History Narrative   Lives with husband, children   Caffeine use:  1 cup coffee/day   Originally from [place], grew up in [place] Strain: Not on [employer] Insecurity: Not on file  Transportation Needs: Not on file  Physical Activity: Not on file  Stress: Not on file  Social Connections: Not on file  Intimate Partner Violence: Not on file    Outpatient Medications Prior to Visit  Medication Sig Dispense Refill   Erythromycin 2 % PADS Apply to rash three times daily. 60 each 0   loratadine (CLARITIN) 5 MG chewable tablet Chew 5 mg by mouth as needed.      losartan (COZAAR) 25 MG tablet TAKE 1 TABLET BY MOUTH ONCE A DAY 90 tablet 3   Multiple Vitamin (MULTIVITAMIN) tablet Take 1 tablet by mouth every other day.      Rimegepant Sulfate (NURTEC) 75 MG TBDP Take 75 mg by mouth daily as needed. For migraines. Take as close to onset  of migraine as possible. One daily maximum. 4 tablet 0   thyroid (ARMOUR THYROID) 15 MG tablet TAKE 1 TABLET BY MOUTH ONCE DAILY 30 tablet 5   thyroid (ARMOUR THYROID) 15 MG tablet Take one tablet (15 mg dose) by mouth daily. 90 tablet 3   thyroid (ARMOUR THYROID) 60 MG tablet Take 1 tablet by mouth daily (take with '15mg'$  tablet for a total of '75mg'$  total daily as directed) 30 tablet 2   thyroid (ARMOUR) 15 MG tablet Take 1 tablet by mouth daily.     thyroid (ARMOUR) 60 MG tablet Take 1 tablet (60 mg total) by mouth daily before breakfast. 90 tablet 0   triamcinolone ointment (KENALOG) 0.1 % Apply 1 application topically 2 (two) times daily. 30 g 0   Cyanocobalamin (VITAMIN B-12 PO) Take by mouth. (Patient not taking: Reported on 09/26/2020)     fluticasone (FLONASE) 50 MCG/ACT nasal spray Place into both nostrils daily as needed.  (Patient not taking: Reported on 09/26/2020)     thyroid (ARMOUR) 15 MG tablet TAKE 1 TABLET BY MOUTH ONCE DAILY 30 tablet 5   thyroid (ARMOUR) 60 MG tablet TAKE 1 TABLET BY MOUTH ONCE DAILY (FOR A TOTAL OF 75 MG DAILY AS DIRECTED) 30 tablet 5   Facility-Administered Medications Prior to Visit  Medication Dose Route Frequency Provider Last Rate Last Admin   0.9 %  sodium chloride infusion  500 mL Intravenous Once Nandigam, Kavitha V, MD        Allergies  Allergen Reactions   Other Hives, Itching, Palpitations and Rash    Hot flashes Hot flashes Hot flashes Hot flashes   Penicillin G Anaphylaxis, Rash and Nausea And Vomiting   Penicillins     Unknown. Other family members severely allergic. 1 family member had fatal reaction.    Iodinated Diagnostic Agents Hives   Miconazole Nitrate     Other reaction(s): Skin irritation   Monistat [Miconazole] Itching    Severe itching and burning.  No rash or shortness of breath.   Gluten Meal Palpitations    Other reaction(s): GI Symptoms   Latex Rash    itching   Sulfa Antibiotics Other (See Comments), Hives and  Palpitations    Hot flashes, tachycardia  ROS Review of Systems  Constitutional:  Negative for diaphoresis, fatigue, fever and unexpected weight change.  Musculoskeletal:  Positive for arthralgias and joint swelling.  Psychiatric/Behavioral: Negative.       Objective:    Physical Exam Vitals and nursing note reviewed.  Constitutional:      Appearance: Normal appearance.  HENT:     Right Ear: External ear normal.     Left Ear: External ear normal.  Pulmonary:     Effort: Pulmonary effort is normal.  Musculoskeletal:     Right foot: Normal range of motion. Swelling and tenderness present. No prominent metatarsal heads.       Legs:  Skin:    General: Skin is warm and dry.  Neurological:     Mental Status: She is alert and oriented to person, place, and time.  Psychiatric:        Mood and Affect: Mood normal.        Behavior: Behavior normal.    BP 134/86 (BP Location: Right Arm, Patient Position: Sitting, Cuff Size: Large)   Pulse 99   Temp 97.7 F (36.5 C) (Temporal)   Ht '5\' 4"'$  (1.626 m)   Wt 188 lb (85.3 kg)   SpO2 97%   BMI 32.27 kg/m  Wt Readings from Last 3 Encounters:  09/26/20 188 lb (85.3 kg)  03/20/20 183 lb 6.4 oz (83.2 kg)  09/26/19 189 lb 6.4 oz (85.9 kg)     Health Maintenance Due  Topic Date Due   HIV Screening  Never done   Hepatitis C Screening  Never done   TETANUS/TDAP  01/27/2019   PAP SMEAR-Modifier  07/12/2020    There are no preventive care reminders to display for this patient.  Lab Results  Component Value Date   TSH 2.42 08/15/2020   Lab Results  Component Value Date   WBC 5.2 09/06/2018   HGB 12.2 09/06/2018   HCT 37.3 09/06/2018   MCV 81.9 09/06/2018   PLT 241.0 09/06/2018   Lab Results  Component Value Date   NA 141 03/20/2020   K 4.8 03/20/2020   CO2 31 03/20/2020   GLUCOSE 96 03/20/2020   BUN 11 03/20/2020   CREATININE 0.78 03/20/2020   BILITOT 0.5 03/20/2020   ALKPHOS 58 03/20/2020   AST 14 03/20/2020    ALT 9 03/20/2020   PROT 7.1 03/20/2020   ALBUMIN 4.3 03/20/2020   CALCIUM 9.8 03/20/2020   ANIONGAP 4 (L) 03/12/2014   GFR 90.93 03/20/2020   Lab Results  Component Value Date   CHOL 209 (H) 03/20/2020   Lab Results  Component Value Date   HDL 60.50 03/20/2020   Lab Results  Component Value Date   LDLCALC 133 (H) 03/20/2020   Lab Results  Component Value Date   TRIG 76.0 03/20/2020   Lab Results  Component Value Date   CHOLHDL 3 03/20/2020   Lab Results  Component Value Date   HGBA1C 5.6 09/06/2018      Assessment & Plan:   Problem List Items Addressed This Visit       Musculoskeletal and Integument   Sprain of toe   Relevant Medications   diclofenac Sodium (VOLTAREN) 1 % GEL     Other   Right foot pain   Relevant Medications   diclofenac Sodium (VOLTAREN) 1 % GEL   Other Relevant Orders   DG Foot Complete Right   Healthcare maintenance - Primary   Relevant Orders   Flu Vaccine QUAD 6+ mos PF IM (  Fluarix Quad PF) (Completed)    Meds ordered this encounter  Medications   diclofenac Sodium (VOLTAREN) 1 % GEL    Sig: Apply a pea sized amount every 8 hours as needed.    Dispense:  150 g    Refill:  1     Follow-up: Return if symptoms worsen or fail to improve, for Ortho referral if not improving. Libby Maw, MD

## 2020-09-26 NOTE — Progress Notes (Deleted)
45 y.o. [id number] Married Hispanic female here for annual exam.    PCP:     No LMP recorded.           Sexually active: {yes no:314532}  The current method of family planning is vasectomy.    Exercising: {yes no:314532}  {types:19826} Smoker:  no  Health Maintenance: Pap:   07-12-17 Neg:Neg HR HPV,03-13-15 Neg:Neg HR HPV, 02-27-13 Neg:Neg HR HPV History of abnormal Pap:  yes,  2007 Hx colposcopy and LEEP procedure--CIN 2 with Pos. HR HPV MMG: 02-22-20 3D/Neg/BiRads1 Colonoscopy:   12/2017 normal--hemorrhoids;next 10 years BMD:   n/a  Result  n/a TDaP:  PCP Gardasil:   no HIV: Neg in pregnancy Hep C: Unsure Screening Labs:  Hb today: ***, Urine today: ***   reports that she has never smoked. She has never used smokeless tobacco. She reports current alcohol use of about 10.0 - 14.0 standard drinks per week. She reports that she does not use drugs.  Past Medical History:  Diagnosis Date   Abnormal Pap smear of cervix 2007   CIN 2 + HRHPV/ LEEP   Allergy    Anemia    Anxiety 0000000   Complication of anesthesia    bp dropped with epidural   Contraception    husband vasectomy   Diverticulosis    Dysmenorrhea    GERD (gastroesophageal reflux disease)    Hashimoto's thyroiditis    Heart murmur    as a child   Hemangioma, intracranial structures ([place]) 2013   Left Frontal Lobe   Hepatitis A    Hypertension    Hypothyroid    right thyroid nodule   Migraines    Panic disorder 2012   Sinus tachycardia 2012   Vertigo     Past Surgical History:  Procedure Laterality Date   CERVICAL BIOPSY  W/ LOOP ELECTRODE EXCISION  2007   CIN 2   COLPOSCOPY  2007   CIN 2/ HRHPV   DILATION AND CURETTAGE OF UTERUS     SEPTOPLASTY     TONSILLECTOMY AND ADENOIDECTOMY  1988    Current Outpatient Medications  Medication Sig Dispense Refill   Cyanocobalamin (VITAMIN B-12 PO) Take by mouth. (Patient not taking: Reported on 09/26/2020)     Erythromycin 2 % PADS Apply to rash three times daily. 60  each 0   fluticasone (FLONASE) 50 MCG/ACT nasal spray Place into both nostrils daily as needed.  (Patient not taking: Reported on 09/26/2020)     loratadine (CLARITIN) 5 MG chewable tablet Chew 5 mg by mouth as needed.      losartan (COZAAR) 25 MG tablet TAKE 1 TABLET BY MOUTH ONCE A DAY 90 tablet 3   Multiple Vitamin (MULTIVITAMIN) tablet Take 1 tablet by mouth every other day.      Rimegepant Sulfate (NURTEC) 75 MG TBDP Take 75 mg by mouth daily as needed. For migraines. Take as close to onset of migraine as possible. One daily maximum. 4 tablet 0   thyroid (ARMOUR THYROID) 15 MG tablet TAKE 1 TABLET BY MOUTH ONCE DAILY 30 tablet 5   thyroid (ARMOUR THYROID) 15 MG tablet Take one tablet (15 mg dose) by mouth daily. 90 tablet 3   thyroid (ARMOUR THYROID) 60 MG tablet Take 1 tablet by mouth daily (take with '15mg'$  tablet for a total of '75mg'$  total daily as directed) 30 tablet 2   thyroid (ARMOUR) 15 MG tablet Take 1 tablet by mouth daily.     thyroid (ARMOUR) 15 MG tablet TAKE  1 TABLET BY MOUTH ONCE DAILY 30 tablet 5   thyroid (ARMOUR) 60 MG tablet Take 1 tablet (60 mg total) by mouth daily before breakfast. 90 tablet 0   thyroid (ARMOUR) 60 MG tablet TAKE 1 TABLET BY MOUTH ONCE DAILY (FOR A TOTAL OF 75 MG DAILY AS DIRECTED) 30 tablet 5   triamcinolone ointment (KENALOG) 0.1 % Apply 1 application topically 2 (two) times daily. 30 g 0   Current Facility-Administered Medications  Medication Dose Route Frequency Provider Last Rate Last Admin   0.9 %  sodium chloride infusion  500 mL Intravenous Once Nandigam, Venia Minks, MD        Family History  Problem Relation Age of Onset   Prostate cancer Father 97       Metastatic CA to bones   Hypertension Father    Alcohol abuse Father    Heart disease Father    Cancer Father        Prostate   Hyperlipidemia Mother    Alcohol abuse Paternal Grandfather    Heart disease Paternal Grandfather    Dementia Maternal Grandmother    Heart disease Maternal  Grandfather    Heart disease Paternal Grandmother    Hypertension Brother    Breast cancer Maternal Aunt    Depression Maternal Aunt    Diabetes Maternal Aunt    Heart attack Maternal Uncle    Heart disease Paternal Aunt    Depression Maternal Aunt    Diabetes Maternal Aunt    Heart attack Paternal Uncle    Migraines Neg Hx    Colon cancer Neg Hx    Esophageal cancer Neg Hx    Rectal cancer Neg Hx    Stomach cancer Neg Hx     Review of Systems  Exam:   There were no vitals taken for this visit.    General appearance: alert, cooperative and appears stated age Head: normocephalic, without obvious abnormality, atraumatic Neck: no adenopathy, supple, symmetrical, trachea midline and thyroid normal to inspection and palpation Lungs: clear to auscultation bilaterally Breasts: normal appearance, no masses or tenderness, No nipple retraction or dimpling, No nipple discharge or bleeding, No axillary adenopathy Heart: regular rate and rhythm Abdomen: soft, non-tender; no masses, no organomegaly Extremities: extremities normal, atraumatic, no cyanosis or edema Skin: skin color, texture, turgor normal. No rashes or lesions Lymph nodes: cervical, supraclavicular, and axillary nodes normal. Neurologic: grossly normal  Pelvic: External genitalia:  no lesions              No abnormal inguinal nodes palpated.              Urethra:  normal appearing urethra with no masses, tenderness or lesions              Bartholins and Skenes: normal                 Vagina: normal appearing vagina with normal color and discharge, no lesions              Cervix: no lesions              Pap taken: {yes no:314532} Bimanual Exam:  Uterus:  normal size, contour, position, consistency, mobility, non-tender              Adnexa: no mass, fullness, tenderness              Rectal exam: {yes no:314532}.  Confirms.  Anus:  normal sphincter tone, no lesions  Chaperone was present for exam:   ***  Assessment:   Well woman visit with gynecologic exam.   Plan: Mammogram screening discussed. Self breast awareness reviewed. Pap and HR HPV as above. Guidelines for Calcium, Vitamin D, regular exercise program including cardiovascular and weight bearing exercise.   Follow up annually and prn.   Additional counseling given.  {yes [id number]. _______ minutes face to face time of which over 50% was spent in counseling.    After visit summary provided.

## 2020-10-07 ENCOUNTER — Other Ambulatory Visit: Payer: Self-pay

## 2020-10-07 ENCOUNTER — Encounter: Payer: Self-pay | Admitting: Nurse Practitioner

## 2020-10-07 NOTE — Telephone Encounter (Signed)
Called and spoke to patient. All concerns have been resolved. Sw, cma

## 2020-10-08 ENCOUNTER — Ambulatory Visit: Payer: 59 | Admitting: Nurse Practitioner

## 2020-10-15 ENCOUNTER — Other Ambulatory Visit (HOSPITAL_COMMUNITY): Payer: Self-pay

## 2020-10-16 ENCOUNTER — Other Ambulatory Visit (HOSPITAL_COMMUNITY): Payer: Self-pay

## 2020-10-16 MED ORDER — ARMOUR THYROID 60 MG PO TABS
ORAL_TABLET | ORAL | 2 refills | Status: DC
  Filled 2020-10-16: qty 30, 30d supply, fill #0
  Filled 2020-11-12: qty 30, 30d supply, fill #1
  Filled 2020-12-15: qty 30, 30d supply, fill #2

## 2020-10-17 ENCOUNTER — Other Ambulatory Visit (HOSPITAL_COMMUNITY): Payer: Self-pay

## 2020-10-18 ENCOUNTER — Other Ambulatory Visit (HOSPITAL_COMMUNITY): Payer: Self-pay

## 2020-11-11 DIAGNOSIS — Z0289 Encounter for other administrative examinations: Secondary | ICD-10-CM

## 2020-11-12 DIAGNOSIS — H5203 Hypermetropia, bilateral: Secondary | ICD-10-CM | POA: Diagnosis not present

## 2020-11-12 DIAGNOSIS — H52223 Regular astigmatism, bilateral: Secondary | ICD-10-CM | POA: Diagnosis not present

## 2020-11-12 DIAGNOSIS — H524 Presbyopia: Secondary | ICD-10-CM | POA: Diagnosis not present

## 2020-11-12 DIAGNOSIS — Z135 Encounter for screening for eye and ear disorders: Secondary | ICD-10-CM | POA: Diagnosis not present

## 2020-11-12 MED FILL — Losartan Potassium Tab 25 MG: ORAL | 90 days supply | Qty: 90 | Fill #2 | Status: AC

## 2020-11-13 ENCOUNTER — Telehealth: Payer: Self-pay | Admitting: *Deleted

## 2020-11-13 ENCOUNTER — Other Ambulatory Visit (HOSPITAL_COMMUNITY): Payer: Self-pay

## 2020-11-13 NOTE — Telephone Encounter (Signed)
Pt Matrix from faxed on 11/13/20 to [facility]. I email copy to pt.

## 2020-11-13 NOTE — Progress Notes (Signed)
45 y.o. [id number] Married Hispanic female here for annual exam.    Has concerns today. Wondering if she needs hormonal treatment.   Working out regularly, and eating well.  Wanting to loose weight.   Menses are lighter.   She has muscle fatigue with exercise, but she pushes through this.  She notices muscle fatigue walking up stairs also.  Increased fatigue.  Sleeping well.  Grinding her teeth.  States normal thyroid function.  Hot all the time but not having as many hot flashes.   She is waiting for a referral to medical Weight Management group.  Leaking urine with exercise.  Also has urinary frequency.  PCP:  Wilfred Lacy, NP  Patient's last menstrual period was 11/09/2020 (exact date).     Period Cycle (Days): 30 Period Duration (Days): 4-5 Period Pattern: Regular Menstrual Flow:  (heavy day 2 & 3) Menstrual Control: Maxi pad Menstrual Control Change Freq (Hours): changes maxi pad every 4 hours on heaviest day Dysmenorrhea: (!) Moderate Dysmenorrhea Symptoms: Cramping, Headache, Other (Comment) (Migraines w/cycles)     Sexually active: Yes.    The current method of family planning is vasectomy.    Exercising: Yes.     weights Smoker:  no  Health Maintenance: Pap:   07-12-17 Neg:Neg HR HPV,03-13-15 Neg:Neg HR HPV, 02-27-13 Neg:Neg HR HPV History of abnormal Pap:  yes,  2007 Hx colposcopy and LEEP procedure--CIN 2 with Pos. HR HPV MMG: 02-22-20  3D/Neg/BiRads1 Colonoscopy: 01-21-18 normal--hemorrhoids;next 10 years. BMD:   n/a  Result  n/a TDaP:  2011 Gardasil:   no HIV: Neg in preg Hep C: ?Neg in preg Screening Labs:  PCP and Rheumatology.  Flu vaccine:  completed.   reports that she has never smoked. She has never used smokeless tobacco. She reports current alcohol use of about 10.0 - 14.0 standard drinks per week. She reports that she does not use drugs.  Past Medical History:  Diagnosis Date   Abnormal Pap smear of cervix 2007   CIN 2 + HRHPV/ LEEP    Allergy    Anemia    Anxiety 4540   Complication of anesthesia    bp dropped with epidural   Contraception    husband vasectomy   Diverticulosis    Dysmenorrhea    GERD (gastroesophageal reflux disease)    Hashimoto's thyroiditis    Heart murmur    as a child   Hemangioma, intracranial structures ([place]) 2013   Left Frontal Lobe   Hepatitis A    Hypertension    Hypothyroid    right thyroid nodule   Migraines    Panic disorder 2012   Sinus tachycardia 2012   Vertigo     Past Surgical History:  Procedure Laterality Date   CERVICAL BIOPSY  W/ LOOP ELECTRODE EXCISION  2007   CIN 2   COLPOSCOPY  2007   CIN 2/ HRHPV   DILATION AND CURETTAGE OF UTERUS     SEPTOPLASTY     TONSILLECTOMY AND ADENOIDECTOMY  1988    Current Outpatient Medications  Medication Sig Dispense Refill   thyroid (ARMOUR THYROID) 60 MG tablet Take 1 tablet by mouth daily (take with 15mg  tablet for a total of 75mg  total daily as directed) 30 tablet 2   fluticasone (FLONASE) 50 MCG/ACT nasal spray Place into both nostrils daily as needed.  (Patient not taking: Reported on 09/26/2020)     loratadine (CLARITIN) 5 MG chewable tablet Chew 5 mg by mouth as needed.      losartan (  COZAAR) 25 MG tablet TAKE 1 TABLET BY MOUTH ONCE A DAY 90 tablet 3   Multiple Vitamin (MULTIVITAMIN) tablet Take 1 tablet by mouth every other day.      Rimegepant Sulfate (NURTEC) 75 MG TBDP Take 75 mg by mouth daily as needed. For migraines. Take as close to onset of migraine as possible. One daily maximum. 4 tablet 0   thyroid (ARMOUR THYROID) 15 MG tablet TAKE 1 TABLET BY MOUTH ONCE DAILY 30 tablet 5   thyroid (ARMOUR THYROID) 15 MG tablet Take one tablet (15 mg dose) by mouth daily. 90 tablet 3   triamcinolone ointment (KENALOG) 0.1 % Apply 1 application topically 2 (two) times daily. 30 g 0   Current Facility-Administered Medications  Medication Dose Route Frequency Provider Last Rate Last Admin   0.9 %  sodium chloride infusion  500  mL Intravenous Once Nandigam, Venia Minks, MD        Family History  Problem Relation Age of Onset   Prostate cancer Father 74       Metastatic CA to bones   Hypertension Father    Alcohol abuse Father    Heart disease Father    Cancer Father        Prostate   Hyperlipidemia Mother    Alcohol abuse Paternal Grandfather    Heart disease Paternal Grandfather    Dementia Maternal Grandmother    Heart disease Maternal Grandfather    Heart disease Paternal Grandmother    Hypertension Brother    Breast cancer Maternal Aunt    Depression Maternal Aunt    Diabetes Maternal Aunt    Heart attack Maternal Uncle    Heart disease Paternal Aunt    Depression Maternal Aunt    Diabetes Maternal Aunt    Heart attack Paternal Uncle    Migraines Neg Hx    Colon cancer Neg Hx    Esophageal cancer Neg Hx    Rectal cancer Neg Hx    Stomach cancer Neg Hx     Review of Systems  All other systems reviewed and are negative.  Exam:   BP 130/82   Pulse 78   Ht 5' 3.5" (1.613 m)   Wt 189 lb (85.7 kg)   LMP 11/09/2020 (Exact Date)   SpO2 99%   BMI 32.95 kg/m     General appearance: alert, cooperative and appears stated age Head: normocephalic, without obvious abnormality, atraumatic Neck: no adenopathy, supple, symmetrical, trachea midline and thyroid normal to inspection and palpation Lungs: clear to auscultation bilaterally Breasts: normal appearance, no masses or tenderness, No nipple retraction or dimpling, No nipple discharge or bleeding, No axillary adenopathy Heart: regular rate and rhythm Abdomen: soft, non-tender; no masses, no organomegaly Extremities: extremities normal, atraumatic, no cyanosis or edema Skin: skin color, texture, turgor normal. No rashes or lesions Lymph nodes: cervical, supraclavicular, and axillary nodes normal. Neurologic: grossly normal  Pelvic: External genitalia:  no lesions              No abnormal inguinal nodes palpated.              Urethra:  normal  appearing urethra with no masses, tenderness or lesions              Bartholins and Skenes: normal                 Vagina: normal appearing vagina with normal color and discharge, no lesions  Cervix: no lesions.  Mild menstrual flow from the os.               Pap taken: yes Bimanual Exam:  Uterus:  normal size, contour, position, consistency, mobility, non-tender              Adnexa: no mass, fullness, tenderness              Rectal exam: yes.  Confirms.              Anus:  normal sphincter tone, no lesions  Chaperone was present for exam:  Estill Bamberg, CMA Assessment:   Well woman visit with gynecologic exam. Hx LEEP.  Stress incontinence.  Urinary urgency.  Migraine HA without aura. HTN. Intracranial hemangioma. Difficulty with weight control. Muscle fatigue.   Plan: Mammogram screening discussed. Self breast awareness reviewed. Pap and HR HPV collected. Guidelines for Calcium, Vitamin D, regular exercise program including cardiovascular and weight bearing exercise. We briefly discussed referral to pelvic floor therapy versus surgery.  She [facility] to [facility] Pel[facility].  I do not recommend hormonal treatment.  She will call if she starts skipping menses.  Referral to Rheumatology for muscle fatigue.  TDap. Considering Covid booster. Follow up annually and prn.   After visit summary provided.

## 2020-11-13 NOTE — Telephone Encounter (Signed)
ADA form completed, signed, and sent to medical records for processing. This form allows pt to work from home up to 8 days/month if having a bad migraine and also if needed could be out of work 2 days per month.

## 2020-11-14 ENCOUNTER — Other Ambulatory Visit (HOSPITAL_COMMUNITY)
Admission: RE | Admit: 2020-11-14 | Discharge: 2020-11-14 | Disposition: A | Discharge status: Home / Self Care | Payer: 59 | Source: Ambulatory Visit | Attending: Obstetrics and Gynecology | Admitting: Obstetrics and Gynecology

## 2020-11-14 ENCOUNTER — Encounter: Payer: Self-pay | Admitting: Obstetrics and Gynecology

## 2020-11-14 ENCOUNTER — Other Ambulatory Visit: Payer: Self-pay

## 2020-11-14 ENCOUNTER — Telehealth: Payer: Self-pay | Admitting: Obstetrics and Gynecology

## 2020-11-14 ENCOUNTER — Ambulatory Visit (INDEPENDENT_AMBULATORY_CARE_PROVIDER_SITE_OTHER): Payer: 59 | Admitting: Obstetrics and Gynecology

## 2020-11-14 VITALS — BP 130/82 | HR 78 | Ht 63.5 in | Wt 189.0 lb

## 2020-11-14 DIAGNOSIS — R3915 Urgency of urination: Secondary | ICD-10-CM

## 2020-11-14 DIAGNOSIS — Z23 Encounter for immunization: Secondary | ICD-10-CM | POA: Diagnosis not present

## 2020-11-14 DIAGNOSIS — Z01419 Encounter for gynecological examination (general) (routine) without abnormal findings: Secondary | ICD-10-CM | POA: Insufficient documentation

## 2020-11-14 DIAGNOSIS — R350 Frequency of micturition: Secondary | ICD-10-CM

## 2020-11-14 DIAGNOSIS — M6289 Other specified disorders of muscle: Secondary | ICD-10-CM

## 2020-11-14 DIAGNOSIS — N393 Stress incontinence (female) (male): Secondary | ICD-10-CM

## 2020-11-14 NOTE — Telephone Encounter (Signed)
Referral placed at [facility] location, my chart message sent to patient informing her.

## 2020-11-14 NOTE — Patient Instructions (Signed)

## 2020-11-14 NOTE — Telephone Encounter (Signed)
Please make referral to Austin Miles at [facility] for pelvic floor therapy for stress incontinence and urinary frequency.

## 2020-11-19 LAB — CYTOLOGY - PAP
Comment: NEGATIVE
High risk HPV: NEGATIVE

## 2020-11-20 ENCOUNTER — Other Ambulatory Visit: Payer: Self-pay

## 2020-11-20 ENCOUNTER — Encounter: Payer: Self-pay | Admitting: Nurse Practitioner

## 2020-11-20 ENCOUNTER — Other Ambulatory Visit: Payer: Self-pay | Admitting: Obstetrics and Gynecology

## 2020-11-20 ENCOUNTER — Encounter: Payer: Self-pay | Admitting: Obstetrics and Gynecology

## 2020-11-20 DIAGNOSIS — R87619 Unspecified abnormal cytological findings in specimens from cervix uteri: Secondary | ICD-10-CM

## 2020-11-20 NOTE — Telephone Encounter (Signed)
Patient scheduled on 12/05/20

## 2020-11-25 ENCOUNTER — Other Ambulatory Visit: Payer: Self-pay

## 2020-11-25 ENCOUNTER — Other Ambulatory Visit (HOSPITAL_COMMUNITY): Payer: Self-pay

## 2020-11-25 ENCOUNTER — Encounter (HOSPITAL_COMMUNITY): Payer: Self-pay | Admitting: Pharmacist

## 2020-11-28 NOTE — Progress Notes (Signed)
  Subjective:     Patient ID: Emily Barker, female   DOB: May 10, 1975, 45 y.o.   MRN: [id number]  HPI Patient here today for colposcopy with pap smear 11-14-20 AGUS:Neg HR HPV.  PAP HISTORY 11-14-20 AGUS;Neg HR HPV 07-12-17 Neg:Neg HR HPV 03-13-15 Neg:Neg HR HPV 02-27-13 Neg:Neg HR HPV  2007 Hx colposcopy and LEEP procedure--CIN 2 with Pos. HR HPV    Review of Systems  All other systems reviewed and are negative. LMP: 11-09-20 Contraception: Vasectomy UPT: neg     Objective:   Physical Exam Genitourinary:     Colposcopy and endometrial biopsy Consent for procedures.  Acetic acid placed in vagina.  Satisfactory colposcopy.  White light and green filter used.  Increased vascularity at 6:00.  ECC and then biopsy at 6:00 taken and sent to pathology separately.  Cervix sterilized with Hibiclens.  Tenaculum use.  Pipelle passed x 2 to 7 and then 8 cm.  Tissue to pathology.  Monsel's placed on cervix.  Minimal EBL.  No complications.     Assessment:     AGUS pap. Neg HR HPV.  Hx LEEP, CIN II.    Plan:     FU biopsies.  Post colpo precautions given.  Final plan to follow.

## 2020-12-03 ENCOUNTER — Other Ambulatory Visit: Payer: Self-pay

## 2020-12-03 ENCOUNTER — Other Ambulatory Visit (HOSPITAL_COMMUNITY)
Admission: RE | Admit: 2020-12-03 | Discharge: 2020-12-03 | Disposition: A | Discharge status: Home / Self Care | Payer: 59 | Source: Ambulatory Visit | Attending: Obstetrics and Gynecology | Admitting: Obstetrics and Gynecology

## 2020-12-03 ENCOUNTER — Ambulatory Visit (INDEPENDENT_AMBULATORY_CARE_PROVIDER_SITE_OTHER): Payer: 59 | Admitting: Obstetrics and Gynecology

## 2020-12-03 VITALS — BP 112/70 | HR 114 | Ht 63.5 in | Wt 189.0 lb

## 2020-12-03 DIAGNOSIS — R87619 Unspecified abnormal cytological findings in specimens from cervix uteri: Secondary | ICD-10-CM | POA: Diagnosis not present

## 2020-12-03 DIAGNOSIS — Z01812 Encounter for preprocedural laboratory examination: Secondary | ICD-10-CM

## 2020-12-03 DIAGNOSIS — N879 Dysplasia of cervix uteri, unspecified: Secondary | ICD-10-CM | POA: Diagnosis not present

## 2020-12-03 LAB — PREGNANCY, URINE: Preg Test, Ur: NEGATIVE

## 2020-12-03 NOTE — Patient Instructions (Signed)
Colposcopy, Care After The following information offers guidance on how to care for yourself after your procedure. Your doctor may also give you more specific instructions. If you have problems or questions, contact your doctor. What can I expect after the procedure? If you did not have a sample of your tissue taken out (did not have a biopsy), you may only have some spotting of blood for a few days. You can go back to your normal activities. If you had a sample of your tissue taken out, it is common to have: Soreness and mild pain. These may last for a few days. Mild bleeding or fluid (discharge) coming from your vagina. The fluid will look dark and grainy. You may have this for a few days. The fluid may be caused by a liquid that was used during your procedure. You may need to wear a sanitary pad. Spotting of blood for at least 48 hours after the procedure. Follow these instructions at home: Medicines Take over-the-counter and prescription medicines only as told by your doctor. Ask your doctor what over-the-counter pain medicines and prescription medicines you can start taking again. This is very important if you take blood thinners. Activity For at least 3 days, or for as long as told by your doctor, avoid: Douching. Using tampons. Having sex. Return to your normal activities as told by your doctor. Ask your doctor what activities are safe for you. General instructions Ask your doctor if you may take baths, swim, or use a hot tub. You may take showers. If you use birth control (contraception), keep using it. Keep all follow-up visits. Contact a doctor if: You have a fever or chills. You faint or feel light-headed. Get help right away if: You bleed a lot from your vagina. A lot of bleeding means that the bleeding soaks through a pad in less than 1 hour. You have clumps of blood (blood clots) coming from your vagina. You have signs that could mean you have an infection. This may be fluid  coming from your vagina that is: Different than normal. Yellow. Bad-smelling. You have very bad pain or cramps in your lower belly that do not get better with medicine. Summary If you did not have a sample of your tissue taken out, you may only have some spotting of blood for a few days. You can go back to your normal activities. If you had a sample of your tissue taken out, it is common to have mild pain for a few days and spotting for 48 hours. Avoid douching, using tampons, and having sex for at least 3 days after the procedure or for as long as told. Get help right away if you have a lot of bleeding, very bad pain, or signs of infection. This information is not intended to replace advice given to you by your health care provider. Make sure you discuss any questions you have with your health care provider. Document Revised: 06/09/2020 Document Reviewed: 06/09/2020 Elsevier Patient Education  2022 [employer].  

## 2020-12-04 LAB — SURGICAL PATHOLOGY

## 2020-12-05 ENCOUNTER — Ambulatory Visit: Payer: 59 | Admitting: Physical Therapy

## 2020-12-10 ENCOUNTER — Encounter: Payer: Self-pay | Admitting: Obstetrics and Gynecology

## 2020-12-11 ENCOUNTER — Other Ambulatory Visit (HOSPITAL_COMMUNITY): Payer: Self-pay

## 2020-12-12 ENCOUNTER — Ambulatory Visit: Payer: 59 | Admitting: Family Medicine

## 2020-12-16 ENCOUNTER — Other Ambulatory Visit (HOSPITAL_COMMUNITY): Payer: Self-pay

## 2020-12-17 ENCOUNTER — Other Ambulatory Visit (HOSPITAL_COMMUNITY): Payer: Self-pay

## 2020-12-17 ENCOUNTER — Ambulatory Visit: Payer: 59 | Admitting: Neurology

## 2020-12-20 ENCOUNTER — Other Ambulatory Visit (HOSPITAL_COMMUNITY): Payer: Self-pay

## 2020-12-23 ENCOUNTER — Telehealth: Payer: Self-pay | Admitting: Neurology

## 2020-12-23 ENCOUNTER — Ambulatory Visit: Payer: 59 | Admitting: Family Medicine

## 2020-12-23 NOTE — Telephone Encounter (Signed)
Pt cancelled appt due to mother is sick can not come to appt. Transferred to Billing.

## 2020-12-23 NOTE — Progress Notes (Deleted)
12/23/20 ALL: She returns for Botox. She continues Nurtec for abortive therapy.   From Dr Cathren Laine last note 09/09/2020 02/20/2020 AA: She is still doing great. But it wore off, wasn;t as effective higher up in the forehead but be careful for forehead drooping we did it 3/4 up the forehead instead. +temporals. NO masseters. Will try nurtec.She loved nurtec has only had to use one. +5Orb Oculi. +Temporalis. Also +5U occipital emergence and +10 bilat U cervical paraspinals, ask her to show where to place the extras in the cervical muscles she has a few spots in her lower cervical spine - ask. She goes to [employer] [facility]   No orders of the defined types were placed in this encounter.   08/08/2019 AA: She is still doing great. But it wore off, wasn;t as effective higher up in the forehead but be careful for forehead drooping we did it 3/4 up the forehead instead. +temporals. NO masseters.   Consent Form Botulism Toxin Injection For Chronic Migraine    Reviewed orally with patient, additionally signature is on file:  Botulism toxin has been approved by the Federal drug administration for treatment of chronic migraine. Botulism toxin does not cure chronic migraine and it may not be effective in some patients.  The administration of botulism toxin is accomplished by injecting a small amount of toxin into the muscles of the neck and head. Dosage must be titrated for each individual. Any benefits resulting from botulism toxin tend to wear off after 3 months with a repeat injection required if benefit is to be maintained. Injections are usually done every 3-4 months with maximum effect peak achieved by about 2 or 3 weeks. Botulism toxin is expensive and you should be sure of what costs you will incur resulting from the injection.  The side effects of botulism toxin use for chronic migraine may include:   -Transient, and usually mild, facial weakness with facial injections  -Transient, and usually  mild, head or neck weakness with head/neck injections  -Reduction or loss of forehead facial animation due to forehead muscle weakness  -Eyelid drooping  -Dry eye  -Pain at the site of injection or bruising at the site of injection  -Double vision  -Potential unknown long term risks   Contraindications: You should not have Botox if you are pregnant, nursing, allergic to albumin, have an infection, skin condition, or muscle weakness at the site of the injection, or have myasthenia gravis, Lambert-Eaton syndrome, or ALS.  It is also possible that as with any injection, there may be an allergic reaction or no effect from the medication. Reduced effectiveness after repeated injections is sometimes seen and rarely infection at the injection site may occur. All care will be taken to prevent these side effects. If therapy is given over a long time, atrophy and wasting in the muscle injected may occur. Occasionally the patient's become refractory to treatment because they develop antibodies to the toxin. In this event, therapy needs to be modified.  I have read the above information and consent to the administration of botulism toxin.    BOTOX PROCEDURE NOTE FOR MIGRAINE HEADACHE  Contraindications and precautions discussed with patient(above). Aseptic procedure was observed and patient tolerated procedure. Procedure performed by Debbora Presto, FNP-C.   The condition has existed for more than 6 months, and pt does not have a diagnosis of ALS, Myasthenia Gravis or Lambert-Eaton Syndrome.  Risks and benefits of injections discussed and pt agrees to proceed with the procedure.  Written consent obtained  These injections are medically necessary. Pt  receives good benefits from these injections. These injections do not cause sedations or hallucinations which the oral therapies may cause.   Description of procedure:  The patient was placed in a sitting position. The standard protocol was used for Botox as  follows, with 5 units of Botox injected at each site:  -Procerus muscle, midline injection  -Corrugator muscle, bilateral injection  -Frontalis muscle, bilateral injection, with 2 sites each side, medial injection was performed in the upper one third of the frontalis muscle, in the region vertical from the medial inferior edge of the superior orbital rim. The lateral injection was again in the upper one third of the forehead vertically above the lateral limbus of the cornea, 1.5 cm lateral to the medial injection site.  -Temporalis muscle injection, 4 sites, bilaterally. The first injection was 3 cm above the tragus of the ear, second injection site was 1.5 cm to 3 cm up from the first injection site in line with the tragus of the ear. The third injection site was 1.5-3 cm forward between the first 2 injection sites. The fourth injection site was 1.5 cm posterior to the second injection site. 5th site laterally in the temporalis  muscleat the level of the outer canthus.  -Occipitalis muscle injection, 3 sites, bilaterally. The first injection was done one half way between the occipital protuberance and the tip of the mastoid process behind the ear. The second injection site was done lateral and superior to the first, 1 fingerbreadth from the first injection. The third injection site was 1 fingerbreadth superiorly and medially from the first injection site.  -Cervical paraspinal muscle injection, 2 sites, bilaterally. The first injection site was 1 cm from the midline of the cervical spine, 3 cm inferior to the lower border of the occipital protuberance. The second injection site was 1.5 cm superiorly and laterally to the first injection site.  -Trapezius muscle injection was performed at 3 sites, bilaterally. The first injection site was in the upper trapezius muscle halfway between the inflection point of the neck, and the acromion. The second injection site was one half way between the acromion and  the first injection site. The third injection was done between the first injection site and the inflection point of the neck.   Will return for repeat injection in 3 months.   A total of 200 units of Botox was prepared, 155 units of Botox was injected as documented above, any Botox not injected was wasted. The patient tolerated the procedure well, there were no complications of the above procedure.

## 2020-12-25 ENCOUNTER — Ambulatory Visit: Payer: 59 | Admitting: Family Medicine

## 2020-12-25 DIAGNOSIS — G43709 Chronic migraine without aura, not intractable, without status migrainosus: Secondary | ICD-10-CM

## 2020-12-25 NOTE — Progress Notes (Signed)
12/25/20 ALL: She returns for Botox. She continues Nurtec for abortive therapy. She is doing very well on Botox therapy.   From Dr Cathren Laine last note 09/09/2020 02/20/2020 AA: She is still doing great. But it wore off, wasn;t as effective higher up in the forehead but be careful for forehead drooping we did it 3/4 up the forehead instead. +temporals. NO masseters. Will try nurtec.She loved nurtec has only had to use one. +5Orb Oculi. +Temporalis. Also +5U occipital emergence and +10 bilat U cervical paraspinals, ask her to show where to place the extras in the cervical muscles she has a few spots in her lower cervical spine - ask. She goes to [employer]   No orders of the defined types were placed in this encounter.   08/08/2019 AA: She is still doing great. But it wore off, wasn;t as effective higher up in the forehead but be careful for forehead drooping we did it 3/4 up the forehead instead. +temporals. NO masseters.   Consent Form Botulism Toxin Injection For Chronic Migraine   Reviewed orally with patient, additionally signature is on file:  Botulism toxin has been approved by the Federal drug administration for treatment of chronic migraine. Botulism toxin does not cure chronic migraine and it may not be effective in some patients.  The administration of botulism toxin is accomplished by injecting a small amount of toxin into the muscles of the neck and head. Dosage must be titrated for each individual. Any benefits resulting from botulism toxin tend to wear off after 3 months with a repeat injection required if benefit is to be maintained. Injections are usually done every 3-4 months with maximum effect peak achieved by about 2 or 3 weeks. Botulism toxin is expensive and you should be sure of what costs you will incur resulting from the injection.  The side effects of botulism toxin use for chronic migraine may include:   -Transient, and usually mild, facial weakness with facial  injections  -Transient, and usually mild, head or neck weakness with head/neck injections  -Reduction or loss of forehead facial animation due to forehead muscle weakness  -Eyelid drooping  -Dry eye  -Pain at the site of injection or bruising at the site of injection  -Double vision  -Potential unknown long term risks   Contraindications: You should not have Botox if you are pregnant, nursing, allergic to albumin, have an infection, skin condition, or muscle weakness at the site of the injection, or have myasthenia gravis, Lambert-Eaton syndrome, or ALS.  It is also possible that as with any injection, there may be an allergic reaction or no effect from the medication. Reduced effectiveness after repeated injections is sometimes seen and rarely infection at the injection site may occur. All care will be taken to prevent these side effects. If therapy is given over a long time, atrophy and wasting in the muscle injected may occur. Occasionally the patient's become refractory to treatment because they develop antibodies to the toxin. In this event, therapy needs to be modified.  I have read the above information and consent to the administration of botulism toxin.    BOTOX PROCEDURE NOTE FOR MIGRAINE HEADACHE  Contraindications and precautions discussed with patient(above). Aseptic procedure was observed and patient tolerated procedure. Procedure performed by Debbora Presto, FNP-C.   The condition has existed for more than 6 months, and pt does not have a diagnosis of ALS, Myasthenia Gravis or Lambert-Eaton Syndrome.  Risks and benefits of injections discussed and pt  agrees to proceed with the procedure.  Written consent obtained  These injections are medically necessary. Pt  receives good benefits from these injections. These injections do not cause sedations or hallucinations which the oral therapies may cause.   Description of procedure:  The patient was placed in a sitting position. The  standard protocol was used for Botox as follows, with 5 units of Botox injected at each site:  -Procerus muscle, midline injection  -Corrugator muscle, bilateral injection  -Frontalis muscle, bilateral injection, with 2 sites each side, medial injection was performed in the upper one third of the frontalis muscle, in the region vertical from the medial inferior edge of the superior orbital rim. The lateral injection was again in the upper one third of the forehead vertically above the lateral limbus of the cornea, 1.5 cm lateral to the medial injection site.  -Temporalis muscle injection, 4 sites, bilaterally. The first injection was 3 cm above the tragus of the ear, second injection site was 1.5 cm to 3 cm up from the first injection site in line with the tragus of the ear. The third injection site was 1.5-3 cm forward between the first 2 injection sites. The fourth injection site was 1.5 cm posterior to the second injection site. 5th site laterally in the temporalis  muscleat the level of the outer canthus.  -Occipitalis muscle injection, 3 sites, bilaterally. The first injection was done one half way between the occipital protuberance and the tip of the mastoid process behind the ear. The second injection site was done lateral and superior to the first, 1 fingerbreadth from the first injection. The third injection site was 1 fingerbreadth superiorly and medially from the first injection site.  -Cervical paraspinal muscle injection, 2 sites, bilaterally. The first injection site was 1 cm from the midline of the cervical spine, 3 cm inferior to the lower border of the occipital protuberance. The second injection site was 1.5 cm superiorly and laterally to the first injection site.  -Trapezius muscle injection was performed at 3 sites, bilaterally. The first injection site was in the upper trapezius muscle halfway between the inflection point of the neck, and the acromion. The second injection site was  one half way between the acromion and the first injection site. The third injection was done between the first injection site and the inflection point of the neck.   Will return for repeat injection in 3 months.   A total of 200 units of Botox was prepared, 155 units of Botox was injected as documented above, 45 units of Botox wasted. The patient tolerated the procedure well, there were no complications of the above procedure.

## 2020-12-25 NOTE — Progress Notes (Signed)
Botox- 200 units x 1 vial Lot: [id number] Expiration: 05/25 NDC: 9983-3825-05  0.9% Sodium Chloride- 26mL total Lot: [id number] Expiration: 01/26/22 NDC: 4193-7902-40  Dx: X73.532 B/B

## 2020-12-26 ENCOUNTER — Encounter: Payer: Self-pay | Admitting: Neurology

## 2020-12-27 NOTE — Progress Notes (Deleted)
Office Visit Note  Patient: Emily Barker             Date of Birth: [birth year]           MRN: [id number]             PCP: Flossie Buffy, NP Referring: Aundria Rud* Visit Date: 01/08/2021 Occupation: @GUAROCC @  Subjective:  No chief complaint on file.   History of Present Illness: Emily Barker is a 45 y.o. female ***   Activities of Daily Living:  Patient reports morning stiffness for *** {minute/hour:19697}.   Patient {ACTIONS;DENIES/REPORTS:21021675::"Denies"} nocturnal pain.  Difficulty dressing/grooming: {ACTIONS;DENIES/REPORTS:21021675::"Denies"} Difficulty climbing stairs: {ACTIONS;DENIES/REPORTS:21021675::"Denies"} Difficulty getting out of chair: {ACTIONS;DENIES/REPORTS:21021675::"Denies"} Difficulty using hands for taps, buttons, cutlery, and/or writing: {ACTIONS;DENIES/REPORTS:21021675::"Denies"}  No Rheumatology ROS completed.   PMFS History:  Patient Active Problem List   Diagnosis Date Noted   Right foot pain 09/26/2020   Healthcare maintenance 09/26/2020   Sprain of toe 09/26/2020   Pure hypercholesterolemia 03/20/2020   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   Essential (primary) hypertension 09/07/2018   Cavernous hemangioma of brain ([place]) 05/06/2015   Migraine headache 05/06/2015   Hot flashes 03/13/2014   Migraines 11/12/2013   Diplopia 03/02/2012   Hypertropia 03/02/2012   Hypothyroid 10/09/2011   Thyroid nodule 10/02/2011   Diverticulitis 02/01/2009    Past Medical History:  Diagnosis Date   Abnormal Pap smear of cervix 2007   CIN 2 + HRHPV/ LEEP   Allergy    Anemia    Anxiety 8413   Complication of anesthesia    bp dropped with epidural   Contraception    husband vasectomy   Diverticulosis    Dysmenorrhea    GERD (gastroesophageal reflux disease)    Hashimoto's thyroiditis    Heart murmur    as a child   Hemangioma, intracranial structures ([place]) 2013   Left Frontal Lobe   Hepatitis A     Hypertension    Hypothyroid    right thyroid nodule   Migraines    Panic disorder 2012   Sinus tachycardia 2012   Vertigo     Family History  Problem Relation Age of Onset   Prostate cancer Father 79       Metastatic CA to bones   Hypertension Father    Alcohol abuse Father    Heart disease Father    Cancer Father        Prostate   Hyperlipidemia Mother    Alcohol abuse Paternal Grandfather    Heart disease Paternal Grandfather    Dementia Maternal Grandmother    Heart disease Maternal Grandfather    Heart disease Paternal Grandmother    Hypertension Brother    Breast cancer Maternal Aunt    Depression Maternal Aunt    Diabetes Maternal Aunt    Heart attack Maternal Uncle    Heart disease Paternal Aunt    Depression Maternal Aunt    Diabetes Maternal Aunt    Heart attack Paternal Uncle    Migraines Neg Hx    Colon cancer Neg Hx    Esophageal cancer Neg Hx    Rectal cancer Neg Hx    Stomach cancer Neg Hx    Past Surgical History:  Procedure Laterality Date   CERVICAL BIOPSY  W/ LOOP ELECTRODE EXCISION  2007   CIN 2   COLPOSCOPY  2007   CIN 2/ HRHPV   DILATION AND CURETTAGE OF UTERUS     SEPTOPLASTY     TONSILLECTOMY AND  ADENOIDECTOMY  1   Social History   Social History Narrative   Lives with husband, children   Caffeine use:  1 cup coffee/day   Originally from [place], grew up in [place]   Immunization History  Administered Date(s) Administered   Influenza,inj,Quad PF,6+ Mos 11/08/2013, 10/09/2015, 10/29/2016, 10/29/2017, 09/26/2020   Influenza-Unspecified 11/08/2013, 10/09/2015, 10/21/2018   PFIZER Comirnaty(Gray Top)Covid-19 Tri-Sucrose Vaccine 03/10/2019, 07/21/2019, 12/07/2019   Tdap 11/14/2020     Objective: Vital Signs: There were no vitals taken for this visit.   Physical Exam   Musculoskeletal Exam: ***  CDAI Exam: CDAI Score: -- Patient Global: --; Provider Global: -- Swollen: --; Tender: -- Joint Exam 01/08/2021   No joint exam has  been documented for this visit   There is currently no information documented on the homunculus. Go to the Rheumatology activity and complete the homunculus joint exam.  Investigation: No additional findings.  Imaging: No results found.  Recent Labs: Lab Results  Component Value Date   WBC 5.2 09/06/2018   HGB 12.2 09/06/2018   PLT 241.0 09/06/2018   NA 141 03/20/2020   K 4.8 03/20/2020   CL 105 03/20/2020   CO2 31 03/20/2020   GLUCOSE 96 03/20/2020   BUN 11 03/20/2020   CREATININE 0.78 03/20/2020   BILITOT 0.5 03/20/2020   ALKPHOS 58 03/20/2020   AST 14 03/20/2020   ALT 9 03/20/2020   PROT 7.1 03/20/2020   ALBUMIN 4.3 03/20/2020   CALCIUM 9.8 03/20/2020   GFRAA >89 01/30/2015    Speciality Comments: No specialty comments available.  Procedures:  No procedures performed Allergies: Other, Penicillin g, Penicillins, Iodinated diagnostic agents, Miconazole nitrate, Monistat [miconazole], Gluten meal, Latex, and Sulfa antibiotics   Assessment / Plan:     Visit Diagnoses: Muscle fatigue  Migraine without aura and without status migrainosus, not intractable  Essential (primary) hypertension  Congenital anomaly of cerebrovascular system  Cavernous hemangioma of brain ([facility])  Thyroid nodule  Hypothyroidism due to Hashimoto's thyroiditis  Pure hypercholesterolemia  Diplopia  Diverticulitis  Orders: No orders of the defined types were placed in this encounter.  No orders of the defined types were placed in this encounter.   Face-to-face time spent with patient was *** minutes. Greater than 50% of time was spent in counseling and coordination of care.  Follow-Up Instructions: No follow-ups on file.   Ofilia Neas, PA-C  Note - This record has been created using Dragon software.  Chart creation errors have been sought, but may not always  have been located. Such creation errors do not reflect on  the standard of medical care.

## 2021-01-08 ENCOUNTER — Ambulatory Visit: Payer: 59 | Admitting: Rheumatology

## 2021-01-08 DIAGNOSIS — I10 Essential (primary) hypertension: Secondary | ICD-10-CM

## 2021-01-08 DIAGNOSIS — E7800 Pure hypercholesterolemia, unspecified: Secondary | ICD-10-CM

## 2021-01-08 DIAGNOSIS — G43009 Migraine without aura, not intractable, without status migrainosus: Secondary | ICD-10-CM

## 2021-01-08 DIAGNOSIS — E038 Other specified hypothyroidism: Secondary | ICD-10-CM

## 2021-01-08 DIAGNOSIS — M6289 Other specified disorders of muscle: Secondary | ICD-10-CM

## 2021-01-08 DIAGNOSIS — H532 Diplopia: Secondary | ICD-10-CM

## 2021-01-08 DIAGNOSIS — E041 Nontoxic single thyroid nodule: Secondary | ICD-10-CM

## 2021-01-08 DIAGNOSIS — D1802 Hemangioma of intracranial structures: Secondary | ICD-10-CM

## 2021-01-08 DIAGNOSIS — Q283 Other malformations of cerebral vessels: Secondary | ICD-10-CM

## 2021-01-08 DIAGNOSIS — K5792 Diverticulitis of intestine, part unspecified, without perforation or abscess without bleeding: Secondary | ICD-10-CM

## 2021-01-14 ENCOUNTER — Other Ambulatory Visit (HOSPITAL_COMMUNITY): Payer: Self-pay

## 2021-01-14 MED ORDER — ARMOUR THYROID 60 MG PO TABS
ORAL_TABLET | ORAL | 2 refills | Status: DC
  Filled 2021-01-14: qty 30, 30d supply, fill #0
  Filled 2021-02-08: qty 30, 30d supply, fill #1
  Filled 2021-03-09: qty 30, 30d supply, fill #2

## 2021-01-19 ENCOUNTER — Encounter: Payer: Self-pay | Admitting: Obstetrics and Gynecology

## 2021-01-28 ENCOUNTER — Other Ambulatory Visit: Payer: Self-pay

## 2021-01-28 ENCOUNTER — Encounter: Payer: Self-pay | Admitting: Physical Therapy

## 2021-01-28 ENCOUNTER — Ambulatory Visit: Payer: 59 | Attending: Obstetrics and Gynecology | Admitting: Physical Therapy

## 2021-01-28 DIAGNOSIS — R350 Frequency of micturition: Secondary | ICD-10-CM | POA: Diagnosis not present

## 2021-01-28 DIAGNOSIS — M6281 Muscle weakness (generalized): Secondary | ICD-10-CM | POA: Insufficient documentation

## 2021-01-28 DIAGNOSIS — R293 Abnormal posture: Secondary | ICD-10-CM | POA: Diagnosis not present

## 2021-01-28 DIAGNOSIS — N393 Stress incontinence (female) (male): Secondary | ICD-10-CM | POA: Insufficient documentation

## 2021-01-28 NOTE — Therapy (Signed)
[facility] [address] [place], [place], [address] Phone: [phone]   Fax:  [phone]  Physical Therapy Evaluation  Patient Details  Name: Emily Barker MRN: [id number] Date of Birth: 20-Jan-1976 Referring Provider (PT): Patton Salles, MD   Encounter Date: 01/28/2021   PT End of Session - 01/28/21 1634     Visit Number 1    Date for PT Re-Evaluation 04/22/21    Authorization Type [employer] employee    PT Start Time 1610    PT Stop Time 1648    PT Time Calculation (min) 38 min    Activity Tolerance Patient tolerated treatment well    Behavior During Therapy [facility] for tasks assessed/performed             Past Medical History:  Diagnosis Date   Abnormal Pap smear of cervix 2007   CIN 2 + HRHPV/ LEEP   Allergy    Anemia    Anxiety 2012   Complication of anesthesia    bp dropped with epidural   Contraception    husband vasectomy   Diverticulosis    Dysmenorrhea    GERD (gastroesophageal reflux disease)    Hashimoto's thyroiditis    Heart murmur    as a child   Hemangioma, intracranial structures (HCC) 2013   Left Frontal Lobe   Hepatitis A    Hypertension    Hypothyroid    right thyroid nodule   Migraines    Panic disorder 2012   Sinus tachycardia 2012   Vertigo     Past Surgical History:  Procedure Laterality Date   CERVICAL BIOPSY  W/ LOOP ELECTRODE EXCISION  2007   CIN 2   COLPOSCOPY  2007   CIN 2/ HRHPV   DILATION AND CURETTAGE OF UTERUS     SEPTOPLASTY     TONSILLECTOMY AND ADENOIDECTOMY  1988    There were no vitals filed for this visit.    Subjective Assessment - 01/28/21 1609     Subjective Pt states she always has to pee.  She works in [employer] center. Pt is leaking with coughing and sneezing the most and then with exercises a little.  Drinking water throughout the day. This has gone on 10 years but recently worse. Leakage is happening    Patient Stated Goals be  able to stop feeling like I have to pee all the time and stop leaking    Currently in Pain? No/denies                [facility] PT Assessment - 01/29/21 0001       Assessment   Medical Diagnosis N39.3 (ICD-10-CM) - Stress incontinence;R35.0 (ICD-10-CM) - Urinary frequency    Referring Provider (PT) Patton Salles, MD    Onset Date/Surgical Date --   years - insidious but worsening   Prior Therapy No      Precautions   Precautions None      Balance Screen   Has the patient fallen in the past 6 months No      Home Environment   Living Environment Private residence    Living Arrangements Spouse/significant other;Children      Prior Function   Level of Independence Independent    Vocation Full time employment    Vocation Requirements social worker      Cognition   Overall Cognitive Status Within Functional Limits for tasks assessed      Functional Tests  Functional tests Squat;Single leg stance      Squat   Comments normal      Single Leg Stance   Comments normal      Posture/Postural Control   Posture/Postural Control Postural limitations    Postural Limitations Anterior pelvic tilt;Increased lumbar lordosis      ROM / Strength   AROM / PROM / Strength AROM;Strength;PROM      AROM   Overall AROM Comments lumbar 80%      PROM   Overall PROM Comments WFL      Strength   Overall Strength Comments 5/5 hip bil      Flexibility   Soft Tissue Assessment /Muscle Length yes    Hamstrings WFL      Palpation   Palpation comment back and gluteals tight      Ambulation/Gait   Gait Pattern Within Functional Limits                        Objective measurements completed on examination: See above findings.     Pelvic Floor Special Questions - 01/29/21 0001     Prior Pelvic/Prostate Exam Yes    Are you Pregnant or attempting pregnancy? Yes    Currently Sexually Active Yes    Urinary Leakage Yes    How often daily intermittently     Pad use No    Activities that cause leaking Exercising;Coughing;Sneezing    Urinary urgency Yes    Urinary frequency yes    Fecal incontinence No    Falling out feeling (prolapse) Yes    Activities that cause feeling of prolapse all the time feels the same    Prolapse Anterior Wall    Pelvic Floor Internal Exam pt identity confimred and internal consent given    Exam Type Vaginal    Sensation normal    Palpation normal    Strength fair squeeze, definite lift    Strength # of reps 4    Strength # of seconds 1    Tone low                    No emotional/communication barriers or cognitive limitation. Patient is motivated to learn. Patient understands and agrees with treatment goals and plan. PT explains patient will be examined in standing, sitting, and lying down to see how their muscles and joints work. When they are ready, they will be asked to remove their underwear so PT can examine their perineum. The patient is also given the option of providing their own chaperone as one is not provided in our facility. The patient also has the right and is explained the right to defer or refuse any part of the evaluation or treatment including the internal exam. With the patient's consent, PT will use one gloved finger to gently assess the muscles of the pelvic floor, seeing how well it contracts and relaxes and if there is muscle symmetry. After, the patient will get dressed and PT and patient will discuss exam findings and plan of care. PT and patient discuss plan of care, schedule, attendance policy and HEP activities.     PT Education - 01/29/21 1048     Education Details Access Code: [email or web address]; educated on knack    Person(s) Educated Patient    Methods Explanation;Demonstration;Tactile cues;Verbal cues;Handout    Comprehension Verbalized understanding;Returned demonstration              PT Short Term Goals - 01/28/21 1655  PT SHORT TERM GOAL #1   Title Pt will be able  to sustain pelvic floor contraction for 10 seconds    Time 6    Period Weeks    Status New    Target Date 03/11/21      PT SHORT TERM GOAL #2   Title Pt will report 50% less leakage with coughing and sneezing    Time 6    Period Weeks    Status New    Target Date 03/11/21               PT Long Term Goals - 01/28/21 1615       PT LONG TERM GOAL #1   Title Pt will stop feeling like she has to pee and be able to not think about it for at least 2-3 hours    Time 12    Period Weeks    Status New    Target Date 04/22/21      PT LONG TERM GOAL #2   Title Pt will be able to do weight lifting exercises without leaking    Time 12    Period Weeks    Status New    Target Date 04/22/21      PT LONG TERM GOAL #3   Title Pt will be able do 10 sumo squats without leaking or pee.    Time 12    Period Weeks    Status New    Target Date 04/22/21      PT LONG TERM GOAL #4   Title Pt will report feeling the bladder urges at least 50% less frequently.    Time 12    Period Weeks    Status New    Target Date 04/22/21                    Plan - 01/29/21 0751     Clinical Impression Statement Pt presents to skilled PT due to increased frequency and stress urinary incontinence. Pt has anterior wall prolapse that is present at the hymen and comes past the vaginal entrance with valsalva.  Pt has 3/5 MMT can hold for 1 seconds and can do 4 quick flicks which demonstrates limited endurance. Pt has 5/5 MMT bil hips and looks normal in single leg and squat.  Pt has posture abnormalities as mentioned above with increased anterior pelvic tilt demonstrating some core weakness Pt does well with back supported but core weakness more apparent with unsupported activities as she mentions push ups are very difficult for her. Pt is very active and would like to maintain and improve strength and healthy lifestyle.  She will benefit from skilled PT to reduce or eliminate leakage as well as reduce  risk of increased pelvic organ prolapse.    Personal Factors and Comorbidities Time since onset of injury/illness/exacerbation;Comorbidity 1;Comorbidity 2    Comorbidities 3 vaginal deliveries, perimenopaus    Examination-Activity Limitations Toileting;Continence    Examination-Participation Restrictions Community Activity    Stability/Clinical Decision Making Stable/Uncomplicated    Clinical Decision Making Low    Rehab Potential Excellent    PT Frequency 1x / week    PT Duration 12 weeks    PT Treatment/Interventions ADLs/Self Care Home Management;Biofeedback;Cryotherapy;Electrical Stimulation;Moist Heat;Therapeutic activities;Therapeutic exercise;Neuromuscular re-education;Patient/family education;Manual techniques;Dry needling;Taping    PT Next Visit Plan progress strength pelvic and core    PT Home Exercise Plan Access Code: [email or web address]    Consulted and Agree with Plan of Care Patient  Patient will benefit from skilled therapeutic intervention in order to improve the following deficits and impairments:  Postural dysfunction, Decreased coordination, Decreased strength, Decreased endurance  Visit Diagnosis: Muscle weakness (generalized)  Abnormal posture     Problem List Patient Active Problem List   Diagnosis Date Noted   Right foot pain 09/26/2020   Healthcare maintenance 09/26/2020   Sprain of toe 09/26/2020   Pure hypercholesterolemia 03/20/2020   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   Essential (primary) hypertension 09/07/2018   Cavernous hemangioma of brain (HCC) 05/06/2015   Migraine headache 05/06/2015   Hot flashes 03/13/2014   Migraines 11/12/2013   Diplopia 03/02/2012   Hypertropia 03/02/2012   Hypothyroid 10/09/2011   Thyroid nodule 10/02/2011   Diverticulitis 02/01/2009    Brayton Caves Avrey Hyser, PT 01/29/2021, 11:06 AM  [facility] @ [facility] [address] [place], [place],  [address] Phone: [phone]   Fax:  [phone]  Name: Emily Barker MRN: [id number] Date of Birth: January 07, 1976

## 2021-01-29 NOTE — Patient Instructions (Signed)
Access Code: [email or web address] URL: [email or web address] Date: 01/29/2021 Prepared by: Jari Favre  Exercises Supine Hip Adductor Squeeze with Small Ball - 3 x daily - 7 x weekly - 1 sets - 10 reps - 3 sec hold Standing Low Back Flexion at Table - 3 x daily - 7 x weekly - 1 sets - 8 reps

## 2021-01-31 NOTE — Progress Notes (Deleted)
Office Visit Note  Patient: Emily Barker             Date of Birth: April 15, 1975           MRN: [id number]             PCP: Flossie Buffy, NP Referring: Flossie Buffy, NP Visit Date: 02/12/2021 Occupation: @GUARO[employer] @  Subjective:  No chief complaint on file.   History of Present Illness: Emily Barker is a 46 y.o. female ***   Activities of Daily Living:  Patient reports morning stiffness for *** {minute/hour:19697}.   Patient {ACTIONS;DENIES/REPORTS:21021675::"Denies"} nocturnal pain.  Difficulty dressing/grooming: {ACTIONS;DENIES/REPORTS:21021675::"Denies"} Difficulty climbing stairs: {ACTIONS;DENIES/REPORTS:21021675::"Denies"} Difficulty getting out of chair: {ACTIONS;DENIES/REPORTS:21021675::"Denies"} Difficulty using hands for taps, buttons, cutlery, and/or writing: {ACTIONS;DENIES/REPORTS:21021675::"Denies"}  No Rheumatology ROS completed.   PMFS History:  Patient Active Problem List   Diagnosis Date Noted   Right foot pain 09/26/2020   Healthcare maintenance 09/26/2020   Sprain of toe 09/26/2020   Pure hypercholesterolemia 03/20/2020   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   Essential (primary) hypertension 09/07/2018   Cavernous hemangioma of brain ([facility]) 05/06/2015   Migraine headache 05/06/2015   Hot flashes 03/13/2014   Migraines 11/12/2013   Diplopia 03/02/2012   Hypertropia 03/02/2012   Hypothyroid 10/09/2011   Thyroid nodule 10/02/2011   Diverticulitis 02/01/2009    Past Medical History:  Diagnosis Date   Abnormal Pap smear of cervix 2007   CIN 2 + HRHPV/ LEEP   Allergy    Anemia    Anxiety 7510   Complication of anesthesia    bp dropped with epidural   Contraception    husband vasectomy   Diverticulosis    Dysmenorrhea    GERD (gastroesophageal reflux disease)    Hashimoto's thyroiditis    Heart murmur    as a child   Hemangioma, intracranial structures ([place]) 2013   Left Frontal Lobe   Hepatitis A     Hypertension    Hypothyroid    right thyroid nodule   Migraines    Panic disorder 2012   Sinus tachycardia 2012   Vertigo     Family History  Problem Relation Age of Onset   Prostate cancer Father 38       Metastatic CA to bones   Hypertension Father    Alcohol abuse Father    Heart disease Father    Cancer Father        Prostate   Hyperlipidemia Mother    Alcohol abuse Paternal Grandfather    Heart disease Paternal Grandfather    Dementia Maternal Grandmother    Heart disease Maternal Grandfather    Heart disease Paternal Grandmother    Hypertension Brother    Breast cancer Maternal Aunt    Depression Maternal Aunt    Diabetes Maternal Aunt    Heart attack Maternal Uncle    Heart disease Paternal Aunt    Depression Maternal Aunt    Diabetes Maternal Aunt    Heart attack Paternal Uncle    Migraines Neg Hx    Colon cancer Neg Hx    Esophageal cancer Neg Hx    Rectal cancer Neg Hx    Stomach cancer Neg Hx    Past Surgical History:  Procedure Laterality Date   CERVICAL BIOPSY  W/ LOOP ELECTRODE EXCISION  2007   CIN 2   COLPOSCOPY  2007   CIN 2/ HRHPV   DILATION AND CURETTAGE OF UTERUS     SEPTOPLASTY     TONSILLECTOMY AND  ADENOIDECTOMY  34   Social History   Social History Narrative   Lives with husband, children   Caffeine use:  1 cup coffee/day   Originally from [place], grew up in [place]   Immunization History  Administered Date(s) Administered   Influenza,inj,Quad PF,6+ Mos 11/08/2013, 10/09/2015, 10/29/2016, 10/29/2017, 09/26/2020   Influenza-Unspecified 11/08/2013, 10/09/2015, 10/21/2018   PFIZER Comirnaty(Gray Top)Covid-19 Tri-Sucrose Vaccine 03/10/2019, 07/21/2019, 12/07/2019   Tdap 11/14/2020     Objective: Vital Signs: There were no vitals taken for this visit.   Physical Exam   Musculoskeletal Exam: ***  CDAI Exam: CDAI Score: -- Patient Global: --; Provider Global: -- Swollen: --; Tender: -- Joint Exam 02/12/2021   No joint exam has  been documented for this visit   There is currently no information documented on the homunculus. Go to the Rheumatology activity and complete the homunculus joint exam.  Investigation: No additional findings.  Imaging: No results found.  Recent Labs: Lab Results  Component Value Date   WBC 5.2 09/06/2018   HGB 12.2 09/06/2018   PLT 241.0 09/06/2018   NA 141 03/20/2020   K 4.8 03/20/2020   CL 105 03/20/2020   CO2 31 03/20/2020   GLUCOSE 96 03/20/2020   BUN 11 03/20/2020   CREATININE 0.78 03/20/2020   BILITOT 0.5 03/20/2020   ALKPHOS 58 03/20/2020   AST 14 03/20/2020   ALT 9 03/20/2020   PROT 7.1 03/20/2020   ALBUMIN 4.3 03/20/2020   CALCIUM 9.8 03/20/2020   GFRAA >89 01/30/2015      Speciality Comments: No specialty comments available.  Procedures:  No procedures performed Allergies: Other, Penicillin g, Penicillins, Iodinated contrast media, Miconazole nitrate, Monistat [miconazole], Gluten meal, Latex, and Sulfa antibiotics   Assessment / Plan:     Visit Diagnoses: No diagnosis found.  Orders: No orders of the defined types were placed in this encounter.  No orders of the defined types were placed in this encounter.   Face-to-face time spent with patient was *** minutes. Greater than 50% of time was spent in counseling and coordination of care.  Follow-Up Instructions: No follow-ups on file.   Bo Merino, MD  Note - This record has been created using Editor, commissioning.  Chart creation errors have been sought, but may not always  have been located. Such creation errors do not reflect on  the standard of medical care.

## 2021-02-04 ENCOUNTER — Other Ambulatory Visit: Payer: Self-pay

## 2021-02-04 ENCOUNTER — Encounter: Payer: Self-pay | Admitting: Physical Therapy

## 2021-02-04 ENCOUNTER — Ambulatory Visit: Payer: 59 | Admitting: Physical Therapy

## 2021-02-04 ENCOUNTER — Other Ambulatory Visit: Payer: Self-pay | Admitting: *Deleted

## 2021-02-04 DIAGNOSIS — R350 Frequency of micturition: Secondary | ICD-10-CM | POA: Diagnosis not present

## 2021-02-04 DIAGNOSIS — G43709 Chronic migraine without aura, not intractable, without status migrainosus: Secondary | ICD-10-CM

## 2021-02-04 DIAGNOSIS — R293 Abnormal posture: Secondary | ICD-10-CM

## 2021-02-04 DIAGNOSIS — M6281 Muscle weakness (generalized): Secondary | ICD-10-CM

## 2021-02-04 DIAGNOSIS — E669 Obesity, unspecified: Secondary | ICD-10-CM

## 2021-02-04 DIAGNOSIS — N393 Stress incontinence (female) (male): Secondary | ICD-10-CM | POA: Diagnosis not present

## 2021-02-05 ENCOUNTER — Encounter: Payer: Self-pay | Admitting: Obstetrics and Gynecology

## 2021-02-05 ENCOUNTER — Ambulatory Visit: Payer: 59 | Admitting: Obstetrics and Gynecology

## 2021-02-05 ENCOUNTER — Other Ambulatory Visit (HOSPITAL_COMMUNITY): Payer: Self-pay

## 2021-02-05 VITALS — BP 122/86 | HR 102 | Ht 63.5 in | Wt 189.0 lb

## 2021-02-05 DIAGNOSIS — N951 Menopausal and female climacteric states: Secondary | ICD-10-CM

## 2021-02-05 MED ORDER — ESTRADIOL 0.05 MG/24HR TD PTTW
1.0000 | MEDICATED_PATCH | TRANSDERMAL | 0 refills | Status: DC
  Filled 2021-02-05: qty 24, 84d supply, fill #0
  Filled 2021-02-05: qty 8, 28d supply, fill #0
  Filled 2021-02-07: qty 16, 56d supply, fill #0

## 2021-02-05 MED ORDER — PROGESTERONE 200 MG PO CAPS
200.0000 mg | ORAL_CAPSULE | Freq: Every day | ORAL | 0 refills | Status: DC
  Filled 2021-02-05: qty 90, 90d supply, fill #0

## 2021-02-05 NOTE — Progress Notes (Signed)
GYNECOLOGY  VISIT   HPI: 46 y.o.   Married  Caucasian  female   [phone] with Patient's last menstrual period was 01/30/2021 (exact date).   here for shorter cycles--only lasting 3 days. She is complaining of hair loss, fatigue and hot flashes. Patient wants to discuss HRT. She would like to start hormone patch.  No skipped cycles.  Experiencing a definite menstrual change. For the last 3 months, period lasts for 3 days only.   Second day is heaviest.  Having night time hot flashes, and using a fan at night.  Experiencing some hair loss in the temple regions.   States her thyroid is in balance.   Notes fatigue and waking up exhausted.  Decreased libido.  Denies depression.  No stress.   Wants to move to [place].   GYNECOLOGIC HISTORY: Patient's last menstrual period was 01/30/2021 (exact date). Contraception:  Vasectomy Menopausal hormone therapy:  none Last mammogram: 02-22-20 Neg/Birads1 Last pap smear:  11-14-20 AGUS;Neg HR HPV, 07-12-17 Neg:Neg HR HPV        OB History     Gravida  5   Para  3   Term      Preterm      AB  2   Living  3      SAB  1   IAB  1   Ectopic      Multiple      Live Births                 Patient Active Problem List   Diagnosis Date Noted   Right foot pain 09/26/2020   Healthcare maintenance 09/26/2020   Sprain of toe 09/26/2020   Pure hypercholesterolemia 03/20/2020   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   Essential (primary) hypertension 09/07/2018   Cavernous hemangioma of brain ([place]) 05/06/2015   Migraine headache 05/06/2015   Hot flashes 03/13/2014   Migraines 11/12/2013   Diplopia 03/02/2012   Hypertropia 03/02/2012   Hypothyroid 10/09/2011   Thyroid nodule 10/02/2011   Diverticulitis 02/01/2009    Past Medical History:  Diagnosis Date   Abnormal Pap smear of cervix 2007   CIN 2 + HRHPV/ LEEP   Allergy    Anemia    Anxiety 2706   Complication of anesthesia    bp dropped with  epidural   Contraception    husband vasectomy   Diverticulosis    Dysmenorrhea    GERD (gastroesophageal reflux disease)    Hashimoto's thyroiditis    Heart murmur    as a child   Hemangioma, intracranial structures ([place]) 2013   Left Frontal Lobe   Hepatitis A    Hypertension    Hypothyroid    right thyroid nodule   Migraines    with aura   Panic disorder 2012   Sinus tachycardia 2012   Vertigo     Past Surgical History:  Procedure Laterality Date   CERVICAL BIOPSY  W/ LOOP ELECTRODE EXCISION  2007   CIN 2   COLPOSCOPY  2007   CIN 2/ HRHPV   DILATION AND CURETTAGE OF UTERUS     SEPTOPLASTY     TONSILLECTOMY AND ADENOIDECTOMY  1988    Current Outpatient Medications  Medication Sig Dispense Refill   fluticasone (FLONASE) 50 MCG/ACT nasal spray Place into both nostrils daily as needed.     loratadine (CLARITIN) 5 MG chewable tablet Chew 5 mg by mouth as needed.      losartan (COZAAR) 25 MG tablet TAKE 1  TABLET BY MOUTH ONCE A DAY 90 tablet 3   Multiple Vitamin (MULTIVITAMIN) tablet Take 1 tablet by mouth every other day.      Rimegepant Sulfate (NURTEC) 75 MG TBDP Take 75 mg by mouth daily as needed. For migraines. Take as close to onset of migraine as possible. One daily maximum. 4 tablet 0   thyroid (ARMOUR THYROID) 15 MG tablet Take one tablet (15 mg dose) by mouth daily. 90 tablet 3   thyroid (ARMOUR THYROID) 60 MG tablet Take 1 tablet by mouth daily (take with 15mg  tablet for a total of 75mg  total daily as directed) 30 tablet 2   triamcinolone ointment (KENALOG) 0.1 % Apply 1 application topically 2 (two) times daily. 30 g 0   Current Facility-Administered Medications  Medication Dose Route Frequency Provider Last Rate Last Admin   0.9 %  sodium chloride infusion  500 mL Intravenous Once Nandigam, Kavitha V, MD         ALLERGIES: Other, Penicillin g, Iodinated contrast media, Monistat [miconazole], Gluten meal, Latex, and Sulfa antibiotics  Family History   Problem Relation Age of Onset   Prostate cancer Father 59       Metastatic CA to bones   Hypertension Father    Alcohol abuse Father    Heart disease Father    Cancer Father        Prostate   Hyperlipidemia Mother    Alcohol abuse Paternal Grandfather    Heart disease Paternal Grandfather    Dementia Maternal Grandmother    Heart disease Maternal Grandfather    Heart disease Paternal Grandmother    Hypertension Brother    Breast cancer Maternal Aunt    Depression Maternal Aunt    Diabetes Maternal Aunt    Heart attack Maternal Uncle    Heart disease Paternal Aunt    Depression Maternal Aunt    Diabetes Maternal Aunt    Heart attack Paternal Uncle    Migraines Neg Hx    Colon cancer Neg Hx    Esophageal cancer Neg Hx    Rectal cancer Neg Hx    Stomach cancer Neg Hx     Social History   Socioeconomic History   Marital status: Married    Spouse name: Lanny Hurst   Number of children: 3   Years of education: 16   Highest education level: Not on file  Occupational History   Occupation: [employer]  Tobacco Use   Smoking status: Never   Smokeless tobacco: Never  Vaping Use   Vaping Use: Never used  Substance and Sexual Activity   Alcohol use: Yes    Alcohol/week: 10.0 - 14.0 standard drinks    Types: 10 - 14 Glasses of wine per week    Comment: 1-2 five oz glasses of wine/day   Drug use: No   Sexual activity: Yes    Partners: Male    Birth control/protection: Other-see comments    Comment: vasectomy-husband  Other Topics Concern   Not on file  Social History Narrative   Lives with husband, children   Caffeine use:  1 cup coffee/day   Originally from [place], grew up in [place] Strain: Not on [employer] Insecurity: Not on file  Transportation Needs: Not on file  Physical Activity: Not on file  Stress: Not on file  Social Connections: Not on file  Intimate Partner Violence: Not on file    Review of Systems   Constitutional:  Positive for fatigue.  Skin:        Hair loss  All other systems reviewed and are negative.  PHYSICAL EXAMINATION:    BP 122/86    Pulse (!) 102    Ht 5' 3.5" (1.613 m)    Wt 189 lb (85.7 kg)    LMP 01/30/2021 (Exact Date)    SpO2 98%    BMI 32.95 kg/m     General appearance: alert, cooperative and appears stated age              ASSESSMENT  Menopausal symptoms.  Decreased libido. Migraine HA with aura.  HTN.   PLAN  Will check FSH, estradiol, free T4, TSH, and BMP. We discussed perimenopausal symptoms and treatment options:  HRT, SSRI/SNRI, Gabapentin.  Will try an estradiol patch 0.5 mg twice weekly and Prometrium 200 mg nightly.   Discused WHI and use of HRT which can increase risk of PE, DVT, MI, stroke and breast cancer.  Brochure on stages of menopause. FU for a recheck in 2 months.    An After Visit Summary was printed and given to the patient.  32 min  total time was spent for this patient encounter, including preparation, face-to-face counseling with the patient, coordination of care, and documentation of the encounter.

## 2021-02-05 NOTE — Patient Instructions (Signed)

## 2021-02-05 NOTE — Therapy (Addendum)
[facility] [place] [place], [place], [address] Phone: [phone]   Fax:  [phone]  Physical Therapy Treatment  Patient Details  Name: Emily Barker MRN: [id number] Date of Birth: 07-06-1975 Referring Provider (PT): Nunzio Cobbs, MD   Encounter Date: 02/04/2021   PT End of Session - 02/04/21 1501     Visit Number 2    Date for PT Re-Evaluation 04/22/21    Authorization Type [employer] employee    PT Start Time 1444    PT Stop Time 1527    PT Time Calculation (min) 43 min    Activity Tolerance Patient tolerated treatment well    Behavior During Therapy [facility] for tasks assessed/performed             Past Medical History:  Diagnosis Date   Abnormal Pap smear of cervix 2007   CIN 2 + HRHPV/ LEEP   Allergy    Anemia    Anxiety 5284   Complication of anesthesia    bp dropped with epidural   Contraception    husband vasectomy   Diverticulosis    Dysmenorrhea    GERD (gastroesophageal reflux disease)    Hashimoto's thyroiditis    Heart murmur    as a child   Hemangioma, intracranial structures ([place]) 2013   Left Frontal Lobe   Hepatitis A    Hypertension    Hypothyroid    right thyroid nodule   Migraines    Panic disorder 2012   Sinus tachycardia 2012   Vertigo     Past Surgical History:  Procedure Laterality Date   CERVICAL BIOPSY  W/ LOOP ELECTRODE EXCISION  2007   CIN 2   COLPOSCOPY  2007   CIN 2/ HRHPV   DILATION AND CURETTAGE OF UTERUS     SEPTOPLASTY     TONSILLECTOMY AND ADENOIDECTOMY  1988    There were no vitals filed for this visit.   Subjective Assessment - 02/04/21 1444     Subjective Pt states that she feels a little stronger with PFMC; she did do some every day, but not as many as she was supposed to.    Currently in Pain? No/denies                               [facility] Adult PT Treatment/Exercise - 02/05/21 0001       Neuro Re-ed     Neuro Re-ed Details  sEMG biofeedback used during treatment      Exercises   Exercises Knee/Hip;Lumbar;Other Exercises    Other Exercises  PFM contractions: quick flicks in supine, quadruped, puppy position (due to wrist pain), flexed standing, tandem stance,      Lumbar Exercises: Stretches   Other Lumbar Stretch Exercise SKTC 10x; LTR 10x      Lumbar Exercises: Standing   Functional Squats 10 reps   With [facility]                    PT Education - 02/04/21 1500     Education Details Pt education performed on PFM relaxation/contraction training with use of sEMG biofeedback; HEP updated to include tandem stance PFMC and PFMC with squat    Person(s) Educated Patient    Methods Explanation;Tactile cues;Verbal cues;Demonstration    Comprehension Verbalized understanding              PT Short Term Goals -  01/28/21 1655       PT SHORT TERM GOAL #1   Title Pt will be able to sustain pelvic floor contraction for 10 seconds    Time 6    Period Weeks    Status New    Target Date 03/11/21      PT SHORT TERM GOAL #2   Title Pt will report 50% less leakage with coughing and sneezing    Time 6    Period Weeks    Status New    Target Date 03/11/21               PT Long Term Goals - 01/28/21 1615       PT LONG TERM GOAL #1   Title Pt will stop feeling like she has to pee and be able to not think about it for at least 2-3 hours    Time 12    Period Weeks    Status New    Target Date 04/22/21      PT LONG TERM GOAL #2   Title Pt will be able to do weight lifting exercises without leaking    Time 12    Period Weeks    Status New    Target Date 04/22/21      PT LONG TERM GOAL #3   Title Pt will be able do 10 sumo squats without leaking or pee.    Time 12    Period Weeks    Status New    Target Date 04/22/21      PT LONG TERM GOAL #4   Title Pt will report feeling the bladder urges at least 50% less frequently.    Time 12    Period Weeks    Status New     Target Date 04/22/21                   Plan - 02/04/21 1502     Clinical Impression Statement Pt at initial follow up today and states she feels a little more control with doing the kegels at home. Pt used biofeedback in various positions and did well with program giving her spaced out cues to engaged the pelvic floor.  Pt was also educated in the position that is ideal for knack so that she can better control coughing/sneezing. Pt was given updates to HEP and will benefit from PT to continue per POC.    PT Treatment/Interventions ADLs/Self Care Home Management;Biofeedback;Cryotherapy;Electrical Stimulation;Moist Heat;Therapeutic activities;Therapeutic exercise;Neuromuscular re-education;Patient/family education;Manual techniques;Dry needling;Taping    PT Next Visit Plan Continue to perform PFM strengthening/coordination program; begin to incorporate whole body strengthening; review the knack; consider bladder retraining program if no improvement in sxs    PT Home Exercise Plan Access Code: [email or web address]    Consulted and Agree with Plan of Care Patient             Patient will benefit from skilled therapeutic intervention in order to improve the following deficits and impairments:  Postural dysfunction, Decreased coordination, Decreased strength, Decreased endurance  Visit Diagnosis: Muscle weakness (generalized)  Abnormal posture     Problem List Patient Active Problem List   Diagnosis Date Noted   Right foot pain 09/26/2020   Healthcare maintenance 09/26/2020   Sprain of toe 09/26/2020   Pure hypercholesterolemia 03/20/2020   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   Essential (primary) hypertension 09/07/2018   Cavernous hemangioma of brain ([place]) 05/06/2015   Migraine headache 05/06/2015   Hot flashes 03/13/2014  Migraines 11/12/2013   Diplopia 03/02/2012   Hypertropia 03/02/2012   Hypothyroid 10/09/2011   Thyroid nodule 10/02/2011    Diverticulitis 02/01/2009    Jule Ser, PT 02/05/2021, 8:30 AM  [facility] @ [facility] [place], [place], [address] Phone: [phone]   Fax:  [phone]  Name: Emily Barker MRN: [id number] Date of Birth: 1975/04/27   PHYSICAL THERAPY DISCHARGE SUMMARY  Visits from Start of Care: 2  Current functional level related to goals / functional outcomes:   See above goals Remaining deficits: See above   Education / Equipment: HEP   Patient agrees to discharge. Patient goals were not met. Patient is being discharged due to not returning since the last visit.  Gustavus Bryant, PT 07/10/21 9:16 AM

## 2021-02-06 LAB — BASIC METABOLIC PANEL
BUN: 18 mg/dL (ref 7–25)
CO2: 25 mmol/L (ref 20–32)
Calcium: 10.1 mg/dL (ref 8.6–10.2)
Chloride: 103 mmol/L (ref 98–110)
Creat: 0.92 mg/dL (ref 0.50–0.99)
Glucose, Bld: 91 mg/dL (ref 65–99)
Potassium: 4.2 mmol/L (ref 3.5–5.3)
Sodium: 138 mmol/L (ref 135–146)

## 2021-02-06 LAB — FOLLICLE STIMULATING HORMONE: FSH: 8 m[IU]/mL

## 2021-02-06 LAB — T4, FREE: Free T4: 1 ng/dL (ref 0.8–1.8)

## 2021-02-06 LAB — TSH: TSH: 1.49 mIU/L

## 2021-02-06 LAB — ESTRADIOL: Estradiol: 104 pg/mL

## 2021-02-07 ENCOUNTER — Other Ambulatory Visit (HOSPITAL_COMMUNITY): Payer: Self-pay

## 2021-02-08 ENCOUNTER — Other Ambulatory Visit (HOSPITAL_COMMUNITY): Payer: Self-pay

## 2021-02-08 MED FILL — Losartan Potassium Tab 25 MG: ORAL | 90 days supply | Qty: 90 | Fill #3 | Status: CN

## 2021-02-10 ENCOUNTER — Other Ambulatory Visit (HOSPITAL_COMMUNITY): Payer: Self-pay

## 2021-02-10 MED FILL — Losartan Potassium Tab 25 MG: ORAL | 60 days supply | Qty: 60 | Fill #3 | Status: AC

## 2021-02-11 ENCOUNTER — Telehealth: Payer: Self-pay | Admitting: Physical Therapy

## 2021-02-11 ENCOUNTER — Encounter: Payer: 59 | Admitting: Physical Therapy

## 2021-02-11 NOTE — Telephone Encounter (Signed)
Pt tried to call to cancel due to work emergency, but phones were not working when she called.  Gustavus Bryant, PT 02/11/21 3:49 PM

## 2021-02-12 ENCOUNTER — Ambulatory Visit: Payer: 59 | Admitting: Rheumatology

## 2021-02-28 ENCOUNTER — Encounter: Payer: 59 | Admitting: Physical Therapy

## 2021-03-07 ENCOUNTER — Encounter: Payer: 59 | Admitting: Physical Therapy

## 2021-03-10 ENCOUNTER — Other Ambulatory Visit (HOSPITAL_COMMUNITY): Payer: Self-pay

## 2021-03-11 ENCOUNTER — Encounter: Payer: 59 | Admitting: Physical Therapy

## 2021-03-11 ENCOUNTER — Telehealth: Payer: Self-pay | Admitting: Family Medicine

## 2021-03-11 NOTE — Telephone Encounter (Signed)
Faxed PA with OV notes to [facility].

## 2021-03-13 ENCOUNTER — Other Ambulatory Visit (HOSPITAL_COMMUNITY): Payer: Self-pay

## 2021-03-17 ENCOUNTER — Encounter: Payer: 59 | Admitting: Physical Therapy

## 2021-03-18 ENCOUNTER — Ambulatory Visit: Payer: 59 | Admitting: Family Medicine

## 2021-03-18 ENCOUNTER — Encounter: Payer: 59 | Admitting: Physical Therapy

## 2021-03-24 NOTE — Progress Notes (Signed)
03/24/21 ALL:  Emily Barker returns for Botx. She continues to do well. She reports migraines are well managed for about 10 weeks following Botox procedure. Nurtec works for abortive therapy. She requests that we administer frontalis injections lower in the forehead and to omit cervical and trapezius injections. She states she had more pain in the neck after last procedure and feels it was due to Botox injections. She was educated on the possibility of forehead drooping with lowering frontalis injections and verbalizes understanding.   12/25/2020 ALL:  She returns for Botox. She continues Nurtec for abortive therapy. She is doing very well on Botox therapy.   From Dr Cathren Laine last note 09/09/2020 02/20/2020 AA: She is still doing great. But it wore off, wasn;t as effective higher up in the forehead but be careful for forehead drooping we did it 3/4 up the forehead instead. +temporals. NO masseters. Will try nurtec.She loved nurtec has only had to use one. +5Orb Oculi. +Temporalis. Also +5U occipital emergence and +10 bilat U cervical paraspinals, ask her to show where to place the extras in the cervical muscles she has a few spots in her lower cervical spine - ask. She goes to [employer]   No orders of the defined types were placed in this encounter.   08/08/2019 AA: She is still doing great. But it wore off, wasn;t as effective higher up in the forehead but be careful for forehead drooping we did it 3/4 up the forehead instead. +temporals. NO masseters.   Consent Form Botulism Toxin Injection For Chronic Migraine   Reviewed orally with patient, additionally signature is on file:  Botulism toxin has been approved by the Federal drug administration for treatment of chronic migraine. Botulism toxin does not cure chronic migraine and it may not be effective in some patients.  The administration of botulism toxin is accomplished by injecting a small amount of toxin into the muscles of the neck  and head. Dosage must be titrated for each individual. Any benefits resulting from botulism toxin tend to wear off after 3 months with a repeat injection required if benefit is to be maintained. Injections are usually done every 3-4 months with maximum effect peak achieved by about 2 or 3 weeks. Botulism toxin is expensive and you should be sure of what costs you will incur resulting from the injection.  The side effects of botulism toxin use for chronic migraine may include:   -Transient, and usually mild, facial weakness with facial injections  -Transient, and usually mild, head or neck weakness with head/neck injections  -Reduction or loss of forehead facial animation due to forehead muscle weakness  -Eyelid drooping  -Dry eye  -Pain at the site of injection or bruising at the site of injection  -Double vision  -Potential unknown long term risks   Contraindications: You should not have Botox if you are pregnant, nursing, allergic to albumin, have an infection, skin condition, or muscle weakness at the site of the injection, or have myasthenia gravis, Lambert-Eaton syndrome, or ALS.  It is also possible that as with any injection, there may be an allergic reaction or no effect from the medication. Reduced effectiveness after repeated injections is sometimes seen and rarely infection at the injection site may occur. All care will be taken to prevent these side effects. If therapy is given over a long time, atrophy and wasting in the muscle injected may occur. Occasionally the patient's become refractory to treatment because they develop antibodies to the toxin.  In this event, therapy needs to be modified.  I have read the above information and consent to the administration of botulism toxin.    BOTOX PROCEDURE NOTE FOR MIGRAINE HEADACHE  Contraindications and precautions discussed with patient(above). Aseptic procedure was observed and patient tolerated procedure. Procedure performed by Debbora Presto, FNP-C.   The condition has existed for more than 6 months, and pt does not have a diagnosis of ALS, Myasthenia Gravis or Lambert-Eaton Syndrome.  Risks and benefits of injections discussed and pt agrees to proceed with the procedure.  Written consent obtained  These injections are medically necessary. Pt  receives good benefits from these injections. These injections do not cause sedations or hallucinations which the oral therapies may cause.   Description of procedure:  The patient was placed in a sitting position. The standard protocol was used for Botox as follows, with 5 units of Botox injected at each site:  -Procerus muscle, midline injection  -Corrugator muscle, bilateral injection  -Frontalis muscle, bilateral injection, with 2 sites each side, medial injection was performed in the upper half of the frontalis muscle, in the region vertical from the medial inferior edge of the superior orbital rim. The lateral injection was again in the upper one third of the forehead vertically above the lateral limbus of the cornea, 1.5 cm lateral to the medial injection site.  -Temporalis muscle injection, 4 sites, bilaterally. The first injection was 3 cm above the tragus of the ear, second injection site was 1.5 cm to 3 cm up from the first injection site in line with the tragus of the ear. The third injection site was 1.5-3 cm forward between the first 2 injection sites. The fourth injection site was 1.5 cm posterior to the second injection site. 5th site laterally in the temporalis  muscleat the level of the outer canthus.  -Occipitalis muscle injection, 3 sites, bilaterally. The first injection was done one half way between the occipital protuberance and the tip of the mastoid process behind the ear. The second injection site was done lateral and superior to the first, 1 fingerbreadth from the first injection. The third injection site was 1 fingerbreadth superiorly and medially from the first  injection site.     Will return for repeat injection in 3 months.   A total of 200 units of Botox was prepared, 105 units of Botox was injected as documented above, 95 units of Botox wasted. The patient tolerated the procedure well, there were no complications of the above procedure.

## 2021-03-25 ENCOUNTER — Other Ambulatory Visit: Payer: Self-pay | Admitting: Obstetrics and Gynecology

## 2021-03-25 ENCOUNTER — Ambulatory Visit: Payer: 59 | Admitting: Family Medicine

## 2021-03-25 DIAGNOSIS — G43709 Chronic migraine without aura, not intractable, without status migrainosus: Secondary | ICD-10-CM | POA: Diagnosis not present

## 2021-03-25 DIAGNOSIS — Z1231 Encounter for screening mammogram for malignant neoplasm of breast: Secondary | ICD-10-CM

## 2021-03-25 NOTE — Progress Notes (Signed)
Botox- 200 units x 1 vial Lot: [id number] Expiration: 04/2023 NDC: 0097-9499-71  Bacteriostatic 0.9% Sodium Chloride- 77mL total Lot: [id number] Expiration: 08/27/2022 NDC: 6893-4068-40  Dx: T35.331 B/B

## 2021-04-02 ENCOUNTER — Other Ambulatory Visit: Payer: Self-pay

## 2021-04-02 ENCOUNTER — Ambulatory Visit
Admission: RE | Admit: 2021-04-02 | Discharge: 2021-04-02 | Disposition: A | Discharge status: Home / Self Care | Payer: 59 | Source: Ambulatory Visit | Attending: Obstetrics and Gynecology | Admitting: Obstetrics and Gynecology

## 2021-04-02 DIAGNOSIS — Z1231 Encounter for screening mammogram for malignant neoplasm of breast: Secondary | ICD-10-CM | POA: Diagnosis not present

## 2021-04-06 ENCOUNTER — Other Ambulatory Visit: Payer: Self-pay | Admitting: Nurse Practitioner

## 2021-04-06 ENCOUNTER — Other Ambulatory Visit (HOSPITAL_COMMUNITY): Payer: Self-pay

## 2021-04-06 DIAGNOSIS — I10 Essential (primary) hypertension: Secondary | ICD-10-CM

## 2021-04-07 ENCOUNTER — Other Ambulatory Visit: Payer: Self-pay | Admitting: Obstetrics and Gynecology

## 2021-04-07 ENCOUNTER — Other Ambulatory Visit (HOSPITAL_COMMUNITY): Payer: Self-pay

## 2021-04-07 MED ORDER — ARMOUR THYROID 60 MG PO TABS
ORAL_TABLET | ORAL | 2 refills | Status: DC
  Filled 2021-04-07: qty 30, 30d supply, fill #0
  Filled 2021-05-11: qty 30, 30d supply, fill #1
  Filled 2021-06-08: qty 30, 30d supply, fill #2

## 2021-04-07 MED ORDER — LOSARTAN POTASSIUM 25 MG PO TABS
ORAL_TABLET | Freq: Every day | ORAL | 0 refills | Status: DC
  Filled 2021-04-07: qty 90, 90d supply, fill #0

## 2021-04-07 NOTE — Telephone Encounter (Signed)
Refill sent with no additional refills, pt is due for f/u appointment. LVM informing patient to call and schedule appointment for additional refills.  ?

## 2021-04-08 ENCOUNTER — Encounter: Payer: Self-pay | Admitting: Obstetrics and Gynecology

## 2021-04-08 DIAGNOSIS — L720 Epidermal cyst: Secondary | ICD-10-CM | POA: Diagnosis not present

## 2021-04-09 ENCOUNTER — Other Ambulatory Visit (HOSPITAL_COMMUNITY): Payer: Self-pay

## 2021-04-17 ENCOUNTER — Other Ambulatory Visit: Payer: Self-pay

## 2021-04-17 ENCOUNTER — Other Ambulatory Visit (HOSPITAL_COMMUNITY): Payer: Self-pay

## 2021-04-17 ENCOUNTER — Ambulatory Visit: Payer: 59 | Admitting: Obstetrics and Gynecology

## 2021-04-17 ENCOUNTER — Encounter: Payer: Self-pay | Admitting: Obstetrics and Gynecology

## 2021-04-17 VITALS — BP 108/80 | HR 97 | Ht 63.5 in | Wt 189.0 lb

## 2021-04-17 DIAGNOSIS — Z79890 Hormone replacement therapy (postmenopausal): Secondary | ICD-10-CM

## 2021-04-17 DIAGNOSIS — N951 Menopausal and female climacteric states: Secondary | ICD-10-CM

## 2021-04-17 MED ORDER — PROGESTERONE 200 MG PO CAPS
200.0000 mg | ORAL_CAPSULE | Freq: Every day | ORAL | 2 refills | Status: DC
  Filled 2021-04-17: qty 90, 90d supply, fill #0
  Filled 2021-07-04: qty 90, 90d supply, fill #1
  Filled 2021-11-07: qty 90, 90d supply, fill #2

## 2021-04-17 MED ORDER — ESTRADIOL 0.05 MG/24HR TD PTTW
1.0000 | MEDICATED_PATCH | TRANSDERMAL | 2 refills | Status: DC
  Filled 2021-04-17: qty 24, 84d supply, fill #0
  Filled 2021-07-04: qty 24, 84d supply, fill #1
  Filled 2021-09-17: qty 24, 84d supply, fill #2

## 2021-04-17 NOTE — Progress Notes (Signed)
GYNECOLOGY  VISIT ?  ?HPI: ?46 y.o.   Married  Caucasian  female   ?[id number] with Patient's last menstrual period was 03/19/2021.   ?here for medication follow up.   ? ?She was seen on 02/05/21 for hot flashes, fatigue, and hair loss.  ?Normal thyroid function per patient.  ?FSH 8.0. ?Estradiol 104. ? ?She started estradiol patch 0.5 mg twice weekly and Prometrium 200 mg nightly.  ?Feeling better.  ?Brain fog is gone.  ?No more joint and muscle pain.   Work outs not hurting.  ?No more extreme fatigue and does not feel as angry.  ?Has less hot flashes.  Still has them but not as frequently.  ?Sleeping great.  ?Less loss.  ?She is happy where she is at right now and happy that the dosage of HRT is not high.  ?She wants to continue her HRT.  ? ?Minimal skin irritation with her estrogen patch. ? ?No bleeding in between her periods.  ?Her cycles lasted on 3 days instead of 4.  ? ?GYNECOLOGIC HISTORY: ?Patient's last menstrual period was 03/19/2021. ?Contraception:  Vasectomy ?Menopausal hormone therapy:  none ?Last mammogram:  04-02-21 Neg/Birads1 ?Last pap smear:   11-14-20 AGUS;Neg HR HPV, 07-12-17 Neg:Neg HR HPV ?       ?OB History   ? ? Gravida  ?5  ? Para  ?3  ? Term  ?   ? Preterm  ?   ? AB  ?2  ? Living  ?3  ?  ? ? SAB  ?1  ? IAB  ?1  ? Ectopic  ?   ? Multiple  ?   ? Live Births  ?   ?   ?  ?  ?    ? ?Patient Active Problem List  ? Diagnosis Date Noted  ? Right foot pain 09/26/2020  ? Healthcare maintenance 09/26/2020  ? Sprain of toe 09/26/2020  ? Pure hypercholesterolemia 03/20/2020  ? 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 Essential (primary) hypertension 09/07/2018  ? Cavernous hemangioma of brain ([place]) 05/06/2015  ? Migraine headache 05/06/2015  ? Hot flashes 03/13/2014  ? Migraines 11/12/2013  ? Diplopia 03/02/2012  ? Hypertropia 03/02/2012  ? Hypothyroid 10/09/2011  ? Thyroid nodule 10/02/2011  ? Diverticulitis 02/01/2009  ? ? ?Past Medical History:  ?Diagnosis Date  ? Abnormal Pap smear of  cervix 2007  ? CIN 2 + HRHPV/ LEEP  ? Allergy   ? Anemia   ? Anxiety 2012  ? Complication of anesthesia   ? bp dropped with epidural  ? Contraception   ? husband vasectomy  ? Diverticulosis   ? Dysmenorrhea   ? GERD (gastroesophageal reflux disease)   ? Hashimoto's thyroiditis   ? Heart murmur   ? as a child  ? Hemangioma, intracranial structures ([place]) 2013  ? Left Frontal Lobe  ? Hepatitis A   ? Hypertension   ? Hypothyroid   ? right thyroid nodule  ? Migraines   ? with aura  ? Panic disorder 2012  ? Sinus tachycardia 2012  ? Vertigo   ? ? ?Past Surgical History:  ?Procedure Laterality Date  ? CERVICAL BIOPSY  W/ LOOP ELECTRODE EXCISION  2007  ? CIN 2  ? COLPOSCOPY  2007  ? CIN 2/ HRHPV  ? DILATION AND CURETTAGE OF UTERUS    ? SEPTOPLASTY    ? TONSILLECTOMY AND ADENOIDECTOMY  1988  ? ? ?Current Outpatient Medications  ?Medication Sig Dispense Refill  ? estradiol (VIVELLE-DOT) 0.05 MG/24HR  patch Place 1 patch onto the skin 2 times a week. 24 patch 0  ? fluticasone (FLONASE) 50 MCG/ACT nasal spray Place into both nostrils daily as needed.    ? loratadine (CLARITIN) 5 MG chewable tablet Chew 5 mg by mouth as needed.     ? losartan (COZAAR) 25 MG tablet TAKE 1 TABLET BY MOUTH ONCE A DAY 90 tablet 0  ? Multiple Vitamin (MULTIVITAMIN) tablet Take 1 tablet by mouth every other day.     ? progesterone (PROMETRIUM) 200 MG capsule Take 1 capsule (200 mg total) by mouth daily. Take at bedtime. 90 capsule 0  ? Rimegepant Sulfate (NURTEC) 75 MG TBDP Take 75 mg by mouth daily as needed. For migraines. Take as close to onset of migraine as possible. One daily maximum. 4 tablet 0  ? thyroid (ARMOUR THYROID) 15 MG tablet Take one tablet (15 mg dose) by mouth daily. 90 tablet 3  ? thyroid (ARMOUR THYROID) 60 MG tablet Take 1 tablet by mouth daily (take with '15mg'$  tablet for a total of '75mg'$  total daily as directed) 30 tablet 2  ? triamcinolone ointment (KENALOG) 0.1 % Apply 1 application topically 2 (two) times daily. 30 g 0   ? ?Current Facility-Administered Medications  ?Medication Dose Route Frequency Provider Last Rate Last Admin  ? 0.9 %  sodium chloride infusion  500 mL Intravenous Once Nandigam, Kavitha V, MD      ?  ? ?ALLERGIES: Gluten meal, Miconazole, Other, Penicillin g, Latex, Iodinated contrast media, and Sulfa antibiotics ? ?Family History  ?Problem Relation Age of Onset  ? Hyperlipidemia Mother   ? Prostate cancer Father 74  ?     Metastatic CA to bones  ? Hypertension Father   ? Alcohol abuse Father   ? Heart disease Father   ? Cancer Father   ?     Prostate  ? Breast cancer Maternal Aunt 42  ? Depression Maternal Aunt   ? Diabetes Maternal Aunt   ? Depression Maternal Aunt   ? Diabetes Maternal Aunt   ? Heart attack Maternal Uncle   ? Heart disease Paternal Aunt   ? Heart attack Paternal Uncle   ? Dementia Maternal Grandmother   ? Heart disease Maternal Grandfather   ? Heart disease Paternal Grandmother   ? Alcohol abuse Paternal Grandfather   ? Heart disease Paternal Grandfather   ? Hypertension Brother   ? Migraines Neg Hx   ? Colon cancer Neg Hx   ? Esophageal cancer Neg Hx   ? Rectal cancer Neg Hx   ? Stomach cancer Neg Hx   ? ? ?Social History  ? ?Socioeconomic History  ? Marital status: Married  ?  Spouse name: Lanny Hurst  ? Number of children: 3  ? Years of education: 65  ? Highest education level: Not on file  ?Occupational History  ? Occupation: [employer]  ?Tobacco Use  ? Smoking status: Never  ? Smokeless tobacco: Never  ?Vaping Use  ? Vaping Use: Never used  ?Substance and Sexual Activity  ? Alcohol use: Yes  ?  Alcohol/week: 10.0 - 14.0 standard drinks  ?  Types: 10 - 14 Glasses of wine per week  ?  Comment: 1-2 five oz glasses of wine/day  ? Drug use: No  ? Sexual activity: Yes  ?  Partners: Male  ?  Birth control/protection: Other-see comments  ?  Comment: vasectomy-husband  ?Other Topics Concern  ? Not on file  ?Social History Narrative  ? Lives with  husband, children  ? Caffeine use:  1 cup coffee/day  ?  Originally from [place], grew up in [place]  ? ?Social Determinants of Health  ? ?Financial Resource Strain: Not on file  ?Food Insecurity: Not on file  ?Transportation Needs: Not on file  ?Physical Activity: Not on file  ?Stress: Not on file  ?Social Connections: Not on file  ?Intimate Partner Violence: Not on file  ? ? ?Review of Systems  ?All other systems reviewed and are negative. ? ?PHYSICAL EXAMINATION:   ? ?BP 108/80   Pulse 97   Ht 5' 3.5" (1.613 m)   Wt 189 lb (85.7 kg)   LMP 03/19/2021   SpO2 98%   BMI 32.95 kg/m?     ?General appearance: alert, cooperative and appears stated age ? ?ASSESSMENT ? ?HRT.  ?Perimenopausal symptoms improved on HRT. ? ?PLAN ? ?Continue Vivelle Dot 0.05 mg twice weekly.  #24, RF 2. ?She declines switch to [place].  ?Continue Prometrium 200 mg nightly.  #90, RF 2.  ?Fu for annual exam and prn.  ?  ?An After Visit Summary was printed and given to the patient. ? ?25 min  total time was spent for this patient encounter, including preparation, face-to-face counseling with the patient, coordination of care, and documentation of the encounter. ? ? ?

## 2021-04-21 ENCOUNTER — Other Ambulatory Visit (HOSPITAL_COMMUNITY): Payer: Self-pay

## 2021-05-12 ENCOUNTER — Other Ambulatory Visit (HOSPITAL_COMMUNITY): Payer: Self-pay

## 2021-05-13 ENCOUNTER — Ambulatory Visit: Payer: 59 | Admitting: Nurse Practitioner

## 2021-06-04 ENCOUNTER — Ambulatory Visit (INDEPENDENT_AMBULATORY_CARE_PROVIDER_SITE_OTHER): Payer: 59 | Admitting: Nurse Practitioner

## 2021-06-04 ENCOUNTER — Encounter: Payer: Self-pay | Admitting: Nurse Practitioner

## 2021-06-04 VITALS — BP 120/74 | HR 88 | Temp 97.4°F | Ht 63.75 in | Wt 192.2 lb

## 2021-06-04 DIAGNOSIS — E063 Autoimmune thyroiditis: Secondary | ICD-10-CM

## 2021-06-04 DIAGNOSIS — D1802 Hemangioma of intracranial structures: Secondary | ICD-10-CM

## 2021-06-04 DIAGNOSIS — Z0001 Encounter for general adult medical examination with abnormal findings: Secondary | ICD-10-CM

## 2021-06-04 DIAGNOSIS — E7800 Pure hypercholesterolemia, unspecified: Secondary | ICD-10-CM

## 2021-06-04 DIAGNOSIS — H9190 Unspecified hearing loss, unspecified ear: Secondary | ICD-10-CM | POA: Insufficient documentation

## 2021-06-04 LAB — COMPREHENSIVE METABOLIC PANEL
ALT: 14 U/L (ref 0–35)
AST: 18 U/L (ref 0–37)
Albumin: 4.6 g/dL (ref 3.5–5.2)
Alkaline Phosphatase: 73 U/L (ref 39–117)
BUN: 12 mg/dL (ref 6–23)
CO2: 27 mEq/L (ref 19–32)
Calcium: 9.8 mg/dL (ref 8.4–10.5)
Chloride: 101 mEq/L (ref 96–112)
Creatinine, Ser: 0.84 mg/dL (ref 0.40–1.20)
GFR: 82.49 mL/min (ref >60.00)
Glucose, Bld: 92 mg/dL (ref 70–99)
Potassium: 4.1 mEq/L (ref 3.5–5.1)
Sodium: 136 mEq/L (ref 135–145)
Total Bilirubin: 0.5 mg/dL (ref 0.2–1.2)
Total Protein: 7.8 g/dL (ref 6.0–8.3)

## 2021-06-04 LAB — CBC
HCT: 40.9 % (ref 36.0–46.0)
Hemoglobin: 13.9 g/dL (ref 12.0–15.0)
MCHC: 33.9 g/dL (ref 30.0–36.0)
MCV: 84.9 fl (ref 78.0–100.0)
Platelets: 226 10*3/uL (ref 150.0–400.0)
RBC: 4.82 Mil/uL (ref 3.87–5.11)
RDW: 13.4 % (ref 11.5–15.5)
WBC: 6.7 10*3/uL (ref 4.0–10.5)

## 2021-06-04 LAB — LIPID PANEL
Cholesterol: 215 mg/dL — ABNORMAL HIGH (ref 0–200)
HDL: 57.8 mg/dL (ref >39.00)
LDL Cholesterol: 135 mg/dL — ABNORMAL HIGH (ref 0–99)
NonHDL: 157.56
Total CHOL/HDL Ratio: 4
Triglycerides: 114 mg/dL (ref 0.0–149.0)
VLDL: 22.8 mg/dL (ref 0.0–40.0)

## 2021-06-04 NOTE — Patient Instructions (Addendum)
[facility]. ? ?Go to lab ? ?Preventive Care 48-48 Years Old, Female ?Preventive care refers to lifestyle choices and visits with your health care provider that can promote health and wellness. Preventive care visits are also called wellness exams. ?What can I expect for my preventive care visit? ?Counseling ?Your health care provider may ask you questions about your: ?Medical history, including: ?Past medical problems. ?Family medical history. ?Pregnancy history. ?Current health, including: ?Menstrual cycle. ?Method of birth control. ?Emotional well-being. ?Home life and relationship well-being. ?Sexual activity and sexual health. ?Lifestyle, including: ?Alcohol, nicotine or tobacco, and drug use. ?Access to firearms. ?Diet, exercise, and sleep habits. ?Work and work Statistician. ?Sunscreen use. ?Safety issues such as seatbelt and bike helmet use. ?Physical exam ?Your health care provider will check your: ?Height and weight. These may be used to calculate your BMI (body mass index). BMI is a measurement that tells if you are at a healthy weight. ?Waist circumference. This measures the distance around your waistline. This measurement also tells if you are at a healthy weight and may help predict your risk of certain diseases, such as type 2 diabetes and high blood pressure. ?Heart rate and blood pressure. ?Body temperature. ?Skin for abnormal spots. ?What immunizations do I need? ? ?Vaccines are usually given at various ages, according to a schedule. Your health care provider will recommend vaccines for you based on your age, medical history, and lifestyle or other factors, such as travel or where you work. ?What tests do I need? ?Screening ?Your health care provider may recommend screening tests for certain conditions. This may include: ?Lipid and cholesterol levels. ?Diabetes screening. This is done by checking your blood sugar (glucose) after you have not eaten for a while (fasting). ?Pelvic  exam and Pap test. ?Hepatitis B test. ?Hepatitis C test. ?HIV (human immunodeficiency virus) test. ?STI (sexually transmitted infection) testing, if you are at risk. ?Lung cancer screening. ?Colorectal cancer screening. ?Mammogram. Talk with your health care provider about when you should start having regular mammograms. This may depend on whether you have a family history of breast cancer. ?BRCA-related cancer screening. This may be done if you have a family history of breast, ovarian, tubal, or peritoneal cancers. ?Bone density scan. This is done to screen for osteoporosis. ?Talk with your health care provider about your test results, treatment options, and if necessary, the need for more tests. ?Follow these instructions at home: ?Eating and drinking ? ?Eat a diet that includes fresh fruits and vegetables, whole grains, lean protein, and low-fat dairy products. ?Take vitamin and mineral supplements as recommended by your health care provider. ?Do not drink alcohol if: ?Your health care provider tells you not to drink. ?You are pregnant, may be pregnant, or are planning to become pregnant. ?If you drink alcohol: ?Limit how much you have to 0-1 drink a day. ?Know how much alcohol is in your drink. In the [place] one drink equals one 12 oz bottle of beer (355 mL), one 5 oz glass of wine (148 mL), or one 1? oz glass of hard liquor (44 mL). ?Lifestyle ?Brush your teeth every morning and night with fluoride toothpaste. Floss one time each day. ?Exercise for at least 30 minutes 5 or more days each week. ?Do not use any products that contain nicotine or tobacco. These products include cigarettes, chewing tobacco, and vaping devices, such as e-cigarettes. If you need help quitting, ask your health care provider. ?Do not use drugs. ?If you are sexually active, practice safe sex. Use  a condom or other form of protection to prevent STIs. ?If you do not wish to become pregnant, use a form of birth control. If you plan to become  pregnant, see your health care provider for a prepregnancy visit. ?Take aspirin only as told by your health care provider. Make sure that you understand how much to take and what form to take. Work with your health care provider to find out whether it is safe and beneficial for you to take aspirin daily. ?Find healthy ways to manage stress, such as: ?Meditation, yoga, or listening to music. ?Journaling. ?Talking to a trusted person. ?Spending time with friends and family. ?Minimize exposure to UV radiation to reduce your risk of skin cancer. ?Safety ?Always wear your seat belt while driving or riding in a vehicle. ?Do not drive: ?If you have been drinking alcohol. Do not ride with someone who has been drinking. ?When you are tired or distracted. ?While texting. ?If you have been using any mind-altering substances or drugs. ?Wear a helmet and other protective equipment during sports activities. ?If you have firearms in your house, make sure you follow all gun safety procedures. ?Seek help if you have been physically or sexually abused. ?What's next? ?Visit your health care provider once a year for an annual wellness visit. ?Ask your health care provider how often you should have your eyes and teeth checked. ?Stay up to date on all vaccines. ?This information is not intended to replace advice given to you by your health care provider. Make sure you discuss any questions you have with your health care provider. ?Document Revised: 07/10/2020 Document Reviewed: 07/10/2020 ?Elsevier Patient Education ? [place]. ? ? ? ?

## 2021-06-04 NOTE — Assessment & Plan Note (Addendum)
Under the care of Dr. Hartford Poli with [facility]. Last OV 07/2020. ?Current use of armour thyroid '60mg'$  &'15mg'$ . ?Hx of thyroid nodule. ? Last thyroid US 2021: very heterogenous thyroid without nodules. Isthmus 0.3cm, R. Lobe 1.86cm x 1.36cm x 4+cm, L. Lobe 1.94cm x 1.7cm x 4xcm. ?thyroid US 2013: enlarge thyroid without nodule, isthmus 0.4cm, R. Lobe 1.3cm x 1.6cm x 3.7cm, L. Lobe 1.6cm x 1.7cm x 4.1cm. ? ?She reports difficulty with weight loss despite strict diet and exercise regimen. She thinks this is related to thyroid function. This was discussed with Dr. Hartford Poli, but no medication modification was recommended per patient. She states she has switched endocrinologist 3 times and she is not able to not satisfied with armour thyroid management. ? ?Today we denies any fatigue, dysphagia, hoarseness, or sore throat or palpitations or skin changes or insomnia or change in bowel pattern or mood swings. ?Wt Readings from Last 3 Encounters:  ?06/04/21 192 lb 3.2 oz (87.2 kg)  ?04/17/21 189 lb (85.7 kg)  ?02/05/21 189 lb (85.7 kg)  ? ?BP Readings from Last 3 Encounters:  ?06/04/21 120/74  ?04/17/21 108/80  ?02/05/21 122/86  ? ?Advised to maintain appt with endocrinology or seek another opinion with [facility] ?

## 2021-06-04 NOTE — Progress Notes (Signed)
? ?Complete physical exam ? ?Patient: Emily Barker   DOB: 03/12/1975   46 y.o. Female  MRN: [id number] ?Visit Date: 06/04/2021 ? ?Subjective:  ?  ?Chief Complaint  ?Patient presents with  ? Annual Exam  ?  Physical-No breast or pap exam, pt has GYN.  ?Pt is fasting.  ?No other questions or concerns.   ? ?Leilany Ainsley is a 46 y.o. female who presents today for a complete physical exam. She reports consuming a low fat diet.  HIT exercise and weight training 4x/week, 30-50mins  She generally feels well. She reports sleeping well. She does have additional problems to discuss today.  ?Vision:Yes ?Dental:Yes ?STD Screen:No ? ?Most recent fall risk assessment: ? ?  06/04/2021  ?  8:34 AM  ?Fall Risk   ?Falls in the past year? 0  ?Number falls in past yr: 0  ?Injury with Fall? 0  ?Risk for fall due to : No Fall Risks  ?Follow up Falls evaluation completed  ? ?Most recent depression screenings: ? ?  06/04/2021  ?  9:03 AM 09/26/2020  ?  1:43 PM  ?PHQ 2/9 Scores  ?PHQ - 2 Score 0 0  ? ?HPI  ?Cavernous hemangioma of brain (HCC) ?Under the care of Dr. Lucia Gaskins ?Last brain MRI 2020: Abnormal MRI scan of the brain showing stable appearance of the left frontal 10 mm cavernous hemangioma.  No significant change compared with CT scan 02/25/2014 and MRI from 11/25 2013. ? ? ?Hashimoto's thyroiditis ?Under the care of Dr. Shawnee Knapp with [facility]. Last OV 07/2020. ?Current use of armour thyroid 60mg  &15mg . ?Hx of thyroid nodule. ? Last thyroid US 2021: very heterogenous thyroid without nodules. Isthmus 0.3cm, R. Lobe 1.86cm x 1.36cm x 4+cm, L. Lobe 1.94cm x 1.7cm x 4xcm. ?thyroid US 2013: enlarge thyroid without nodule, isthmus 0.4cm, R. Lobe 1.3cm x 1.6cm x 3.7cm, L. Lobe 1.6cm x 1.7cm x 4.1cm. ? ?She reports difficulty with weight loss despite strict diet and exercise regimen. She thinks this is related to thyroid function. This was discussed with Dr. Shawnee Knapp, but no medication modification was recommended per patient. She states she has  switched endocrinologist 3 times and she is not able to not satisfied with armour thyroid management. ? ?Today we denies any fatigue, dysphagia, hoarseness, or sore throat or palpitations or skin changes or insomnia or change in bowel pattern or mood swings. ?Wt Readings from Last 3 Encounters:  ?06/04/21 192 lb 3.2 oz (87.2 kg)  ?04/17/21 189 lb (85.7 kg)  ?02/05/21 189 lb (85.7 kg)  ? ?BP Readings from Last 3 Encounters:  ?06/04/21 120/74  ?04/17/21 108/80  ?02/05/21 122/86  ? ?Advised to maintain appt with endocrinology or seek another opinion with [facility] ? ?Past Medical History:  ?Diagnosis Date  ? Abnormal Pap smear of cervix 2007  ? CIN 2 + HRHPV/ LEEP  ? Allergy   ? Anemia   ? Anxiety 2012  ? Complication of anesthesia   ? bp dropped with epidural  ? Contraception   ? husband vasectomy  ? Diverticulosis   ? Dysmenorrhea   ? GERD (gastroesophageal reflux disease)   ? Hashimoto's thyroiditis   ? Heart murmur   ? as a child  ? Hemangioma, intracranial structures (HCC) 2013  ? Left Frontal Lobe  ? Hepatitis A   ? Hypertension   ? Hypothyroid   ? right thyroid nodule  ? Migraines   ? with aura  ? Panic disorder 2012  ? Sinus tachycardia 2012  ?  Vertigo   ? ?Past Surgical History:  ?Procedure Laterality Date  ? CERVICAL BIOPSY  W/ LOOP ELECTRODE EXCISION  2007  ? CIN 2  ? COLPOSCOPY  2007  ? CIN 2/ HRHPV  ? DILATION AND CURETTAGE OF UTERUS    ? SEPTOPLASTY    ? TONSILLECTOMY AND ADENOIDECTOMY  1988  ? ?Social History  ? ?Socioeconomic History  ? Marital status: Married  ?  Spouse name: Mellody Dance  ? Number of children: 3  ? Years of education: 42  ? Highest education level: Not on file  ?Occupational History  ? Occupation: [employer]  ?Tobacco Use  ? Smoking status: Never  ? Smokeless tobacco: Never  ?Vaping Use  ? Vaping Use: Never used  ?Substance and Sexual Activity  ? Alcohol use: Yes  ?  Alcohol/week: 10.0 - 14.0 standard drinks  ?  Types: 10 - 14 Glasses of wine per week  ?  Comment: 1-2 five oz  glasses of wine/day  ? Drug use: No  ? Sexual activity: Yes  ?  Partners: Male  ?  Birth control/protection: Other-see comments  ?  Comment: vasectomy-husband  ?Other Topics Concern  ? Not on file  ?Social History Narrative  ? Lives with husband, children  ? Caffeine use:  1 cup coffee/day  ? Originally from [place], grew up in [place]  ? ?Social Determinants of Health  ? ?Financial Resource Strain: Not on file  ?Food Insecurity: Not on file  ?Transportation Needs: Not on file  ?Physical Activity: Not on file  ?Stress: Not on file  ?Social Connections: Not on file  ?Intimate Partner Violence: Not on file  ? ?Family Status  ?Relation Name Status  ? Mother  Alive  ? Father ` Deceased  ?     passed January 22, 2016 ? Mat Aunt  (Not Specified)  ? Mat Aunt  (Not Specified)  ? Mat Uncle  (Not Specified)  ? Emelda Brothers  (Not Specified)  ? Oneal Grout  (Not Specified)  ? MGM  Deceased  ? MGF  Deceased  ? PGM  Alive  ? PGF  Deceased  ? Brother  (Not Specified)  ? Neg Hx  (Not Specified)  ? ?Family History  ?Problem Relation Age of Onset  ? Hyperlipidemia Mother   ? Prostate cancer Father 34  ?     Metastatic CA to bones  ? Hypertension Father   ? Alcohol abuse Father   ? Heart disease Father   ? Cancer Father   ?     Prostate  ? Breast cancer Maternal Aunt 42  ? Depression Maternal Aunt   ? Diabetes Maternal Aunt   ? Depression Maternal Aunt   ? Diabetes Maternal Aunt   ? Heart attack Maternal Uncle   ? Heart disease Paternal Aunt   ? Heart attack Paternal Uncle   ? Dementia Maternal Grandmother   ? Heart disease Maternal Grandfather   ? Heart disease Paternal Grandmother   ? Alcohol abuse Paternal Grandfather   ? Heart disease Paternal Grandfather   ? Hypertension Brother   ? Migraines Neg Hx   ? Colon cancer Neg Hx   ? Esophageal cancer Neg Hx   ? Rectal cancer Neg Hx   ? Stomach cancer Neg Hx   ? ?Allergies  ?Allergen Reactions  ? Gluten Meal Palpitations  ?  Other reaction(s): GI Symptoms ?Other reaction(s): GI Symptoms  ?  Miconazole Itching  ?  Severe itching and burning.  No rash or shortness of  breath. ?Severe itching and burning.  No rash or shortness of breath. ?Severe itching and burning.  No rash or shortness of breath. ?Severe itching and burning.  No rash or shortness of breath. ?Severe itching and burning.  No rash or shortness of breath. ?Severe itching and burning.  No rash or shortness of breath. ?Severe itching and burning.  No rash or shortness of breath. ?Severe itching and burning.  No rash or shortness of breath. ?Severe itching and burning.  No rash or shortness of breath.  ? Other Hives, Itching, Palpitations and Rash  ?  Hot flashes ?Hot flashes ?Hot flashes ?Hot flashes ?Hot flashes ?Hot flashes ?Hot flashes ?Hot flashes ?Hot flashes ?Hot flashes ?Hot flashes ?Hot flashes  ? Penicillin G Anaphylaxis, Rash, Nausea And Vomiting and Other (See Comments)  ? Latex Rash and Other (See Comments)  ?  itching ?itching ?itching ?itching ?itching  ? Iodinated Contrast Media Hives  ? Sulfa Antibiotics Other (See Comments), Hives, Palpitations and Itching  ?  Hot flashes, tachycardia ? ?Hot flashes ?Hot flashes ?Hot flashes ?Hot flashes  ?  ?Patient Care Team: ?Demari Gales, Bonna Gains, NP as PCP - General (Internal Medicine) ?Patton Salles, MD as Consulting Physician (Obstetrics and Gynecology) ?Betsey Amen, MD as Consulting Physician (Endocrinology)  ? ?Medications: ?Outpatient Medications Prior to Visit  ?Medication Sig  ? estradiol (VIVELLE-DOT) 0.05 MG/24HR patch Place 1 patch onto the skin 2 times a week.  ? fluticasone (FLONASE) 50 MCG/ACT nasal spray Place into both nostrils daily as needed.  ? loratadine (CLARITIN) 5 MG chewable tablet Chew 5 mg by mouth as needed.   ? losartan (COZAAR) 25 MG tablet TAKE 1 TABLET BY MOUTH ONCE A DAY  ? Multiple Vitamin (MULTIVITAMIN) tablet Take 1 tablet by mouth every other day.   ? progesterone (PROMETRIUM) 200 MG capsule Take 1 capsule by mouth daily at bedtime.  ?  Rimegepant Sulfate (NURTEC) 75 MG TBDP Take 75 mg by mouth daily as needed. For migraines. Take as close to onset of migraine as possible. One daily maximum.  ? thyroid (ARMOUR THYROID) 15 MG tablet Take one tablet (1

## 2021-06-04 NOTE — Assessment & Plan Note (Signed)
Under the care of Dr. Jaynee Eagles ?Last brain MRI 2020: Abnormal MRI scan of the brain showing stable appearance of the left frontal 10 mm cavernous hemangioma.  No significant change compared with CT scan 02/25/2014 and MRI from 11/25 2013. ? ?

## 2021-06-09 ENCOUNTER — Other Ambulatory Visit (HOSPITAL_COMMUNITY): Payer: Self-pay

## 2021-06-10 ENCOUNTER — Other Ambulatory Visit (HOSPITAL_COMMUNITY): Payer: Self-pay

## 2021-06-17 ENCOUNTER — Ambulatory Visit: Payer: 59 | Admitting: Family Medicine

## 2021-06-17 DIAGNOSIS — G43009 Migraine without aura, not intractable, without status migrainosus: Secondary | ICD-10-CM | POA: Diagnosis not present

## 2021-06-17 NOTE — Progress Notes (Signed)
Botox- 200 units x 1 vial Lot: [id number] Expiration: 12/2023 NDC: 0263-7858-85  Bacteriostatic 0.9% Sodium Chloride- 61m total Lot: [id number]: 08/27/2022 NDC: [address]-20 Dx: GN47.096B/B

## 2021-06-17 NOTE — Progress Notes (Signed)
06/17/21 ALL:  Emily Barker returns for Botox. We omited cervical and trapezius injections at last visit. She reports significant improvement in neck pain. Migraines remain well managed. She prefers frontalis injections approx 3/4 inch below hair line. No asymmetry noted but she is aware of risks. Migraines seem better with new office that has different lighting. Nurtec works well when needed.   03/25/2021 ALL:  Emily Barker returns for Botx. She continues to do well. She reports migraines are well managed for about 10 weeks following Botox procedure. Nurtec works for abortive therapy. She requests that we administer frontalis injections lower in the forehead and to omit cervical and trapezius injections. She states she had more pain in the neck after last procedure and feels it was due to Botox injections. She was educated on the possibility of forehead drooping with lowering frontalis injections and verbalizes understanding.   12/25/2020 ALL:  She returns for Botox. She continues Nurtec for abortive therapy. She is doing very well on Botox therapy.   From Dr Cathren Laine last note 09/09/2020 02/20/2020 AA: She is still doing great. But it wore off, wasn;t as effective higher up in the forehead but be careful for forehead drooping we did it 3/4 up the forehead instead. +temporals. NO masseters. Will try nurtec.She loved nurtec has only had to use one. +5Orb Oculi. +Temporalis. Also +5U occipital emergence and +10 bilat U cervical paraspinals, ask her to show where to place the extras in the cervical muscles she has a few spots in her lower cervical spine - ask. She goes to [employer] [facility]   No orders of the defined types were placed in this encounter.   08/08/2019 AA: She is still doing great. But it wore off, wasn;t as effective higher up in the forehead but be careful for forehead drooping we did it 3/4 up the forehead instead. +temporals. NO masseters.   Consent Form Botulism Toxin Injection For Chronic  Migraine   Reviewed orally with patient, additionally signature is on file:  Botulism toxin has been approved by the Federal drug administration for treatment of chronic migraine. Botulism toxin does not cure chronic migraine and it may not be effective in some patients.  The administration of botulism toxin is accomplished by injecting a small amount of toxin into the muscles of the neck and head. Dosage must be titrated for each individual. Any benefits resulting from botulism toxin tend to wear off after 3 months with a repeat injection required if benefit is to be maintained. Injections are usually done every 3-4 months with maximum effect peak achieved by about 2 or 3 weeks. Botulism toxin is expensive and you should be sure of what costs you will incur resulting from the injection.  The side effects of botulism toxin use for chronic migraine may include:   -Transient, and usually mild, facial weakness with facial injections  -Transient, and usually mild, head or neck weakness with head/neck injections  -Reduction or loss of forehead facial animation due to forehead muscle weakness  -Eyelid drooping  -Dry eye  -Pain at the site of injection or bruising at the site of injection  -Double vision  -Potential unknown long term risks   Contraindications: You should not have Botox if you are pregnant, nursing, allergic to albumin, have an infection, skin condition, or muscle weakness at the site of the injection, or have myasthenia gravis, Lambert-Eaton syndrome, or ALS.  It is also possible that as with any injection, there may be an allergic reaction or  no effect from the medication. Reduced effectiveness after repeated injections is sometimes seen and rarely infection at the injection site may occur. All care will be taken to prevent these side effects. If therapy is given over a long time, atrophy and wasting in the muscle injected may occur. Occasionally the patient's become refractory to  treatment because they develop antibodies to the toxin. In this event, therapy needs to be modified.  I have read the above information and consent to the administration of botulism toxin.    BOTOX PROCEDURE NOTE FOR MIGRAINE HEADACHE  Contraindications and precautions discussed with patient(above). Aseptic procedure was observed and patient tolerated procedure. Procedure performed by Debbora Presto, FNP-C.   The condition has existed for more than 6 months, and pt does not have a diagnosis of ALS, Myasthenia Gravis or Lambert-Eaton Syndrome.  Risks and benefits of injections discussed and pt agrees to proceed with the procedure.  Written consent obtained  These injections are medically necessary. Pt  receives good benefits from these injections. These injections do not cause sedations or hallucinations which the oral therapies may cause.   Description of procedure:  The patient was placed in a sitting position. The standard protocol was used for Botox as follows, with 5 units of Botox injected at each site:  -Procerus muscle, midline injection  -Corrugator muscle, bilateral injection  -Frontalis muscle, bilateral injection, with 2 sites each side, medial injection was performed in the upper half of the frontalis muscle, in the region vertical from the medial inferior edge of the superior orbital rim. The lateral injection was again in the upper one third of the forehead vertically above the lateral limbus of the cornea, 1.5 cm lateral to the medial injection site.  -Temporalis muscle injection, 4 sites, bilaterally. The first injection was 3 cm above the tragus of the ear, second injection site was 1.5 cm to 3 cm up from the first injection site in line with the tragus of the ear. The third injection site was 1.5-3 cm forward between the first 2 injection sites. The fourth injection site was 1.5 cm posterior to the second injection site. 5th site laterally in the temporalis  muscleat the level  of the outer canthus.  -Occipitalis muscle injection, 3 sites, bilaterally. The first injection was done one half way between the occipital protuberance and the tip of the mastoid process behind the ear. The second injection site was done lateral and superior to the first, 1 fingerbreadth from the first injection. The third injection site was 1 fingerbreadth superiorly and medially from the first injection site.     Will return for repeat injection in 3 months.   A total of 200 units of Botox was prepared, 105 units of Botox was injected as documented above, 95 units of Botox wasted. The patient tolerated the procedure well, there were no complications of the above procedure.

## 2021-07-04 ENCOUNTER — Other Ambulatory Visit: Payer: Self-pay | Admitting: Nurse Practitioner

## 2021-07-04 ENCOUNTER — Other Ambulatory Visit (HOSPITAL_COMMUNITY): Payer: Self-pay

## 2021-07-04 DIAGNOSIS — I10 Essential (primary) hypertension: Secondary | ICD-10-CM

## 2021-07-05 ENCOUNTER — Other Ambulatory Visit (HOSPITAL_COMMUNITY): Payer: Self-pay

## 2021-07-07 ENCOUNTER — Encounter: Payer: Self-pay | Admitting: Nurse Practitioner

## 2021-07-07 ENCOUNTER — Other Ambulatory Visit (HOSPITAL_COMMUNITY): Payer: Self-pay

## 2021-07-07 DIAGNOSIS — F40243 Fear of flying: Secondary | ICD-10-CM

## 2021-07-07 MED ORDER — ALPRAZOLAM 0.25 MG PO TABS
0.2500 mg | ORAL_TABLET | Freq: Two times a day (BID) | ORAL | 0 refills | Status: DC | PRN
  Filled 2021-07-07: qty 4, 2d supply, fill #0

## 2021-07-07 MED ORDER — LOSARTAN POTASSIUM 25 MG PO TABS
ORAL_TABLET | Freq: Every day | ORAL | 0 refills | Status: DC
  Filled 2021-07-07: qty 90, 90d supply, fill #0

## 2021-07-07 MED ORDER — ARMOUR THYROID 60 MG PO TABS
ORAL_TABLET | ORAL | 2 refills | Status: DC
  Filled 2021-07-07: qty 30, 30d supply, fill #0
  Filled 2021-08-08: qty 30, 30d supply, fill #1
  Filled 2021-09-09: qty 30, 30d supply, fill #2

## 2021-07-07 MED ORDER — ARMOUR THYROID 15 MG PO TABS
ORAL_TABLET | ORAL | 3 refills | Status: DC
  Filled 2021-07-07: qty 90, 90d supply, fill #0
  Filled 2021-11-21: qty 90, 90d supply, fill #1
  Filled 2022-02-26: qty 90, 90d supply, fill #2
  Filled 2022-05-29: qty 90, 90d supply, fill #3

## 2021-07-07 NOTE — Telephone Encounter (Signed)
Chart Supports Rx Last OV: 05/2021

## 2021-07-09 ENCOUNTER — Other Ambulatory Visit (HOSPITAL_COMMUNITY): Payer: Self-pay

## 2021-07-12 ENCOUNTER — Other Ambulatory Visit (HOSPITAL_COMMUNITY): Payer: Self-pay

## 2021-07-21 ENCOUNTER — Encounter (INDEPENDENT_AMBULATORY_CARE_PROVIDER_SITE_OTHER): Payer: Self-pay

## 2021-07-31 ENCOUNTER — Encounter: Payer: Self-pay | Admitting: Nurse Practitioner

## 2021-08-04 ENCOUNTER — Telehealth: Payer: Self-pay | Admitting: Nurse Practitioner

## 2021-08-07 ENCOUNTER — Ambulatory Visit: Payer: 59 | Admitting: Nurse Practitioner

## 2021-08-07 ENCOUNTER — Encounter: Payer: Self-pay | Admitting: Nurse Practitioner

## 2021-08-07 VITALS — BP 128/82 | HR 84 | Temp 97.7°F | Ht 63.0 in | Wt 192.6 lb

## 2021-08-07 DIAGNOSIS — S93411A Sprain of calcaneofibular ligament of right ankle, initial encounter: Secondary | ICD-10-CM

## 2021-08-07 NOTE — Patient Instructions (Signed)
Continue cold compress and elevation. Can also use an ACE wrap daily and off at night.  Managing Strains and Sprains This video lists treatment options for managing strains and sprains. You will learn about choices that will aid you and your health care provider in choosing the best treatment plan for you. To view the content, go to this web address: [email or web address]  This video will expire on: 04/15/2023. If you need access to this video following this date, please reach out to the healthcare provider who assigned it to you. This information is not intended to replace advice given to you by your health care provider. Make sure you discuss any questions you have with your health care provider. Elsevier Patient Education  [place].

## 2021-08-07 NOTE — Progress Notes (Signed)
Acute Office Visit  Subjective:    Patient ID: Emily Barker, female    DOB: [birth year], 46 y.o.   MRN: [id number]  Chief Complaint  Patient presents with   Acute Visit    C/o Rt ankle swelling x 1 month. No sharp pain or bruising No other concerns    HPI Patient is in today for right ankle swelling x 3weeks. Onset after trip to [place]. She did a lot of walking and standing during her trip. She wore tennis shoes. She does not remember any injury. Ankle swelling improves with elevation and cold compress. No redness, no pain with weight bearing activity, no paresthesia. She chooses to avoid oral NSAIDs due to hx of brain hemangioma.  Wt Readings from Last 3 Encounters:  08/07/21 192 lb 9.6 oz (87.4 kg)  06/04/21 192 lb 3.2 oz (87.2 kg)  04/17/21 189 lb (85.7 kg)    Outpatient Medications Prior to Visit  Medication Sig   estradiol (VIVELLE-DOT) 0.05 MG/24HR patch Place 1 patch onto the skin 2 times a week.   fluticasone (FLONASE) 50 MCG/ACT nasal spray Place into both nostrils daily as needed.   loratadine (CLARITIN) 5 MG chewable tablet Chew 5 mg by mouth as needed.    losartan (COZAAR) 25 MG tablet TAKE 1 TABLET BY MOUTH ONCE A DAY   Multiple Vitamin (MULTIVITAMIN) tablet Take 1 tablet by mouth every other day.    progesterone (PROMETRIUM) 200 MG capsule Take 1 capsule by mouth daily at bedtime.   Rimegepant Sulfate (NURTEC) 75 MG TBDP Take 75 mg by mouth daily as needed. For migraines. Take as close to onset of migraine as possible. One daily maximum.   thyroid (ARMOUR THYROID) 15 MG tablet Take one tablet (15 mg dose) by mouth daily.   thyroid (ARMOUR THYROID) 60 MG tablet Take 1 tablet by mouth daily (take with '15mg'$  tablet for a total of '75mg'$  total daily as directed)   [DISCONTINUED] ALPRAZolam (XANAX) 0.25 MG tablet Take 1 tablet (0.25 mg total) by mouth 2 (two) times daily as needed for anxiety (fear of flying). (Patient not taking: Reported on 08/07/2021)    [DISCONTINUED] triamcinolone ointment (KENALOG) 0.1 % Apply 1 application topically 2 (two) times daily. (Patient not taking: Reported on 08/07/2021)   Facility-Administered Medications Prior to Visit  Medication Dose Route Frequency Provider   0.9 %  sodium chloride infusion  500 mL Intravenous Once Nandigam, Venia Minks, MD   Reviewed past medical and social history.  Review of Systems Per HPI     Objective:    Physical Exam Musculoskeletal:     Right lower leg: No edema.     Left lower leg: No edema.     Right ankle: Swelling present. Tenderness present over the CF ligament. No lateral malleolus, medial malleolus, ATF ligament, AITF ligament, posterior TF ligament, base of 5th metatarsal or proximal fibula tenderness. Normal range of motion. Anterior drawer test negative.     Right Achilles Tendon: Normal.     Left ankle: Normal.     Left Achilles Tendon: Normal.     Right foot: Normal.     Left foot: Normal.  Skin:    General: Skin is warm and dry.     Findings: No erythema.  Neurological:     Mental Status: She is oriented to person, place, and time.    BP 128/82 (BP Location: Right Arm, Patient Position: Sitting, Cuff Size: Normal)   Pulse 84   Temp 97.7 F (36.5 C) (  Temporal)   Ht '5\' 3"'$  (1.6 m)   Wt 192 lb 9.6 oz (87.4 kg)   SpO2 97%   BMI 34.12 kg/m    No results found for any visits on 08/07/21.     Assessment & Plan:   Problem List Items Addressed This Visit   None Visit Diagnoses     Sprain of calcaneofibular ligament of right ankle, initial encounter    -  Primary     Low risk of DVT since swelling is localized and improves with elevation Advised to continue cold compress and elevation as needed Can also use an ACE wrap daily and off at night. Avoid high impact exercise.  No orders of the defined types were placed in this encounter.  Return if symptoms worsen or fail to improve.    Wilfred Lacy, NP

## 2021-08-08 NOTE — Telephone Encounter (Signed)
error 

## 2021-08-09 ENCOUNTER — Other Ambulatory Visit (HOSPITAL_COMMUNITY): Payer: Self-pay

## 2021-08-13 DIAGNOSIS — E063 Autoimmune thyroiditis: Secondary | ICD-10-CM | POA: Diagnosis not present

## 2021-08-18 ENCOUNTER — Ambulatory Visit: Payer: 59 | Admitting: Nurse Practitioner

## 2021-09-03 ENCOUNTER — Encounter (INDEPENDENT_AMBULATORY_CARE_PROVIDER_SITE_OTHER): Payer: Self-pay

## 2021-09-10 ENCOUNTER — Other Ambulatory Visit (HOSPITAL_COMMUNITY): Payer: Self-pay

## 2021-09-10 ENCOUNTER — Encounter: Payer: Self-pay | Admitting: Family Medicine

## 2021-09-10 ENCOUNTER — Ambulatory Visit: Payer: 59 | Admitting: Family Medicine

## 2021-09-10 DIAGNOSIS — D1802 Hemangioma of intracranial structures: Secondary | ICD-10-CM

## 2021-09-10 DIAGNOSIS — G8929 Other chronic pain: Secondary | ICD-10-CM

## 2021-09-10 DIAGNOSIS — G43709 Chronic migraine without aura, not intractable, without status migrainosus: Secondary | ICD-10-CM

## 2021-09-10 MED ORDER — ONABOTULINUMTOXINA 200 UNITS IJ SOLR
155.0000 [IU] | Freq: Once | INTRAMUSCULAR | Status: AC
  Administered 2021-09-10: 105 [IU] via INTRAMUSCULAR

## 2021-09-10 NOTE — Progress Notes (Signed)
09/10/21 ALL:  Emily Barker returns for Botox. She continues to do well. She may have 2-3 migraines per month. Nurtec works well. She prefers to omit cervical and trapezius injections. She is requesting MRI for monitoring of cavernous hemangioma. MRI 2020 stable. NO new symptoms.   06/17/2021 ALL: Emily Barker returns for Botox. We omited cervical and trapezius injections at last visit. She reports significant improvement in neck pain. Migraines remain well managed. She prefers frontalis injections approx 3/4 inch below hair line. No asymmetry noted but she is aware of risks. Migraines seem better with new office that has different lighting. Nurtec works well when needed.   03/25/2021 ALL:  Emily Barker returns for Botx. She continues to do well. She reports migraines are well managed for about 10 weeks following Botox procedure. Nurtec works for abortive therapy. She requests that we administer frontalis injections lower in the forehead and to omit cervical and trapezius injections. She states she had more pain in the neck after last procedure and feels it was due to Botox injections. She was educated on the possibility of forehead drooping with lowering frontalis injections and verbalizes understanding.   12/25/2020 ALL:  She returns for Botox. She continues Nurtec for abortive therapy. She is doing very well on Botox therapy.   From Dr Cathren Laine last note 09/09/2020 02/20/2020 AA: She is still doing great. But it wore off, wasn;t as effective higher up in the forehead but be careful for forehead drooping we did it 3/4 up the forehead instead. +temporals. NO masseters. Will try nurtec.She loved nurtec has only had to use one. +5Orb Oculi. +Temporalis. Also +5U occipital emergence and +10 bilat U cervical paraspinals, ask her to show where to place the extras in the cervical muscles she has a few spots in her lower cervical spine - ask. She goes to [employer] [facility]   No orders of the defined types were placed  in this encounter.   08/08/2019 AA: She is still doing great. But it wore off, wasn;t as effective higher up in the forehead but be careful for forehead drooping we did it 3/4 up the forehead instead. +temporals. NO masseters.   Consent Form Botulism Toxin Injection For Chronic Migraine   Reviewed orally with patient, additionally signature is on file:  Botulism toxin has been approved by the Federal drug administration for treatment of chronic migraine. Botulism toxin does not cure chronic migraine and it may not be effective in some patients.  The administration of botulism toxin is accomplished by injecting a small amount of toxin into the muscles of the neck and head. Dosage must be titrated for each individual. Any benefits resulting from botulism toxin tend to wear off after 3 months with a repeat injection required if benefit is to be maintained. Injections are usually done every 3-4 months with maximum effect peak achieved by about 2 or 3 weeks. Botulism toxin is expensive and you should be sure of what costs you will incur resulting from the injection.  The side effects of botulism toxin use for chronic migraine may include:   -Transient, and usually mild, facial weakness with facial injections  -Transient, and usually mild, head or neck weakness with head/neck injections  -Reduction or loss of forehead facial animation due to forehead muscle weakness  -Eyelid drooping  -Dry eye  -Pain at the site of injection or bruising at the site of injection  -Double vision  -Potential unknown long term risks   Contraindications: You should not have Botox if  you are pregnant, nursing, allergic to albumin, have an infection, skin condition, or muscle weakness at the site of the injection, or have myasthenia gravis, Lambert-Eaton syndrome, or ALS.  It is also possible that as with any injection, there may be an allergic reaction or no effect from the medication. Reduced effectiveness after  repeated injections is sometimes seen and rarely infection at the injection site may occur. All care will be taken to prevent these side effects. If therapy is given over a long time, atrophy and wasting in the muscle injected may occur. Occasionally the patient's become refractory to treatment because they develop antibodies to the toxin. In this event, therapy needs to be modified.  I have read the above information and consent to the administration of botulism toxin.    BOTOX PROCEDURE NOTE FOR MIGRAINE HEADACHE  Contraindications and precautions discussed with patient(above). Aseptic procedure was observed and patient tolerated procedure. Procedure performed by Debbora Presto, FNP-C.   The condition has existed for more than 6 months, and pt does not have a diagnosis of ALS, Myasthenia Gravis or Lambert-Eaton Syndrome.  Risks and benefits of injections discussed and pt agrees to proceed with the procedure.  Written consent obtained  These injections are medically necessary. Pt  receives good benefits from these injections. These injections do not cause sedations or hallucinations which the oral therapies may cause.   Description of procedure:  The patient was placed in a sitting position. The standard protocol was used for Botox as follows, with 5 units of Botox injected at each site:  -Procerus muscle, midline injection  -Corrugator muscle, bilateral injection  -Frontalis muscle, bilateral injection, with 2 sites each side, medial injection was performed in the upper half of the frontalis muscle, in the region vertical from the medial inferior edge of the superior orbital rim. The lateral injection was again in the upper one third of the forehead vertically above the lateral limbus of the cornea, 1.5 cm lateral to the medial injection site.  -Temporalis muscle injection, 4 sites, bilaterally. The first injection was 3 cm above the tragus of the ear, second injection site was 1.5 cm to 3 cm  up from the first injection site in line with the tragus of the ear. The third injection site was 1.5-3 cm forward between the first 2 injection sites. The fourth injection site was 1.5 cm posterior to the second injection site. 5th site laterally in the temporalis  muscleat the level of the outer canthus.  -Occipitalis muscle injection, 3 sites, bilaterally. The first injection was done one half way between the occipital protuberance and the tip of the mastoid process behind the ear. The second injection site was done lateral and superior to the first, 1 fingerbreadth from the first injection. The third injection site was 1 fingerbreadth superiorly and medially from the first injection site.   Will return for repeat injection in 3 months.   A total of 200 units of Botox was prepared, 105 units of Botox was injected as documented above, 95 units of Botox wasted. The patient tolerated the procedure well, there were no complications of the above procedure.

## 2021-09-10 NOTE — Progress Notes (Signed)
Botox- 200 units x 1 vial Lot: [id number] Expiration: 02/2024 NDC: 2080-2233-61  Bacteriostatic 0.9% Sodium Chloride- 71m total Lot: [id number]: 09/27/2022 NDC: [address]-21 Dx: [id number]

## 2021-09-10 NOTE — Patient Instructions (Signed)
Hey there, Emily Barker! I looked for the last MRI and it was stable in 2020. Previous imaging was 2013. I have ordered an MRI for monitoring. I think we can get it covered but since we are doing well and no new/worrisome symptoms, they may deny request. I will let you know if we run into any hiccups! Have a great week!

## 2021-09-17 ENCOUNTER — Other Ambulatory Visit (HOSPITAL_COMMUNITY): Payer: Self-pay

## 2021-09-18 ENCOUNTER — Telehealth: Payer: Self-pay | Admitting: Family Medicine

## 2021-09-18 NOTE — Telephone Encounter (Signed)
UMR NPR sent to GI

## 2021-09-22 ENCOUNTER — Other Ambulatory Visit (HOSPITAL_COMMUNITY): Payer: Self-pay

## 2021-10-01 ENCOUNTER — Other Ambulatory Visit: Payer: 59

## 2021-10-01 ENCOUNTER — Other Ambulatory Visit: Payer: Self-pay | Admitting: Nurse Practitioner

## 2021-10-01 DIAGNOSIS — I10 Essential (primary) hypertension: Secondary | ICD-10-CM

## 2021-10-01 MED ORDER — LOSARTAN POTASSIUM 25 MG PO TABS
25.0000 mg | ORAL_TABLET | Freq: Every day | ORAL | 0 refills | Status: DC
  Filled 2021-10-01: qty 90, 90d supply, fill #0

## 2021-10-01 NOTE — Telephone Encounter (Signed)
Chart supports Rx Last OV: 07/2021 Next OV:  not scheduled

## 2021-10-02 ENCOUNTER — Other Ambulatory Visit (HOSPITAL_COMMUNITY): Payer: Self-pay

## 2021-10-07 ENCOUNTER — Other Ambulatory Visit (HOSPITAL_COMMUNITY): Payer: Self-pay

## 2021-10-08 ENCOUNTER — Other Ambulatory Visit (HOSPITAL_COMMUNITY): Payer: Self-pay

## 2021-10-08 MED ORDER — THYROID 60 MG PO TABS
ORAL_TABLET | ORAL | 2 refills | Status: DC
  Filled 2021-10-08: qty 30, 30d supply, fill #0
  Filled 2021-11-07: qty 30, 30d supply, fill #1
  Filled 2021-12-08: qty 30, 30d supply, fill #2

## 2021-10-09 ENCOUNTER — Ambulatory Visit
Admission: RE | Admit: 2021-10-09 | Discharge: 2021-10-09 | Disposition: A | Discharge status: Home / Self Care | Payer: 59 | Source: Ambulatory Visit | Attending: Family Medicine | Admitting: Family Medicine

## 2021-10-09 ENCOUNTER — Other Ambulatory Visit (HOSPITAL_COMMUNITY): Payer: Self-pay

## 2021-10-09 DIAGNOSIS — G8929 Other chronic pain: Secondary | ICD-10-CM

## 2021-10-09 DIAGNOSIS — D1802 Hemangioma of intracranial structures: Secondary | ICD-10-CM

## 2021-10-09 DIAGNOSIS — R519 Headache, unspecified: Secondary | ICD-10-CM | POA: Diagnosis not present

## 2021-10-09 MED ORDER — GADOBENATE DIMEGLUMINE 529 MG/ML IV SOLN
15.0000 mL | Freq: Once | INTRAVENOUS | Status: AC | PRN
Start: 2021-10-09 — End: 2021-10-09
  Administered 2021-10-09: 15 mL via INTRAVENOUS

## 2021-10-11 ENCOUNTER — Other Ambulatory Visit (HOSPITAL_COMMUNITY): Payer: Self-pay

## 2021-11-04 ENCOUNTER — Telehealth: Payer: Self-pay | Admitting: Family Medicine

## 2021-11-04 DIAGNOSIS — Z0289 Encounter for other administrative examinations: Secondary | ICD-10-CM

## 2021-11-04 NOTE — Telephone Encounter (Signed)
Informed patient she owes 50.00 to process a form and to call the office and ask for Hilda Blades.

## 2021-11-04 NOTE — Telephone Encounter (Signed)
Advised patient to stop in by 5pm to pay

## 2021-11-06 NOTE — Telephone Encounter (Signed)
Form has been completed and turned into medical records for the patient

## 2021-11-07 ENCOUNTER — Other Ambulatory Visit (HOSPITAL_COMMUNITY): Payer: Self-pay

## 2021-11-13 IMAGING — DX DG FOOT COMPLETE 3+V*R*
3 series · 3 of 3 positions shown · non-contrast
Comparison: None.

CLINICAL DATA: Trauma to the fifth toe.

EXAM:
RIGHT FOOT COMPLETE - 3+ VIEW

[foot ap]
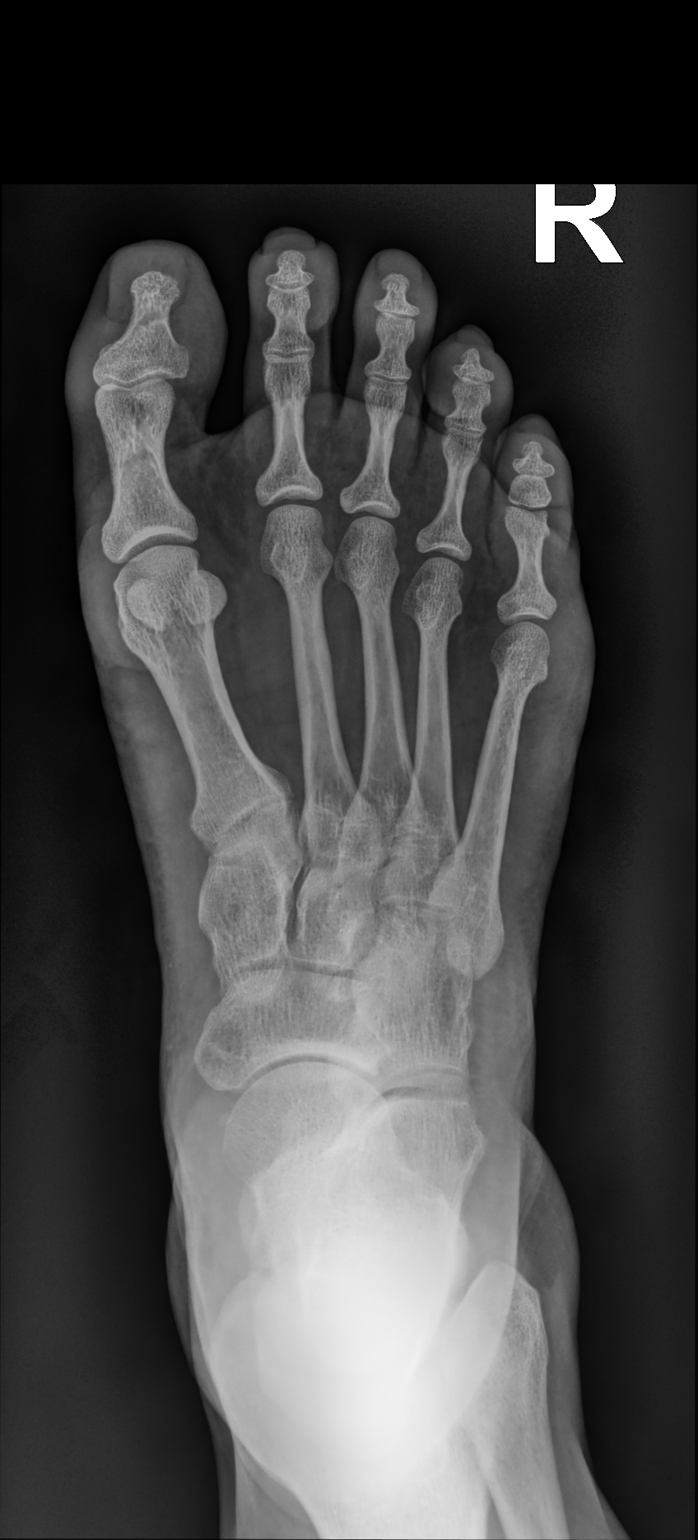

[foot mlo]
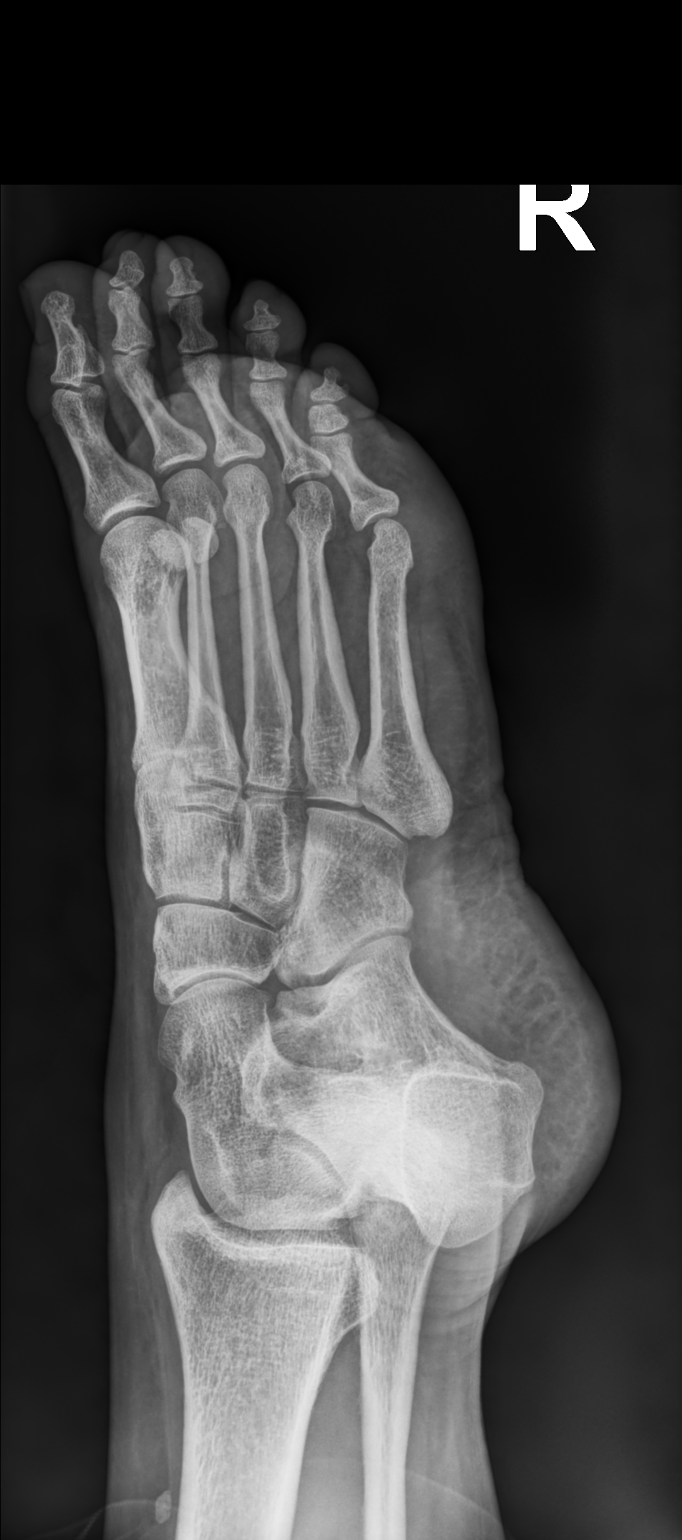

[foot lat]
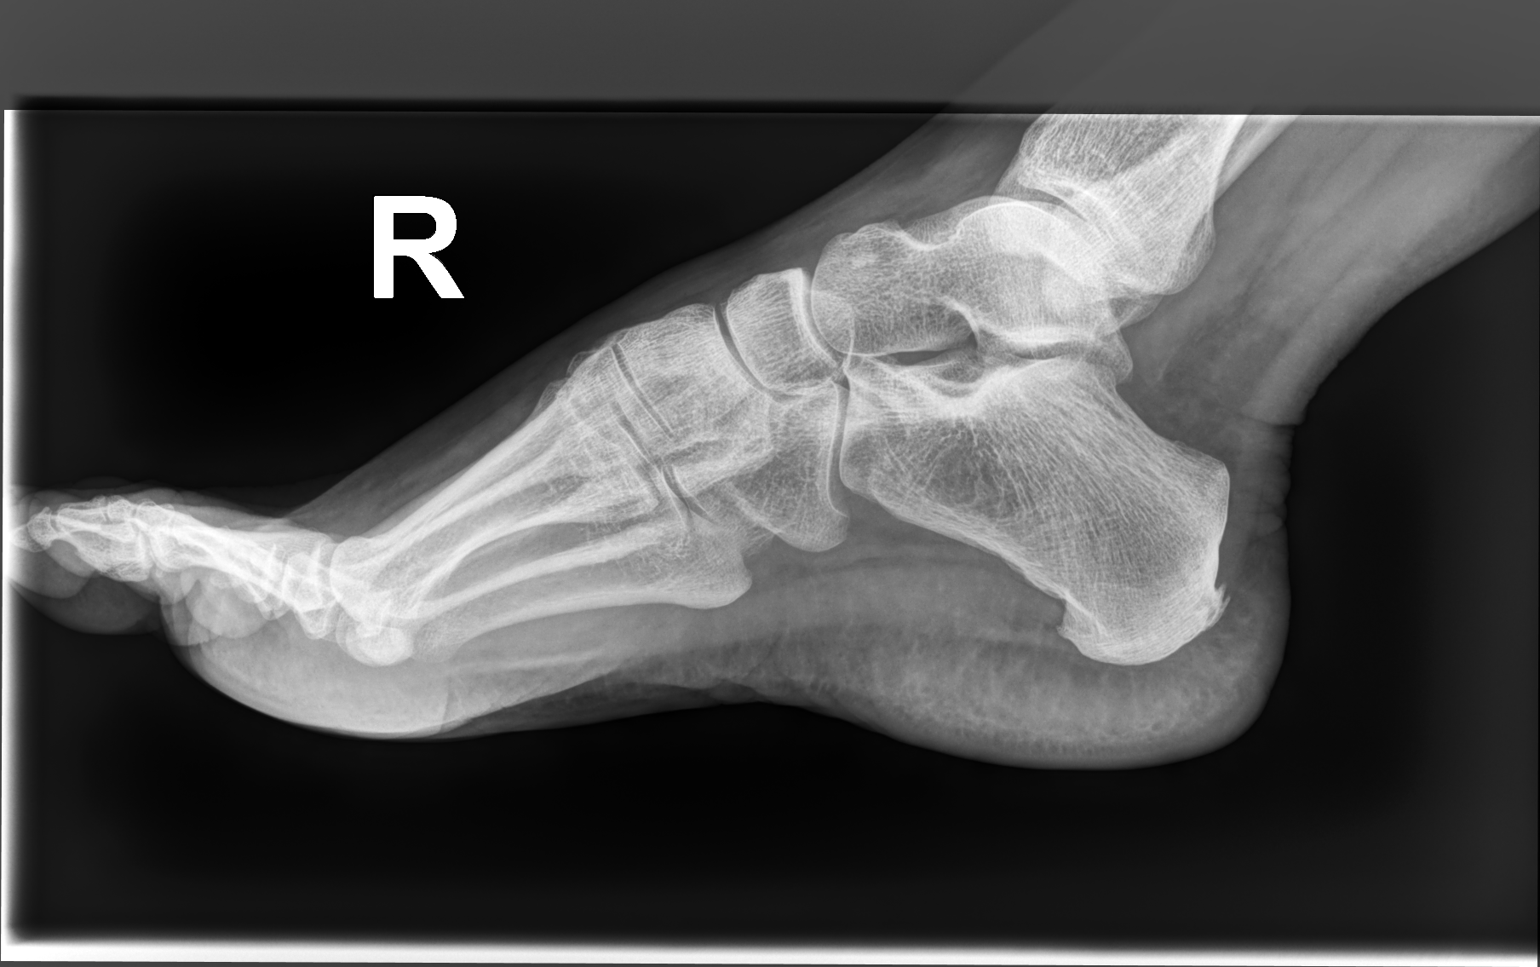

[3 of 3 positions shown; findings below may reference images not displayed]

FINDINGS: There is no evidence of fracture or dislocation. There is no
evidence of arthropathy or other focal bone abnormality. Soft
tissues are unremarkable.
IMPRESSION: Negative.

## 2021-11-20 ENCOUNTER — Ambulatory Visit: Payer: 59 | Admitting: Obstetrics and Gynecology

## 2021-11-21 ENCOUNTER — Other Ambulatory Visit (HOSPITAL_COMMUNITY): Payer: Self-pay

## 2021-12-08 ENCOUNTER — Other Ambulatory Visit (HOSPITAL_COMMUNITY): Payer: Self-pay

## 2021-12-08 ENCOUNTER — Ambulatory Visit: Payer: 59 | Admitting: Family Medicine

## 2021-12-09 ENCOUNTER — Encounter: Payer: Self-pay | Admitting: Neurology

## 2021-12-09 ENCOUNTER — Other Ambulatory Visit (HOSPITAL_COMMUNITY): Payer: Self-pay

## 2021-12-09 ENCOUNTER — Encounter: Payer: Self-pay | Admitting: Obstetrics and Gynecology

## 2021-12-09 ENCOUNTER — Other Ambulatory Visit (HOSPITAL_COMMUNITY)
Admission: RE | Admit: 2021-12-09 | Discharge: 2021-12-09 | Disposition: A | Discharge status: Home / Self Care | Payer: 59 | Source: Ambulatory Visit | Attending: Obstetrics and Gynecology | Admitting: Obstetrics and Gynecology

## 2021-12-09 ENCOUNTER — Ambulatory Visit: Payer: 59 | Admitting: Neurology

## 2021-12-09 ENCOUNTER — Ambulatory Visit (INDEPENDENT_AMBULATORY_CARE_PROVIDER_SITE_OTHER): Payer: 59 | Admitting: Obstetrics and Gynecology

## 2021-12-09 VITALS — BP 132/80 | HR 82 | Ht 64.0 in | Wt 186.0 lb

## 2021-12-09 DIAGNOSIS — Z01419 Encounter for gynecological examination (general) (routine) without abnormal findings: Secondary | ICD-10-CM

## 2021-12-09 DIAGNOSIS — G43709 Chronic migraine without aura, not intractable, without status migrainosus: Secondary | ICD-10-CM | POA: Diagnosis not present

## 2021-12-09 DIAGNOSIS — N870 Mild cervical dysplasia: Secondary | ICD-10-CM

## 2021-12-09 MED ORDER — ESTRADIOL 0.05 MG/24HR TD PTTW
1.0000 | MEDICATED_PATCH | TRANSDERMAL | 3 refills | Status: AC
  Filled 2021-12-09 – 2021-12-31 (×2): qty 24, 84d supply, fill #0
  Filled 2022-03-16: qty 24, 84d supply, fill #1
  Filled 2022-06-14: qty 24, 84d supply, fill #2
  Filled 2022-07-22 – 2022-07-23 (×2): qty 24, 84d supply, fill #3

## 2021-12-09 MED ORDER — ONABOTULINUMTOXINA 100 UNITS IJ SOLR
155.0000 [IU] | Freq: Once | INTRAMUSCULAR | Status: AC
  Administered 2021-12-09: 155 [IU] via INTRAMUSCULAR

## 2021-12-09 MED ORDER — PROGESTERONE 200 MG PO CAPS
200.0000 mg | ORAL_CAPSULE | Freq: Every day | ORAL | 3 refills | Status: AC
  Filled 2021-12-09 – 2022-02-01 (×2): qty 90, 90d supply, fill #0
  Filled 2022-02-04 – 2022-04-28 (×2): qty 90, 90d supply, fill #1
  Filled 2022-07-22: qty 90, 90d supply, fill #2
  Filled 2022-10-25: qty 90, 90d supply, fill #3

## 2021-12-09 NOTE — Progress Notes (Signed)
Consent Form Botulism Toxin Injection For Chronic Migraine   12/09/2021: She is still doing great, > 60% decrease in migraine freq and severity since botox.   Meds ordered this encounter  Medications   botulinum toxin Type A (BOTOX) injection 155 Units    Botox- 100 units x 2 vials Lot: [id number] Expiration: 03/2024 NDC: 2637-8588-50  Bacteriostatic 0.9% Sodium Chloride- 43m total Lot: [id number]: 09/27/2022 NDC: [address]-09 Dx: [id number]/[id number]     Reviewed orally with patient, additionally signature is on file:  Botulism toxin has been approved by the Federal drug administration for treatment of chronic migraine. Botulism toxin does not cure chronic migraine and it may not be effective in some patients.  The administration of botulism toxin is accomplished by injecting a small amount of toxin into the muscles of the neck and head. Dosage must be titrated for each individual. Any benefits resulting from botulism toxin tend to wear off after 3 months with a repeat injection required if benefit is to be maintained. Injections are usually done every 3-4 months with maximum effect peak achieved by about 2 or 3 weeks. Botulism toxin is expensive and you should be sure of what costs you will incur resulting from the injection.  The side effects of botulism toxin use for chronic migraine may include:   -Transient, and usually mild, facial weakness with facial injections  -Transient, and usually mild, head or neck weakness with head/neck injections  -Reduction or loss of forehead facial animation due to forehead muscle weakness  -Eyelid drooping  -Dry eye  -Pain at the site of injection or bruising at the site of injection  -Double vision  -Potential unknown long term risks  Contraindications: You should not have Botox if you are pregnant, nursing, allergic to albumin, have an infection, skin condition, or muscle weakness at the site of the injection, or have myasthenia  gravis, Lambert-Eaton syndrome, or ALS.  It is also possible that as with any injection, there may be an allergic reaction or no effect from the medication. Reduced effectiveness after repeated injections is sometimes seen and rarely infection at the injection site may occur. All care will be taken to prevent these side effects. If therapy is given over a long time, atrophy and wasting in the muscle injected may occur. Occasionally the patient's become refractory to treatment because they develop antibodies to the toxin. In this event, therapy needs to be modified.  I have read the above information and consent to the administration of botulism toxin.    BOTOX PROCEDURE NOTE FOR MIGRAINE HEADACHE    Contraindications and precautions discussed with patient(above). Aseptic procedure was observed and patient tolerated procedure. Procedure performed by Dr. TGeorgia Dom The condition has existed for more than 6 months, and pt does not have a diagnosis of ALS, Myasthenia Gravis or Lambert-Eaton Syndrome.  Risks and benefits of injections discussed and pt agrees to proceed with the procedure.  Written consent obtained  These injections are medically necessary. Pt  receives good benefits from these injections. These injections do not cause sedations or hallucinations which the oral therapies may cause.  Description of procedure:  The patient was placed in a sitting position. The standard protocol was used for Botox as follows, with 5 units of Botox injected at each site:   -Procerus muscle, midline injection  -Corrugator muscle, bilateral injection  -Frontalis muscle, bilateral injection, with 2 sites each side, medial injection was performed in the upper one third of the frontalis  muscle, in the region vertical from the medial inferior edge of the superior orbital rim. The lateral injection was again in the upper one third of the forehead vertically above the lateral limbus of the cornea, 1.5 cm  lateral to the medial injection site.   -Temporalis muscle injection, 4 sites, bilaterally. The first injection was 3 cm above the tragus of the ear, second injection site was 1.5 cm to 3 cm up from the first injection site in line with the tragus of the ear. The third injection site was 1.5-3 cm forward between the first 2 injection sites. The fourth injection site was 1.5 cm posterior to the second injection site.    -Occipitalis muscle injection, 3 sites, bilaterally. The first injection was done one half way between the occipital protuberance and the tip of the mastoid process behind the ear. The second injection site was done lateral and superior to the first, 1 fingerbreadth from the first injection. The third injection site was 1 of the last medication how things tell me about what what you mean you are moving to [place] current for July an office okay to start hurting you you exciting how much I would like to get out of the [place] is not on the water is on the water yes please see which is okay fingerbreadth superiorly and medially from the first injection site.  -Cervical paraspinal muscle injection, 2 sites, bilateral knee first injection site was 1 cm from the midline of the cervical spine, 3 cm inferior to the lower border of the occipital protuberance. The second injection site was 1.5 cm superiorly and laterally to the first injection site.  -Trapezius muscle injection was performed at 3 sites, bilaterally. The first injection site was in the upper trapezius muscle halfway between the inflection point of the neck, and the acromion. The second injection site was one half way between the acromion and the first injection site. The third injection was done between the first injection site and the inflection point of the neck.   Will return for repeat injection in 3 months.   A 200 unit sof Botox was used, any Botox not injected was wasted. The patient tolerated  the procedure well, there were no complications of the above procedure.

## 2021-12-09 NOTE — Patient Instructions (Signed)

## 2021-12-09 NOTE — Progress Notes (Signed)
Botox- 100 units x 2 vials Lot: [id number] Expiration: 03/2024 NDC: 0023-1145-01  Bacteriostatic 0.9% Sodium Chloride- 4mL total Lot: [id number] Expiration: 09/27/2022 NDC: 0409-1966-02  Dx: [id number] B/B   

## 2021-12-09 NOTE — Progress Notes (Addendum)
46 y.o. [id number] Married Caucasian female here for annual exam.    Taking HRT for perimenopausal symptoms.  She is doing continuous regimen.   Having periods that last 4 days and occur every month.  Light flow and using panty liners. No bleeding in between cycles.  Mild hot flashes during night and not in the day.  No brain fog or muscle fatigue.   Bladder control is doing ok.   Likely moving to [place] next summer.   PCP:   Wilfred Lacy, NP  Patient's last menstrual period was 11/27/2021.     Period Cycle (Days): 28 Period Duration (Days): 5 Period Pattern: Regular Menstrual Flow: Moderate Menstrual Control: Maxi pad, Tampon Dysmenorrhea: (!) Mild Dysmenorrhea Symptoms: Cramping     Sexually active: Yes.    The current method of family planning is vasectomy.    Exercising: Yes.    Gym/ health club routine includes mod to heavy weightlifting. Smoker:  no  Health Maintenance: Pap:  11-14-20 AGUS;Neg HR HPV, 07-12-17 Neg:Neg HR HPV History of abnormal Pap:   Yes.  AGUS pap on 07/14/20.  Colpo:  negative ECC, bx CIN I, benign EMB. Hx LEEP - CIN II. MMG:  04-02-21 Neg/Birads1  Colonoscopy:  01/21/18.  Normal.  Due in 10 years.  BMD:   n/a TDaP:  11/14/20 Gardasil:   no HIV:   neg in pregnancy.  Hep C:  ? Screening Labs:  PCP Flu vaccine:  completed.  Covid vaccine:  discussed   reports that she has never smoked. She has never used smokeless tobacco. She reports current alcohol use of about 10.0 - 14.0 standard drinks of alcohol per week. She reports that she does not use drugs.  Past Medical History:  Diagnosis Date   Abnormal Pap smear of cervix 2007   CIN 2 + HRHPV/ LEEP   Allergy    Anemia    Anxiety 5573   Complication of anesthesia    bp dropped with epidural   Contraception    husband vasectomy   Diverticulosis    Dysmenorrhea    GERD (gastroesophageal reflux disease)    Hashimoto's thyroiditis    Heart murmur    as a child   Hemangioma, intracranial  structures ([place]) 2013   Left Frontal Lobe   Hepatitis A    Hypertension    Hypothyroid    right thyroid nodule   Migraines    with aura   Panic disorder 2012   Sinus tachycardia 2012   Vertigo     Past Surgical History:  Procedure Laterality Date   CERVICAL BIOPSY  W/ LOOP ELECTRODE EXCISION  2007   CIN 2   COLPOSCOPY  2007   CIN 2/ HRHPV   DILATION AND CURETTAGE OF UTERUS     SEPTOPLASTY     TONSILLECTOMY AND ADENOIDECTOMY  1988    Current Outpatient Medications  Medication Sig Dispense Refill   estradiol (VIVELLE-DOT) 0.05 MG/24HR patch Place 1 patch onto the skin 2 times a week. 24 patch 2   fluticasone (FLONASE) 50 MCG/ACT nasal spray Place into both nostrils daily as needed.     loratadine (CLARITIN) 5 MG chewable tablet Chew 5 mg by mouth as needed.      losartan (COZAAR) 25 MG tablet Take 1 tablet (25 mg total) by mouth daily. 90 tablet 0   Multiple Vitamin (MULTIVITAMIN) tablet Take 1 tablet by mouth every other day.      progesterone (PROMETRIUM) 200 MG capsule Take 1 capsule by mouth daily  at bedtime. 90 capsule 2   Rimegepant Sulfate (NURTEC) 75 MG TBDP Take 75 mg by mouth daily as needed. For migraines. Take as close to onset of migraine as possible. One daily maximum. 4 tablet 0   thyroid (ARMOUR THYROID) 15 MG tablet Take one tablet (15 mg dose) by mouth daily. 90 tablet 3   thyroid (ARMOUR THYROID) 60 MG tablet Take 1 tablet by mouth daily (take with '15mg'$  tablet for a total of '75mg'$  total daily as directed) 30 tablet 2   Current Facility-Administered Medications  Medication Dose Route Frequency Provider Last Rate Last Admin   0.9 %  sodium chloride infusion  500 mL Intravenous Once Nandigam, Kavitha V, MD       botulinum toxin Type A (BOTOX) injection 155 Units  155 Units Intramuscular Once Melvenia Beam, MD        Family History  Problem Relation Age of Onset   Hyperlipidemia Mother    Prostate cancer Father 70       Metastatic CA to bones    Hypertension Father    Alcohol abuse Father    Heart disease Father    Cancer Father        Prostate   Breast cancer Maternal Aunt 42   Depression Maternal Aunt    Diabetes Maternal Aunt    Depression Maternal Aunt    Diabetes Maternal Aunt    Heart attack Maternal Uncle    Heart disease Paternal Aunt    Heart attack Paternal Uncle    Dementia Maternal Grandmother    Heart disease Maternal Grandfather    Heart disease Paternal Grandmother    Alcohol abuse Paternal Grandfather    Heart disease Paternal Grandfather    Hypertension Brother    Migraines Neg Hx    Colon cancer Neg Hx    Esophageal cancer Neg Hx    Rectal cancer Neg Hx    Stomach cancer Neg Hx     Review of Systems  All other systems reviewed and are negative.   Exam:   BP 132/80 (BP Location: Left Arm, Patient Position: Sitting, Cuff Size: Normal)   Pulse 82   Ht '5\' 4"'$  (1.626 m)   Wt 186 lb (84.4 kg)   LMP 11/27/2021   BMI 31.93 kg/m     General appearance: alert, cooperative and appears stated age Head: normocephalic, without obvious abnormality, atraumatic Neck: no adenopathy, supple, symmetrical, trachea midline and thyroid normal to inspection and palpation Lungs: clear to auscultation bilaterally Breasts: normal appearance, no masses or tenderness, No nipple retraction or dimpling, No nipple discharge or bleeding, No axillary adenopathy Heart: regular rate and rhythm Abdomen: soft, non-tender; no masses, no organomegaly Extremities: extremities normal, atraumatic, no cyanosis or edema Skin: skin color, texture, turgor normal. No rashes or lesions Lymph nodes: cervical, supraclavicular, and axillary nodes normal. Neurologic: grossly normal  Pelvic: External genitalia:  no lesions              No abnormal inguinal nodes palpated.              Urethra:  normal appearing urethra with no masses, tenderness or lesions              Bartholins and Skenes: normal                 Vagina: normal appearing  vagina with normal color and discharge, no lesions              Cervix: no lesions  Pap taken: yes Bimanual Exam:  Uterus:  normal size, contour, position, consistency, mobility, non-tender              Adnexa: no mass, fullness, tenderness              Rectal exam: yes.  Confirms.              Anus:  normal sphincter tone, no lesions  Chaperone was present for exam:  Kimalexis  Assessment:   Well woman visit with gynecologic exam. Hx AGUS pap an LGSIL. Hx LEEP, 2007.  Stress incontinence.  Urinary urgency.  Migraine HA with aura. HTN. Intracranial hemangioma. HRT for perimenopausal symptoms.   Plan: Mammogram screening discussed. Self breast awareness reviewed. Pap and HR HPV collected today. Guidelines for Calcium, Vitamin D, regular exercise program including cardiovascular and weight bearing exercise. Discused WHI and use of HRT which can increase risk of PE, DVT, MI, stroke and breast cancer.  Refill of Vivelle Dot and Prometrium for one year. Follow up annually and prn.   After visit summary provided.

## 2021-12-11 LAB — CYTOLOGY - PAP
Comment: NEGATIVE
Diagnosis: NEGATIVE
Diagnosis: REACTIVE
High risk HPV: NEGATIVE

## 2021-12-31 ENCOUNTER — Other Ambulatory Visit (HOSPITAL_COMMUNITY): Payer: Self-pay

## 2021-12-31 ENCOUNTER — Other Ambulatory Visit: Payer: Self-pay | Admitting: Nurse Practitioner

## 2021-12-31 DIAGNOSIS — I10 Essential (primary) hypertension: Secondary | ICD-10-CM

## 2021-12-31 MED ORDER — LOSARTAN POTASSIUM 25 MG PO TABS
25.0000 mg | ORAL_TABLET | Freq: Every day | ORAL | 0 refills | Status: DC
  Filled 2021-12-31: qty 30, 30d supply, fill #0

## 2021-12-31 MED ORDER — THYROID 60 MG PO TABS
60.0000 mg | ORAL_TABLET | Freq: Every day | ORAL | 6 refills | Status: DC
  Filled 2021-12-31: qty 30, 30d supply, fill #0
  Filled 2022-02-01: qty 30, 30d supply, fill #1
  Filled 2022-02-23: qty 30, 30d supply, fill #2
  Filled 2022-03-29: qty 30, 30d supply, fill #3
  Filled 2022-04-28: qty 30, 30d supply, fill #4
  Filled 2022-05-29: qty 30, 30d supply, fill #5
  Filled 2022-06-21: qty 30, 30d supply, fill #6

## 2021-12-31 NOTE — Telephone Encounter (Signed)
Chart supports Rx Last OV: 07/2021 Next OV: not scheduled, 30 day supply sent, pt needs f/u appt for further refills.

## 2022-01-02 ENCOUNTER — Other Ambulatory Visit (HOSPITAL_COMMUNITY): Payer: Self-pay

## 2022-01-09 ENCOUNTER — Encounter: Payer: Self-pay | Admitting: Nurse Practitioner

## 2022-01-09 ENCOUNTER — Ambulatory Visit
Admission: EM | Admit: 2022-01-09 | Discharge: 2022-01-09 | Disposition: A | Discharge status: Home / Self Care | Payer: 59 | Attending: Internal Medicine | Admitting: Internal Medicine

## 2022-01-09 DIAGNOSIS — R1030 Lower abdominal pain, unspecified: Secondary | ICD-10-CM | POA: Diagnosis not present

## 2022-01-09 MED ORDER — CIPROFLOXACIN HCL 500 MG PO TABS: 500.0000 mg | ORAL_TABLET | Freq: Two times a day (BID) | ORAL | 0 refills | Status: DC

## 2022-01-09 MED ORDER — METRONIDAZOLE 500 MG PO TABS
500.0000 mg | ORAL_TABLET | Freq: Three times a day (TID) | ORAL | 0 refills | Status: DC
  Filled 2022-01-09 – 2022-01-12 (×2): qty 10, 4d supply, fill #0

## 2022-01-09 MED ORDER — METRONIDAZOLE 500 MG PO TABS: 500.0000 mg | ORAL_TABLET | Freq: Two times a day (BID) | ORAL | 0 refills | Status: DC

## 2022-01-09 MED ORDER — METRONIDAZOLE 500 MG PO TABS: 500.0000 mg | ORAL_TABLET | Freq: Two times a day (BID) | ORAL | 0 refills | Status: AC

## 2022-01-09 NOTE — Discharge Instructions (Signed)
I highly recommend that you go to the emergency department if any symptoms persist or worsen.  I have prescribed you 2 antibiotics given suspicion of diverticulitis.  Blood work is pending.  Will call if there are any abnormalities.  Please do not do any strenuous physical activity while taking Cipro as it can cause tendon rupture.

## 2022-01-09 NOTE — ED Provider Notes (Signed)
[facility]    CSN: [id number] Arrival date & time: 01/09/22  1704      History   Chief Complaint Chief Complaint  Patient presents with   Abdominal Pain    HPI Emily Barker is a 46 y.o. female.   Patient presents with lower abdominal pain that started last night.  Patient reports she has history of diverticulitis and this feels like a flareup.  She states that she never has left lower quadrant pain with her diverticulitis and it only occurs in the mid lower abdomen.  She reports mild nausea without vomiting.  Denies constipation or diarrhea or blood in stool.  Denies any known fevers at home but does report that she has had chills.  Patient reports that she is typically given Cipro and Flagyl with improvement of symptoms.  Pain is currently rated 5/10 on pain scale and is constant. Has been followed by GI specialist in the past.  Reports urinary frequency at baseline but no increased urinary frequency or dysuria.   Abdominal Pain   Past Medical History:  Diagnosis Date   Abnormal Pap smear of cervix 2007   CIN 2 + HRHPV/ LEEP   Allergy    Anemia    Anxiety 1275   Complication of anesthesia    bp dropped with epidural   Contraception    husband vasectomy   Diverticulosis    Dysmenorrhea    GERD (gastroesophageal reflux disease)    Hashimoto's thyroiditis    Heart murmur    as a child   Hemangioma, intracranial structures ([place]) 2013   Left Frontal Lobe   Hepatitis A    Hypertension    Hypothyroid    right thyroid nodule   Migraines    with aura   Panic disorder 2012   Sinus tachycardia 2012   Vertigo     Patient Active Problem List   Diagnosis Date Noted   Hearing loss 06/04/2021   Pure hypercholesterolemia 03/20/2020   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   Essential (primary) hypertension 02/18/2016   Cavernous hemangioma of brain ([place]) 05/06/2015   Migraine headache 05/06/2015   Hot flashes 03/13/2014   Migraine,  unspecified, not intractable, without status migrainosus 11/12/2013   Eczema 11/12/2013   Hypertropia 03/02/2012   Hashimoto's thyroiditis 10/09/2011   Anxiety 10/02/2011   Diverticulitis 02/01/2009    Past Surgical History:  Procedure Laterality Date   CERVICAL BIOPSY  W/ LOOP ELECTRODE EXCISION  2007   CIN 2   COLPOSCOPY  2007   CIN 2/ HRHPV   DILATION AND CURETTAGE OF UTERUS     SEPTOPLASTY     TONSILLECTOMY AND ADENOIDECTOMY  1988    OB History     Gravida  5   Para  3   Term      Preterm      AB  2   Living  3      SAB  1   IAB  1   Ectopic      Multiple      Live Births               Home Medications    Prior to Admission medications   Medication Sig Start Date End Date Taking? Authorizing Provider  metroNIDAZOLE (FLAGYL) 500 MG tablet Take 1 tablet (500 mg total) by mouth 3 (three) times daily. 01/09/22  Yes , Hildred Alamin E, FNP  ciprofloxacin (CIPRO) 500 MG tablet Take 1 tablet (500 mg total) by mouth every  12 (twelve) hours. 01/09/22   Teodora Medici, FNP  estradiol (VIVELLE-DOT) 0.05 MG/24HR patch Place 1 patch onto the skin 2 times a week. 12/11/21   Nunzio Cobbs, MD  fluticasone (FLONASE) 50 MCG/ACT nasal spray Place into both nostrils daily as needed.    [provider]  loratadine (CLARITIN) 5 MG chewable tablet Chew 5 mg by mouth as needed.     [provider]  losartan (COZAAR) 25 MG tablet Take 1 tablet (25 mg total) by mouth daily. 12/31/21   Nche, Charlene Brooke, NP  metroNIDAZOLE (FLAGYL) 500 MG tablet Take 1 tablet (500 mg total) by mouth 2 (two) times daily for 10 days. 01/09/22 01/19/22  Teodora Medici, FNP  Multiple Vitamin (MULTIVITAMIN) tablet Take 1 tablet by mouth every other day.     [provider]  progesterone (PROMETRIUM) 200 MG capsule Take 1 capsule by mouth daily at bedtime. 12/09/21   Nunzio Cobbs, MD  Rimegepant Sulfate (NURTEC) 75 MG TBDP Take 75 mg by mouth daily  as needed. For migraines. Take as close to onset of migraine as possible. One daily maximum. 11/15/19   Melvenia Beam, MD  thyroid [facility] THYROID) 15 MG tablet Take one tablet (15 mg dose) by mouth daily. 07/07/21     thyroid (ARMOUR THYROID) 60 MG tablet Take 1 tablet (60 mg) by mouth daily (take with '15mg'$  tablet for a total of '75mg'$  total daily as directed) 12/31/21       Family History Family History  Problem Relation Age of Onset   Hyperlipidemia Mother    Prostate cancer Father 22       Metastatic CA to bones   Hypertension Father    Alcohol abuse Father    Heart disease Father    Cancer Father        Prostate   Breast cancer Maternal Aunt 42   Depression Maternal Aunt    Diabetes Maternal Aunt    Depression Maternal Aunt    Diabetes Maternal Aunt    Heart attack Maternal Uncle    Heart disease Paternal Aunt    Heart attack Paternal Uncle    Dementia Maternal Grandmother    Heart disease Maternal Grandfather    Heart disease Paternal Grandmother    Alcohol abuse Paternal Grandfather    Heart disease Paternal Grandfather    Hypertension Brother    Migraines Neg Hx    Colon cancer Neg Hx    Esophageal cancer Neg Hx    Rectal cancer Neg Hx    Stomach cancer Neg Hx     Social History Social History   Tobacco Use   Smoking status: Never   Smokeless tobacco: Never  Vaping Use   Vaping Use: Never used  Substance Use Topics   Alcohol use: Yes    Alcohol/week: 10.0 - 14.0 standard drinks of alcohol    Types: 10 - 14 Glasses of wine per week    Comment: 1-2 five oz glasses of wine/day   Drug use: No     Allergies   Gluten meal, Miconazole, Other, Penicillin g, Latex, Iodinated contrast media, and Sulfa antibiotics   Review of Systems Review of Systems Per HPI  Physical Exam Triage Vital Signs ED Triage Vitals  Enc Vitals Group     BP 01/09/22 1844 (!) 136/91     Pulse Rate 01/09/22 1844 (!) 109     Resp 01/09/22 1844 19     Temp 01/09/22 1844  98 F  (36.7 C)     Temp src --      SpO2 01/09/22 1844 98 %     Weight --      Height --      Head Circumference --      Peak Flow --      Pain Score 01/09/22 1847 0     Pain Loc --      Pain Edu? --      Excl. in [place]? --    No data found.  Updated Vital Signs BP (!) 136/91   Pulse (!) 109   Temp 98 F (36.7 C)   Resp 19   SpO2 98%   Visual Acuity Right Eye Distance:   Left Eye Distance:   Bilateral Distance:    Right Eye Near:   Left Eye Near:    Bilateral Near:     Physical Exam Constitutional:      General: She is not in acute distress.    Appearance: Normal appearance. She is not toxic-appearing or diaphoretic.  HENT:     Head: Normocephalic and atraumatic.  Eyes:     Extraocular Movements: Extraocular movements intact.     Conjunctiva/sclera: Conjunctivae normal.  Cardiovascular:     Rate and Rhythm: Regular rhythm. Tachycardia present.     Pulses: Normal pulses.     Heart sounds: Normal heart sounds.     Comments: Mild tachycardia noted.  Pulmonary:     Effort: Pulmonary effort is normal. No respiratory distress.     Breath sounds: Normal breath sounds.  Abdominal:     General: Bowel sounds are normal.     Palpations: Abdomen is soft.     Tenderness: There is abdominal tenderness in the suprapubic area.       Comments: Significant tenderness to palpation to lower abdomen.  Neurological:     General: No focal deficit present.     Mental Status: She is alert and oriented to person, place, and time. Mental status is at baseline.  Psychiatric:        Mood and Affect: Mood normal.        Behavior: Behavior normal.        Thought Content: Thought content normal.        Judgment: Judgment normal.      UC Treatments / Results  Labs (all labs ordered are listed, but only abnormal results are displayed) Labs Reviewed  CBC  COMPREHENSIVE METABOLIC PANEL    EKG   Radiology No results found.  Procedures Procedures (including critical care  time)  Medications Ordered in UC Medications - No data to display  Initial Impression / Assessment and Plan / UC Course  I have reviewed the triage vital signs and the nursing notes.  Pertinent labs & imaging results that were available during my care of the patient were reviewed by me and considered in my medical decision making (see chart for details).     Highly recommended to patient that she go to the ER for imaging of the abdomen given significant tenderness to palpation to abdominal area, tachycardia, chills.  Patient states that this feels similar to diverticulitis in the past so given associated chills and tachycardia, perforation could be a factor.  Patient is adamant that she does not want to go to the ER.  Risks associated with not going to the ER were discussed with patient.  Patient voiced understanding and accepted risks.  Given the patient is not going to go to  the ER, will treat with Cipro and Flagyl.  Also obtaining CMP and CBC.  Patient was advised to go straight to the ER if symptoms persist or worsen.  Patient verbalized understanding and was agreeable with plan.  Patient advised to avoid strenuous physical activity while taking Cipro.  Patient has taken both of these medications before and tolerated well.  No obvious contraindications noted to these in history.   Originally sent wrong dosage for metronidazole.  Original prescription was for metronidazole twice daily for 10 days.  I meant to send metronidazole 3 times daily for 10 days per best evidence recommendation for diverticulitis treatment.  Patient had already picked up metronidazole twice daily for 10 days from the pharmacy.  Therefore, called patient to advise her to take this medication 3 times daily and will send 10 pills over to the pharmacy to ensure that she has enough to take 3 times daily for 10 days.  Patient verbalized understanding. Final Clinical Impressions(s) / UC Diagnoses   Final diagnoses:  Lower  abdominal pain     Discharge Instructions      I highly recommend that you go to the emergency department if any symptoms persist or worsen.  I have prescribed you 2 antibiotics given suspicion of diverticulitis.  Blood work is pending.  Will call if there are any abnormalities.  Please do not do any strenuous physical activity while taking Cipro as it can cause tendon rupture.    ED Prescriptions     Medication Sig Dispense Auth. Provider   ciprofloxacin (CIPRO) 500 MG tablet  (Status: Discontinued) Take 1 tablet (500 mg total) by mouth every 12 (twelve) hours. 10 tablet [place], [place] E, [place]   metroNIDAZOLE (FLAGYL) 500 MG tablet  (Status: Discontinued) Take 1 tablet (500 mg total) by mouth 2 (two) times daily for 10 days. 20 tablet [place], [place] E, [place]   ciprofloxacin (CIPRO) 500 MG tablet Take 1 tablet (500 mg total) by mouth every 12 (twelve) hours. 10 tablet [place], [place] E, [place]   metroNIDAZOLE (FLAGYL) 500 MG tablet Take 1 tablet (500 mg total) by mouth 2 (two) times daily for 10 days. 20 tablet [place], [place] E, [place]   metroNIDAZOLE (FLAGYL) 500 MG tablet Take 1 tablet (500 mg total) by mouth 3 (three) times daily. 10 tablet Teodora Medici, [place]      PDMP not reviewed this encounter.   Teodora Medici, [place] 01/09/22 1958

## 2022-01-09 NOTE — ED Triage Notes (Signed)
Pt presents to uc with co of diverticulitis flare up. Pt reports she has pain that comes and goes and at its worse is a 8/10 and 4/10 normally. Pt reports no otc medications symptoms started up last night

## 2022-01-10 ENCOUNTER — Other Ambulatory Visit (HOSPITAL_COMMUNITY): Payer: Self-pay

## 2022-01-11 LAB — COMPREHENSIVE METABOLIC PANEL
ALT: 12 IU/L (ref 0–32)
AST: 13 IU/L (ref 0–40)
Albumin/Globulin Ratio: 1.8 (ref 1.2–2.2)
Albumin: 4.6 g/dL (ref 3.9–4.9)
Alkaline Phosphatase: 86 IU/L (ref 44–121)
BUN/Creatinine Ratio: 11 (ref 9–23)
BUN: 9 mg/dL (ref 6–24)
Bilirubin Total: 0.4 mg/dL (ref 0.0–1.2)
CO2: 23 mmol/L (ref 20–29)
Calcium: 9.8 mg/dL (ref 8.7–10.2)
Chloride: 100 mmol/L (ref 96–106)
Creatinine, Ser: 0.8 mg/dL (ref 0.57–1.00)
Globulin, Total: 2.5 g/dL (ref 1.5–4.5)
Glucose: 112 mg/dL — ABNORMAL HIGH (ref 70–99)
Potassium: 4.3 mmol/L (ref 3.5–5.2)
Sodium: 138 mmol/L (ref 134–144)
Total Protein: 7.1 g/dL (ref 6.0–8.5)
eGFR: 91 mL/min/{1.73_m2} (ref >59)

## 2022-01-11 LAB — CBC
Hematocrit: 43.6 % (ref 34.0–46.6)
Hemoglobin: 14.3 g/dL (ref 11.1–15.9)
MCH: 29.4 pg (ref 26.6–33.0)
MCHC: 32.8 g/dL (ref 31.5–35.7)
MCV: 90 fL (ref 79–97)
Platelets: 240 10*3/uL (ref 150–450)
RBC: 4.87 x10E6/uL (ref 3.77–5.28)
RDW: 12.1 % (ref 11.7–15.4)
WBC: 10.6 10*3/uL (ref 3.4–10.8)

## 2022-01-12 ENCOUNTER — Other Ambulatory Visit: Payer: Self-pay

## 2022-01-12 ENCOUNTER — Other Ambulatory Visit (HOSPITAL_COMMUNITY): Payer: Self-pay

## 2022-02-01 ENCOUNTER — Other Ambulatory Visit: Payer: Self-pay | Admitting: Nurse Practitioner

## 2022-02-01 ENCOUNTER — Other Ambulatory Visit: Payer: Self-pay | Admitting: Neurology

## 2022-02-01 DIAGNOSIS — I10 Essential (primary) hypertension: Secondary | ICD-10-CM

## 2022-02-02 ENCOUNTER — Other Ambulatory Visit: Payer: Self-pay

## 2022-02-02 ENCOUNTER — Other Ambulatory Visit (HOSPITAL_COMMUNITY): Payer: Self-pay

## 2022-02-03 MED ORDER — NURTEC 75 MG PO TBDP: 75.0000 mg | ORAL_TABLET | Freq: Every day | ORAL | 0 refills | Status: DC | PRN

## 2022-02-04 ENCOUNTER — Other Ambulatory Visit: Payer: Self-pay | Admitting: Nurse Practitioner

## 2022-02-04 DIAGNOSIS — H5203 Hypermetropia, bilateral: Secondary | ICD-10-CM | POA: Diagnosis not present

## 2022-02-04 DIAGNOSIS — H524 Presbyopia: Secondary | ICD-10-CM | POA: Diagnosis not present

## 2022-02-04 DIAGNOSIS — H52223 Regular astigmatism, bilateral: Secondary | ICD-10-CM | POA: Diagnosis not present

## 2022-02-04 DIAGNOSIS — I10 Essential (primary) hypertension: Secondary | ICD-10-CM

## 2022-02-04 DIAGNOSIS — Z135 Encounter for screening for eye and ear disorders: Secondary | ICD-10-CM | POA: Diagnosis not present

## 2022-02-05 ENCOUNTER — Other Ambulatory Visit (HOSPITAL_COMMUNITY): Payer: Self-pay

## 2022-02-06 ENCOUNTER — Encounter: Payer: Self-pay | Admitting: Nurse Practitioner

## 2022-02-06 ENCOUNTER — Other Ambulatory Visit (HOSPITAL_COMMUNITY): Payer: Self-pay

## 2022-02-06 ENCOUNTER — Other Ambulatory Visit: Payer: Self-pay | Admitting: Nurse Practitioner

## 2022-02-06 ENCOUNTER — Ambulatory Visit: Payer: Commercial Managed Care - PPO | Admitting: Nurse Practitioner

## 2022-02-06 VITALS — BP 108/66 | HR 96 | Temp 96.8°F | Ht 64.0 in | Wt 187.0 lb

## 2022-02-06 DIAGNOSIS — N61 Mastitis without abscess: Secondary | ICD-10-CM | POA: Diagnosis not present

## 2022-02-06 DIAGNOSIS — I10 Essential (primary) hypertension: Secondary | ICD-10-CM

## 2022-02-06 DIAGNOSIS — F419 Anxiety disorder, unspecified: Secondary | ICD-10-CM | POA: Diagnosis not present

## 2022-02-06 MED ORDER — LOSARTAN POTASSIUM 25 MG PO TABS
25.0000 mg | ORAL_TABLET | Freq: Every day | ORAL | 1 refills | Status: DC
  Filled 2022-02-06 (×2): qty 90, 90d supply, fill #0
  Filled 2022-05-04: qty 90, 90d supply, fill #1

## 2022-02-06 MED ORDER — DOXYCYCLINE HYCLATE 100 MG PO TABS
100.0000 mg | ORAL_TABLET | Freq: Two times a day (BID) | ORAL | 0 refills | Status: AC
  Filled 2022-02-06: qty 20, 10d supply, fill #0

## 2022-02-06 MED ORDER — FLUCONAZOLE 150 MG PO TABS
150.0000 mg | ORAL_TABLET | Freq: Once | ORAL | 0 refills | Status: AC
  Filled 2022-02-06: qty 1, 1d supply, fill #0

## 2022-02-06 MED ORDER — ALPRAZOLAM 0.25 MG PO TABS
0.2500 mg | ORAL_TABLET | Freq: Two times a day (BID) | ORAL | 0 refills | Status: AC | PRN
  Filled 2022-02-06: qty 4, 2d supply, fill #0

## 2022-02-06 NOTE — Progress Notes (Signed)
Acute Office Visit  Subjective:     Patient ID: Emily Barker, female    DOB: Oct 11, 1975, 47 y.o.   MRN: [id number]  Chief Complaint  Patient presents with   Acute Visit     Notice clogged milk duct in left ,  discharge , swelling , sharp pain  , redness ,  going for about Monday , no  pain on the other side     HPI Patient is in today for left breast pain, swelling, and redness. This started on Monday, suddenly. She has been using warm compresses and rubbing her hand along the area. She has not noticed any lymph nodes swelling. She denies fevers. Right breast is normal. Last mammogram in 03/2021 was normal.   ROS See pertinent positives and negatives per HPI.     Objective:    BP 108/66   Pulse 96   Temp (!) 96.8 F (36 C)   Ht '5\' 4"'$  (1.626 m)   Wt 187 lb (84.8 kg)   LMP  (LMP Unknown)   SpO2 97%   BMI 32.10 kg/m    Physical Exam Vitals and nursing note reviewed.  Constitutional:      General: She is not in acute distress.    Appearance: Normal appearance.  HENT:     Head: Normocephalic.  Eyes:     Conjunctiva/sclera: Conjunctivae normal.  Pulmonary:     Effort: Pulmonary effort is normal.  Chest:  Breasts:    Right: Normal.     Left: Swelling, skin change (redness) and tenderness present.  Musculoskeletal:     Cervical back: Normal range of motion.  Lymphadenopathy:     Upper Body:     Right upper body: No supraclavicular, axillary or pectoral adenopathy.     Left upper body: No supraclavicular, axillary or pectoral adenopathy.  Skin:    General: Skin is warm.  Neurological:     General: No focal deficit present.     Mental Status: She is alert and oriented to person, place, and time.  Psychiatric:        Mood and Affect: Mood normal.        Behavior: Behavior normal.        Thought Content: Thought content normal.        Judgment: Judgment normal.      Assessment & Plan:   Problem List Items Addressed This Visit       Other   Anxiety     Situational anxiety with flying. She is requesting xanax 0.'25mg'$  for her flights. PDMP reviewed. 4 tablets given for as needed with anxiety.       Relevant Medications   ALPRAZolam (XANAX) 0.25 MG tablet   Mastitis - Primary    Noted near left breast areola. There is significant tenderness with palpation. Slight erythema to surrounding area. No lymph nodes swelling. Last mammogram 03/2021 was normal. May be inflammation vs infection vs mass. Will have her continue warm compresses. Start doxycycline '100mg'$  BID x10 days. Follow-up if symptoms don't improve and will order breast ultrasound. She is going out of town for a few weeks starting tomorrow.  Will also send in diflucan x1 as needed for yeast infection with antibiotics. She has been able to take this in the past with her miconazole allergy.       Meds ordered this encounter  Medications   doxycycline (VIBRA-TABS) 100 MG tablet    Sig: Take 1 tablet (100 mg total) by mouth 2 (two) times daily.  Dispense:  20 tablet    Refill:  0   fluconazole (DIFLUCAN) 150 MG tablet    Sig: Take 1 tablet (150 mg total) by mouth once for 1 dose. As needed after finishing antibiotic    Dispense:  1 tablet    Refill:  0   ALPRAZolam (XANAX) 0.25 MG tablet    Sig: Take 1 tablet (0.25 mg total) by mouth 2 (two) times daily as needed for anxiety.    Dispense:  4 tablet    Refill:  0    Return if symptoms worsen or fail to improve.  Charyl Dancer, NP

## 2022-02-06 NOTE — Assessment & Plan Note (Addendum)
Noted near left breast areola. There is significant tenderness with palpation. Slight erythema to surrounding area. No lymph nodes swelling. Last mammogram 03/2021 was normal. May be inflammation vs infection vs mass. Will have her continue warm compresses. Start doxycycline '100mg'$  BID x10 days. Follow-up if symptoms don't improve and will order breast ultrasound. She is going out of town for a few weeks starting tomorrow.  Will also send in diflucan x1 as needed for yeast infection with antibiotics. She has been able to take this in the past with her miconazole allergy.

## 2022-02-06 NOTE — Telephone Encounter (Signed)
Spoke to pt and provider regarding refill denial. Emily Barker reviewed chart and sending in 90 day supply with additional refill. Pt was notified this is being sent to [facility].

## 2022-02-06 NOTE — Assessment & Plan Note (Signed)
Situational anxiety with flying. She is requesting xanax 0.'25mg'$  for her flights. PDMP reviewed. 4 tablets given for as needed with anxiety.

## 2022-02-06 NOTE — Patient Instructions (Signed)
It was great to see you!  Keep doing the warm compresses multiple times a day. You can keep doing the stimulation. I have sent in an antibiotic to take twice a day for 10 days with food if need.   Let's follow-up if your symptoms worsen or don't improve.   Take care,  Vance Peper, NP

## 2022-02-23 ENCOUNTER — Other Ambulatory Visit (HOSPITAL_COMMUNITY): Payer: Self-pay

## 2022-02-23 ENCOUNTER — Other Ambulatory Visit: Payer: Self-pay

## 2022-02-25 ENCOUNTER — Telehealth: Payer: Self-pay | Admitting: Neurology

## 2022-02-25 NOTE — Telephone Encounter (Signed)
Please submit a botox PA with her new [employer] insurance 200 units every 12 weeks for migraines J0585/G43.709

## 2022-02-26 ENCOUNTER — Other Ambulatory Visit (HOSPITAL_COMMUNITY): Payer: Self-pay

## 2022-02-26 NOTE — Telephone Encounter (Signed)
BOTOX ONE-Benefit Verification [id number] Submitted!

## 2022-02-26 NOTE — Telephone Encounter (Signed)
Chronic Migraine CPT 64615  Botox J0585 Units:200  G43.709 Chronic Migraine without aura, not intractable, without status migrainous   

## 2022-02-26 NOTE — Telephone Encounter (Signed)
Pharmacy Patient Advocate Encounter   Received notification from [facility] that prior authorization for Botox 200UNIT solution is required/requested.    PA submitted on 02/26/2022 to (ins) [facility] via CoverMyMeds Key [id number]  Status is pending

## 2022-02-27 ENCOUNTER — Other Ambulatory Visit (HOSPITAL_COMMUNITY): Payer: Self-pay

## 2022-02-27 ENCOUNTER — Other Ambulatory Visit: Payer: Self-pay

## 2022-03-02 ENCOUNTER — Other Ambulatory Visit: Payer: Self-pay

## 2022-03-02 ENCOUNTER — Other Ambulatory Visit (HOSPITAL_COMMUNITY): Payer: Self-pay

## 2022-03-02 MED ORDER — ONABOTULINUMTOXINA 200 UNITS IJ SOLR
INTRAMUSCULAR | 3 refills | Status: AC
  Filled 2022-03-02: qty 1, 84d supply, fill #0
  Filled 2022-05-14: qty 1, 84d supply, fill #1

## 2022-03-02 NOTE — Telephone Encounter (Signed)
Botox Rx sent to [facility]

## 2022-03-02 NOTE — Telephone Encounter (Signed)
Pharmacy Patient Advocate Encounter  Prior Authorization for Botox 200UNIT solution has been approved.    PA# PA Case ID: [id number] Effective dates: 02/28/2022 through 08/27/2022 Per test billing copay is $750.00 This can be filled at [facility]

## 2022-03-02 NOTE — Addendum Note (Signed)
Addended by: Gildardo Griffes on: 03/02/2022 12:09 PM   Modules accepted: Orders

## 2022-03-02 NOTE — Telephone Encounter (Signed)
Can you please sent script to [facility]?

## 2022-03-03 ENCOUNTER — Other Ambulatory Visit: Payer: Self-pay | Admitting: Obstetrics and Gynecology

## 2022-03-03 ENCOUNTER — Ambulatory Visit: Payer: Commercial Managed Care - PPO | Admitting: Neurology

## 2022-03-03 DIAGNOSIS — G43709 Chronic migraine without aura, not intractable, without status migrainosus: Secondary | ICD-10-CM

## 2022-03-03 DIAGNOSIS — Z1231 Encounter for screening mammogram for malignant neoplasm of breast: Secondary | ICD-10-CM

## 2022-03-03 MED ORDER — ONABOTULINUMTOXINA 200 UNITS IJ SOLR
155.0000 [IU] | Freq: Once | INTRAMUSCULAR | Status: AC
  Administered 2022-03-03: 155 [IU] via INTRAMUSCULAR

## 2022-03-03 NOTE — Progress Notes (Signed)
Consent Form Botulism Toxin Injection For Chronic Migraine  03/03/2022: stable.  12/09/2021: She is still doing great, > 60% decrease in migraine freq and severity since botox.   Meds ordered this encounter  Medications   botulinum toxin Type A (BOTOX) injection 155 Units    Botox- 200 units x 1 vial Lot: [id number] Expiration: 06/2024 NDC: 6834-1962-22   Bacteriostatic 0.9% Sodium Chloride- 55m total Lot: [id number]: 11/25 NDC: 619417-408-14  Dx: GG81.856S/P     Reviewed orally with patient, additionally signature is on file:  Botulism toxin has been approved by the Federal drug administration for treatment of chronic migraine. Botulism toxin does not cure chronic migraine and it may not be effective in some patients.  The administration of botulism toxin is accomplished by injecting a small amount of toxin into the muscles of the neck and head. Dosage must be titrated for each individual. Any benefits resulting from botulism toxin tend to wear off after 3 months with a repeat injection required if benefit is to be maintained. Injections are usually done every 3-4 months with maximum effect peak achieved by about 2 or 3 weeks. Botulism toxin is expensive and you should be sure of what costs you will incur resulting from the injection.  The side effects of botulism toxin use for chronic migraine may include:   -Transient, and usually mild, facial weakness with facial injections  -Transient, and usually mild, head or neck weakness with head/neck injections  -Reduction or loss of forehead facial animation due to forehead muscle weakness  -Eyelid drooping  -Dry eye  -Pain at the site of injection or bruising at the site of injection  -Double vision  -Potential unknown long term risks  Contraindications: You should not have Botox if you are pregnant, nursing, allergic to albumin, have an infection, skin condition, or muscle weakness at the site of the injection, or have  myasthenia gravis, Lambert-Eaton syndrome, or ALS.  It is also possible that as with any injection, there may be an allergic reaction or no effect from the medication. Reduced effectiveness after repeated injections is sometimes seen and rarely infection at the injection site may occur. All care will be taken to prevent these side effects. If therapy is given over a long time, atrophy and wasting in the muscle injected may occur. Occasionally the patient's become refractory to treatment because they develop antibodies to the toxin. In this event, therapy needs to be modified.  I have read the above information and consent to the administration of botulism toxin.    BOTOX PROCEDURE NOTE FOR MIGRAINE HEADACHE    Contraindications and precautions discussed with patient(above). Aseptic procedure was observed and patient tolerated procedure. Procedure performed by Dr. TGeorgia Dom The condition has existed for more than 6 months, and pt does not have a diagnosis of ALS, Myasthenia Gravis or Lambert-Eaton Syndrome.  Risks and benefits of injections discussed and pt agrees to proceed with the procedure.  Written consent obtained  These injections are medically necessary. Pt  receives good benefits from these injections. These injections do not cause sedations or hallucinations which the oral therapies may cause.  Description of procedure:  The patient was placed in a sitting position. The standard protocol was used for Botox as follows, with 5 units of Botox injected at each site:   -Procerus muscle, midline injection  -Corrugator muscle, bilateral injection  -Frontalis muscle, bilateral injection, with 2 sites each side, medial injection was performed in the upper one  third of the frontalis muscle, in the region vertical from the medial inferior edge of the superior orbital rim. The lateral injection was again in the upper one third of the forehead vertically above the lateral limbus of the  cornea, 1.5 cm lateral to the medial injection site.   -Temporalis muscle injection, 4 sites, bilaterally. The first injection was 3 cm above the tragus of the ear, second injection site was 1.5 cm to 3 cm up from the first injection site in line with the tragus of the ear. The third injection site was 1.5-3 cm forward between the first 2 injection sites. The fourth injection site was 1.5 cm posterior to the second injection site.    -Occipitalis muscle injection, 3 sites, bilaterally. The first injection was done one half way between the occipital protuberance and the tip of the mastoid process behind the ear. The second injection site was done lateral and superior to the first, 1 fingerbreadth from the first injection. The third injection site was 1 of the last medication how things tell me about what what you mean you are moving to [place] are current for July an office okay to start hurting you you exciting how much I would like to get out of the [place] is not on the water is on the water yes please see which is okay fingerbreadth superiorly and medially from the first injection site.  -Cervical paraspinal muscle injection, 2 sites, bilateral knee first injection site was 1 cm from the midline of the cervical spine, 3 cm inferior to the lower border of the occipital protuberance. The second injection site was 1.5 cm superiorly and laterally to the first injection site.  -Trapezius muscle injection was performed at 3 sites, bilaterally. The first injection site was in the upper trapezius muscle halfway between the inflection point of the neck, and the acromion. The second injection site was one half way between the acromion and the first injection site. The third injection was done between the first injection site and the inflection point of the neck.   Will return for repeat injection in 3 months.   A 200 unit sof Botox was used, 45 Botox not injected was wasted. The  patient tolerated the procedure well, there were no complications of the above procedure.

## 2022-03-03 NOTE — Progress Notes (Signed)
Botox- 200 units x 1 vial Lot: [id number] Expiration: 06/2024 NDC: 7915-0569-79  Bacteriostatic 0.9% Sodium Chloride- 47m total Lot: [id number]: 11/25 NDC: 637482-707-86 Dx: GL54.492S/P

## 2022-03-17 ENCOUNTER — Other Ambulatory Visit: Payer: Self-pay

## 2022-03-30 ENCOUNTER — Other Ambulatory Visit: Payer: Self-pay

## 2022-04-15 ENCOUNTER — Ambulatory Visit: Payer: Commercial Managed Care - PPO

## 2022-04-28 ENCOUNTER — Other Ambulatory Visit: Payer: Self-pay

## 2022-05-04 ENCOUNTER — Other Ambulatory Visit (HOSPITAL_COMMUNITY): Payer: Self-pay

## 2022-05-14 ENCOUNTER — Other Ambulatory Visit (HOSPITAL_COMMUNITY): Payer: Self-pay

## 2022-05-20 ENCOUNTER — Other Ambulatory Visit: Payer: Self-pay | Admitting: Neurology

## 2022-05-25 ENCOUNTER — Other Ambulatory Visit: Payer: Self-pay

## 2022-05-26 MED ORDER — NURTEC 75 MG PO TBDP: 75.0000 mg | ORAL_TABLET | Freq: Every day | ORAL | 0 refills | Status: DC | PRN

## 2022-05-29 ENCOUNTER — Other Ambulatory Visit: Payer: Self-pay

## 2022-05-29 ENCOUNTER — Ambulatory Visit
Admission: RE | Admit: 2022-05-29 | Discharge: 2022-05-29 | Disposition: A | Discharge status: Home / Self Care | Payer: Commercial Managed Care - PPO | Source: Ambulatory Visit | Attending: Obstetrics and Gynecology | Admitting: Obstetrics and Gynecology

## 2022-05-29 DIAGNOSIS — Z1231 Encounter for screening mammogram for malignant neoplasm of breast: Secondary | ICD-10-CM | POA: Diagnosis not present

## 2022-06-02 ENCOUNTER — Other Ambulatory Visit: Payer: Self-pay | Admitting: Obstetrics and Gynecology

## 2022-06-02 ENCOUNTER — Ambulatory Visit: Payer: Commercial Managed Care - PPO | Admitting: Neurology

## 2022-06-02 DIAGNOSIS — R928 Other abnormal and inconclusive findings on diagnostic imaging of breast: Secondary | ICD-10-CM

## 2022-06-02 DIAGNOSIS — G43709 Chronic migraine without aura, not intractable, without status migrainosus: Secondary | ICD-10-CM | POA: Diagnosis not present

## 2022-06-02 DIAGNOSIS — G43009 Migraine without aura, not intractable, without status migrainosus: Secondary | ICD-10-CM

## 2022-06-02 MED ORDER — NURTEC 75 MG PO TBDP: 75.0000 mg | ORAL_TABLET | Freq: Every day | ORAL | 0 refills | Status: DC | PRN

## 2022-06-02 MED ORDER — ONABOTULINUMTOXINA 200 UNITS IJ SOLR
155.0000 [IU] | Freq: Once | INTRAMUSCULAR | Status: AC
  Administered 2022-06-02: 155 [IU] via INTRAMUSCULAR

## 2022-06-02 NOTE — Progress Notes (Signed)
Botox- 200 units x 1 vial Lot: [id number] Expiration: 06/2024 NDC: 0023-3921-02  Bacteriostatic 0.9% Sodium Chloride- * mL  Lot: [id number] Expiration: 11/2023 NDC: 63323-924-03  Dx: G43.709  S/P Witnessed by Sandy,RN  

## 2022-06-02 NOTE — Progress Notes (Signed)
Consent Form Botulism Toxin Injection For Chronic Migraine  06/02/2022: stable moving to [place]. On botox she has 4 migraines a month and < 8 total headache days a month. Prescribe nurtec for acute management.  03/03/2022: stable.  12/09/2021: She is still doing great, > 60% decrease in migraine freq and severity since botox.   Meds ordered this encounter  Medications   botulinum toxin Type A (BOTOX) injection 155 Units    Botox- 200 units x 1 vial Lot: [id number] Expiration: 06/2024  NDC: 1308-6578-46  Bacteriostatic 0.9% Sodium Chloride- * mL  Lot: [id number] Expiration: 11/2023 NDC: 41324-401-02  Dx: G43.709 S/P  Witnessed by Delmer Islam   Rimegepant Sulfate (NURTEC) 75 MG TBDP    Sig: Take 1 tablet (75 mg total) by mouth daily as needed. For migraines. Take as close to onset of migraine as possible. One daily maximum.    Dispense:  4 tablet    Refill:  0     Reviewed orally with patient, additionally signature is on file:  Botulism toxin has been approved by the Federal drug administration for treatment of chronic migraine. Botulism toxin does not cure chronic migraine and it may not be effective in some patients.  The administration of botulism toxin is accomplished by injecting a small amount of toxin into the muscles of the neck and head. Dosage must be titrated for each individual. Any benefits resulting from botulism toxin tend to wear off after 3 months with a repeat injection required if benefit is to be maintained. Injections are usually done every 3-4 months with maximum effect peak achieved by about 2 or 3 weeks. Botulism toxin is expensive and you should be sure of what costs you will incur resulting from the injection.  The side effects of botulism toxin use for chronic migraine may include:   -Transient, and usually mild, facial weakness with facial injections  -Transient, and usually mild, head or neck weakness with head/neck injections  -Reduction or loss of  forehead facial animation due to forehead muscle weakness  -Eyelid drooping  -Dry eye  -Pain at the site of injection or bruising at the site of injection  -Double vision  -Potential unknown long term risks  Contraindications: You should not have Botox if you are pregnant, nursing, allergic to albumin, have an infection, skin condition, or muscle weakness at the site of the injection, or have myasthenia gravis, Lambert-Eaton syndrome, or ALS.  It is also possible that as with any injection, there may be an allergic reaction or no effect from the medication. Reduced effectiveness after repeated injections is sometimes seen and rarely infection at the injection site may occur. All care will be taken to prevent these side effects. If therapy is given over a long time, atrophy and wasting in the muscle injected may occur. Occasionally the patient's become refractory to treatment because they develop antibodies to the toxin. In this event, therapy needs to be modified.  I have read the above information and consent to the administration of botulism toxin.    BOTOX PROCEDURE NOTE FOR MIGRAINE HEADACHE    Contraindications and precautions discussed with patient(above). Aseptic procedure was observed and patient tolerated procedure. Procedure performed by Dr. Artemio Aly  The condition has existed for more than 6 months, and pt does not have a diagnosis of ALS, Myasthenia Gravis or Lambert-Eaton Syndrome.  Risks and benefits of injections discussed and pt agrees to proceed with the procedure.  Written consent obtained  These injections are medically necessary. Pt  receives good benefits from these injections. These injections do not cause sedations or hallucinations which the oral therapies may cause.  Description of procedure:  The patient was placed in a sitting position. The standard protocol was used for Botox as follows, with 5 units of Botox injected at each site:   -Procerus muscle,  midline injection  -Corrugator muscle, bilateral injection  -Frontalis muscle, bilateral injection, with 2 sites each side, medial injection was performed in the upper one third of the frontalis muscle, in the region vertical from the medial inferior edge of the superior orbital rim. The lateral injection was again in the upper one third of the forehead vertically above the lateral limbus of the cornea, 1.5 cm lateral to the medial injection site.   -Temporalis muscle injection, 4 sites, bilaterally. The first injection was 3 cm above the tragus of the ear, second injection site was 1.5 cm to 3 cm up from the first injection site in line with the tragus of the ear. The third injection site was 1.5-3 cm forward between the first 2 injection sites. The fourth injection site was 1.5 cm posterior to the second injection site.    -Occipitalis muscle injection, 3 sites, bilaterally. The first injection was done one half way between the occipital protuberance and the tip of the mastoid process behind the ear. The second injection site was done lateral and superior to the first, 1 fingerbreadth from the first injection. The third injection site was 1 of the last medication how things tell me about what what you mean you are moving to [place] permanently [place] are current for July an office okay to start hurting you you exciting how much I would like to get out of the [place] is not on the water is on the water yes please see which is okay fingerbreadth superiorly and medially from the first injection site.  -Cervical paraspinal muscle injection, 2 sites, bilateral knee first injection site was 1 cm from the midline of the cervical spine, 3 cm inferior to the lower border of the occipital protuberance. The second injection site was 1.5 cm superiorly and laterally to the first injection site.  -Trapezius muscle injection was performed at 3 sites, bilaterally. The first injection site was in the upper  trapezius muscle halfway between the inflection point of the neck, and the acromion. The second injection site was one half way between the acromion and the first injection site. The third injection was done between the first injection site and the inflection point of the neck.   Will return for repeat injection in 3 months.   A 200 unit sof Botox was used, 45 Botox not injected was wasted. The patient tolerated the procedure well, there were no complications of the above procedure.

## 2022-06-09 ENCOUNTER — Ambulatory Visit
Admission: RE | Admit: 2022-06-09 | Discharge: 2022-06-09 | Disposition: A | Discharge status: Home / Self Care | Payer: Commercial Managed Care - PPO | Source: Ambulatory Visit | Attending: Obstetrics and Gynecology | Admitting: Obstetrics and Gynecology

## 2022-06-09 DIAGNOSIS — R922 Inconclusive mammogram: Secondary | ICD-10-CM | POA: Diagnosis not present

## 2022-06-09 DIAGNOSIS — R928 Other abnormal and inconclusive findings on diagnostic imaging of breast: Secondary | ICD-10-CM

## 2022-06-09 DIAGNOSIS — N6002 Solitary cyst of left breast: Secondary | ICD-10-CM | POA: Diagnosis not present

## 2022-06-14 ENCOUNTER — Other Ambulatory Visit: Payer: Self-pay | Admitting: Neurology

## 2022-06-14 DIAGNOSIS — G43009 Migraine without aura, not intractable, without status migrainosus: Secondary | ICD-10-CM

## 2022-06-15 ENCOUNTER — Other Ambulatory Visit: Payer: Self-pay

## 2022-06-16 ENCOUNTER — Other Ambulatory Visit (HOSPITAL_COMMUNITY): Payer: Self-pay

## 2022-06-16 MED ORDER — NURTEC 75 MG PO TBDP: 75.0000 mg | ORAL_TABLET | Freq: Every day | ORAL | 0 refills | Status: DC | PRN

## 2022-06-21 ENCOUNTER — Other Ambulatory Visit: Payer: Self-pay | Admitting: Neurology

## 2022-06-21 DIAGNOSIS — G43009 Migraine without aura, not intractable, without status migrainosus: Secondary | ICD-10-CM

## 2022-06-23 ENCOUNTER — Other Ambulatory Visit: Payer: Self-pay

## 2022-06-24 ENCOUNTER — Other Ambulatory Visit (HOSPITAL_COMMUNITY): Payer: Self-pay

## 2022-06-24 MED ORDER — ARMOUR THYROID 15 MG PO TABS
15.0000 mg | ORAL_TABLET | Freq: Every day | ORAL | 3 refills | Status: AC
  Filled 2022-06-24 – 2022-07-03 (×2): qty 90, 90d supply, fill #0
  Filled 2022-07-17 – 2022-07-23 (×3): qty 90, 90d supply, fill #1
  Filled 2022-10-25: qty 90, 90d supply, fill #2
  Filled 2022-12-18: qty 90, 90d supply, fill #3

## 2022-06-24 MED ORDER — ARMOUR THYROID 60 MG PO TABS
60.0000 mg | ORAL_TABLET | Freq: Every day | ORAL | 3 refills | Status: AC
  Filled 2022-06-24 – 2022-07-17 (×4): qty 90, 90d supply, fill #0
  Filled 2022-10-25: qty 90, 90d supply, fill #1
  Filled 2022-12-18: qty 90, 90d supply, fill #2

## 2022-06-25 ENCOUNTER — Other Ambulatory Visit: Payer: Self-pay | Admitting: *Deleted

## 2022-06-25 ENCOUNTER — Other Ambulatory Visit: Payer: Self-pay

## 2022-06-25 MED ORDER — NURTEC 75 MG PO TBDP
75.0000 mg | ORAL_TABLET | Freq: Every day | ORAL | 11 refills | Status: AC | PRN
  Filled 2022-06-25: qty 8, 8d supply, fill #0
  Filled 2022-07-22: qty 8, 30d supply, fill #0
  Filled 2022-07-23: qty 8, 8d supply, fill #0

## 2022-06-26 ENCOUNTER — Other Ambulatory Visit: Payer: Self-pay

## 2022-06-26 ENCOUNTER — Other Ambulatory Visit (HOSPITAL_COMMUNITY): Payer: Self-pay

## 2022-06-30 ENCOUNTER — Other Ambulatory Visit (HOSPITAL_COMMUNITY): Payer: Self-pay

## 2022-07-03 ENCOUNTER — Other Ambulatory Visit: Payer: Self-pay

## 2022-07-03 ENCOUNTER — Other Ambulatory Visit (HOSPITAL_COMMUNITY): Payer: Self-pay

## 2022-07-14 ENCOUNTER — Telehealth: Payer: Self-pay

## 2022-07-14 ENCOUNTER — Other Ambulatory Visit (HOSPITAL_COMMUNITY): Payer: Self-pay

## 2022-07-14 NOTE — Telephone Encounter (Signed)
Pharmacy Patient Advocate Encounter   Received notification from [employer] that prior authorization for Nurtec 75MG  dispersible tablets is required/requested.   PA submitted to [facility] via CoverMyMeds Key or (Medicaid) confirmation # [id number]  Status is pending

## 2022-07-16 ENCOUNTER — Other Ambulatory Visit: Payer: Self-pay | Admitting: Nurse Practitioner

## 2022-07-16 ENCOUNTER — Other Ambulatory Visit (HOSPITAL_COMMUNITY): Payer: Self-pay

## 2022-07-16 DIAGNOSIS — I10 Essential (primary) hypertension: Secondary | ICD-10-CM

## 2022-07-17 ENCOUNTER — Other Ambulatory Visit: Payer: Self-pay

## 2022-07-17 ENCOUNTER — Other Ambulatory Visit (HOSPITAL_COMMUNITY): Payer: Self-pay

## 2022-07-22 ENCOUNTER — Other Ambulatory Visit (HOSPITAL_COMMUNITY): Payer: Self-pay

## 2022-07-22 NOTE — Telephone Encounter (Signed)
Received a  fax requesting additional information-faxed completed form and faxed along with clinicals to [phone].

## 2022-07-23 ENCOUNTER — Other Ambulatory Visit (HOSPITAL_COMMUNITY): Payer: Self-pay

## 2022-07-23 ENCOUNTER — Other Ambulatory Visit: Payer: Self-pay

## 2022-07-23 ENCOUNTER — Telehealth: Payer: Self-pay | Admitting: Nurse Practitioner

## 2022-07-23 ENCOUNTER — Other Ambulatory Visit: Payer: Self-pay | Admitting: Nurse Practitioner

## 2022-07-23 DIAGNOSIS — I10 Essential (primary) hypertension: Secondary | ICD-10-CM

## 2022-07-23 MED ORDER — LOSARTAN POTASSIUM 25 MG PO TABS
25.0000 mg | ORAL_TABLET | Freq: Every day | ORAL | 1 refills | Status: AC
  Filled 2022-07-23: qty 90, 90d supply, fill #0

## 2022-07-23 NOTE — Telephone Encounter (Signed)
Prescription Request  07/23/2022  LOV: 08/07/2021  What is the name of the medication or equipment? losartan (COZAAR) 25 MG tablet   Have you contacted your pharmacy to request a refill? Yes - no refills left Pt will be traveling to [place] for work for 3 months. She leaves on 7/8 and not able to come in for physical prior to that date. Pt is scheduled for CPE 11/09/22  Which pharmacy would you like this sent to?  [facility] [address] [place] [address] Phone: [phone] Fax: [phone]    Patient notified that their request is being sent to the clinical staff for review and that they should receive a response within 2 business days.   Please advise at Mobile [phone] (mobile)

## 2022-07-24 ENCOUNTER — Other Ambulatory Visit (HOSPITAL_COMMUNITY): Payer: Self-pay

## 2022-07-27 ENCOUNTER — Other Ambulatory Visit (HOSPITAL_COMMUNITY): Payer: Self-pay

## 2022-07-27 NOTE — Telephone Encounter (Signed)
Pharmacy Patient Advocate Encounter  Prior Authorization for Nurtec 75MG  dispersible tablets has been APPROVED by [facility] from 07/24/2022 to 07/23/2023.   PA # PA Case ID #: [id number]  However when running the test claim pt is no longer covered as of July 1st 2024.

## 2022-08-05 ENCOUNTER — Other Ambulatory Visit (HOSPITAL_COMMUNITY): Payer: Self-pay

## 2022-08-18 ENCOUNTER — Other Ambulatory Visit: Payer: Self-pay

## 2022-08-20 ENCOUNTER — Other Ambulatory Visit (HOSPITAL_COMMUNITY): Payer: Self-pay

## 2022-10-26 ENCOUNTER — Other Ambulatory Visit: Payer: Self-pay

## 2022-10-26 ENCOUNTER — Other Ambulatory Visit (HOSPITAL_COMMUNITY): Payer: Self-pay

## 2022-11-09 ENCOUNTER — Telehealth: Payer: Self-pay | Admitting: Nurse Practitioner

## 2022-11-09 ENCOUNTER — Encounter: Payer: Self-pay | Admitting: Nurse Practitioner

## 2022-11-09 NOTE — Telephone Encounter (Signed)
11/09/22 - No show letter sent.

## 2022-11-10 NOTE — Telephone Encounter (Signed)
1st no show, letter sent via mail & sent text

## 2022-11-30 NOTE — Progress Notes (Deleted)
47 y.o. G22P0023 Married Hispanic female here for annual exam.    PCP: Nche, Bonna Gains, NP   No LMP recorded.           Sexually active: Yes.    The current method of family planning is vasectomy.    Menopausal hormone therapy:  progesterone Exercising: {yes no:314532}  {types:19826} Smoker:  no  OB History  Gravida Para Term Preterm AB Living  5 3     2 3   SAB IAB Ectopic Multiple Live Births  1 1          # Outcome Date GA Lbr Len/2nd Weight Sex Type Anes PTL Lv  5 IAB           4 SAB           3 Para           2 Para           1 Para              HEALTH MAINTENANCE:    Component Value Date/Time   DIAGPAP  12/09/2021 1506    - Negative for Intraepithelial Lesions or Malignancy (NILM)   DIAGPAP - Benign reactive/reparative changes 12/09/2021 1506   DIAGPAP - Atypical glandular cells, NOS (A) 11/14/2020 0934   HPVHIGH Negative 12/09/2021 1506   HPVHIGH Negative 11/14/2020 0934   ADEQPAP  12/09/2021 1506    Satisfactory for evaluation; transformation zone component PRESENT.   ADEQPAP  11/14/2020 0934    Satisfactory for evaluation; transformation zone component PRESENT.   ADEQPAP  07/12/2017 0000    Satisfactory for evaluation  endocervical/transformation zone component PRESENT.    History of abnormal Pap or positive HPV:  yes, AGUS pap on 07/14/20. Colpo:  negative ECC, bx CIN I, benign EMB. Hx LEEP - CIN II. Mammogram: 06/09/22 Breast Density Cat B, BI-RADS CAT 2 benign Colonoscopy:  01/21/18 Bone Density:  n/a  Result  n/a   Immunization History  Administered Date(s) Administered   Influenza,inj,Quad PF,6+ Mos 11/08/2013, 10/09/2015, 10/29/2016, 10/29/2017, 09/26/2020   Influenza-Unspecified 11/08/2013, 10/09/2015, 10/21/2018   PFIZER Comirnaty(Gray Top)Covid-19 Tri-Sucrose Vaccine 03/10/2019, 07/21/2019, 12/07/2019   Tdap 11/14/2020      reports that she has never smoked. She has never used smokeless tobacco. She reports current alcohol use of about  10.0 - 14.0 standard drinks of alcohol per week. She reports that she does not use drugs.  Past Medical History:  Diagnosis Date   Abnormal Pap smear of cervix 2007   CIN 2 + HRHPV/ LEEP   Allergy    Anemia    Anxiety 2012   Complication of anesthesia    bp dropped with epidural   Contraception    husband vasectomy   Diverticulosis    Dysmenorrhea    GERD (gastroesophageal reflux disease)    Hashimoto's thyroiditis    Heart murmur    as a child   Hemangioma, intracranial structures (HCC) 2013   Left Frontal Lobe   Hepatitis A    Hypertension    Hypothyroid    right thyroid nodule   Migraines    with aura   Panic disorder 2012   Sinus tachycardia 2012   Vertigo     Past Surgical History:  Procedure Laterality Date   CERVICAL BIOPSY  W/ LOOP ELECTRODE EXCISION  2007   CIN 2   COLPOSCOPY  2007   CIN 2/ HRHPV   DILATION AND CURETTAGE OF UTERUS     SEPTOPLASTY  TONSILLECTOMY AND ADENOIDECTOMY  1988    Current Outpatient Medications  Medication Sig Dispense Refill   ALPRAZolam (XANAX) 0.25 MG tablet Take 1 tablet (0.25 mg total) by mouth 2 (two) times daily as needed for anxiety. 4 tablet 0   ARMOUR THYROID 15 MG tablet Take 1 tablet (15 mg total) by mouth daily. 90 tablet 3   ARMOUR THYROID 60 MG tablet Take 1 tablet (60 mg total) by mouth daily. (Take with the 15mg  tablet for a total of 75mg  daily as directed) 90 tablet 3   botulinum toxin Type A (BOTOX) 200 units injection Provider to inject 155 units into the muscles of the head and neck every 12 weeks. Discard remainder. 1 each 3   doxycycline (VIBRA-TABS) 100 MG tablet Take 1 tablet (100 mg total) by mouth 2 (two) times daily. 20 tablet 0   estradiol (VIVELLE-DOT) 0.05 MG/24HR patch Place 1 patch onto the skin 2 times a week. 24 patch 3   fluticasone (FLONASE) 50 MCG/ACT nasal spray Place into both nostrils daily as needed.     loratadine (CLARITIN) 5 MG chewable tablet Chew 5 mg by mouth as needed.       losartan (COZAAR) 25 MG tablet Take 1 tablet (25 mg total) by mouth daily. Needs appt in May for additional refills 90 tablet 1   Multiple Vitamin (MULTIVITAMIN) tablet Take 1 tablet by mouth every other day.      progesterone (PROMETRIUM) 200 MG capsule Take 1 capsule (200 mg total) by mouth at bedtime. 90 capsule 3   Rimegepant Sulfate (NURTEC) 75 MG TBDP Take 1 tablet (75 mg total) by mouth daily as needed. For migraines. Take as close to onset of migraine as possible. One daily maximum. 8 tablet 11   Current Facility-Administered Medications  Medication Dose Route Frequency Provider Last Rate Last Admin   0.9 %  sodium chloride infusion  500 mL Intravenous Once Nandigam, Kavitha V, MD        ALLERGIES: Gluten meal, Miconazole, Other, Penicillin g, Latex, Iodinated contrast media, and Sulfa antibiotics  Family History  Problem Relation Age of Onset   Hyperlipidemia Mother    Prostate cancer Father 71       Metastatic CA to bones   Hypertension Father    Alcohol abuse Father    Heart disease Father    Cancer Father        Prostate   Breast cancer Maternal Aunt 42   Depression Maternal Aunt    Diabetes Maternal Aunt    Depression Maternal Aunt    Diabetes Maternal Aunt    Heart attack Maternal Uncle    Heart disease Paternal Aunt    Heart attack Paternal Uncle    Dementia Maternal Grandmother    Heart disease Maternal Grandfather    Heart disease Paternal Grandmother    Alcohol abuse Paternal Grandfather    Heart disease Paternal Grandfather    Hypertension Brother    Migraines Neg Hx    Colon cancer Neg Hx    Esophageal cancer Neg Hx    Rectal cancer Neg Hx    Stomach cancer Neg Hx     Review of Systems  PHYSICAL EXAM:  There were no vitals taken for this visit.    General appearance: alert, cooperative and appears stated age Head: normocephalic, without obvious abnormality, atraumatic Neck: no adenopathy, supple, symmetrical, trachea midline and thyroid normal to  inspection and palpation Lungs: clear to auscultation bilaterally Breasts: normal appearance, no masses or tenderness,  No nipple retraction or dimpling, No nipple discharge or bleeding, No axillary adenopathy Heart: regular rate and rhythm Abdomen: soft, non-tender; no masses, no organomegaly Extremities: extremities normal, atraumatic, no cyanosis or edema Skin: skin color, texture, turgor normal. No rashes or lesions Lymph nodes: cervical, supraclavicular, and axillary nodes normal. Neurologic: grossly normal  Pelvic: External genitalia:  no lesions              No abnormal inguinal nodes palpated.              Urethra:  normal appearing urethra with no masses, tenderness or lesions              Bartholins and Skenes: normal                 Vagina: normal appearing vagina with normal color and discharge, no lesions              Cervix: no lesions              Pap taken: {yes no:314532} Bimanual Exam:  Uterus:  normal size, contour, position, consistency, mobility, non-tender              Adnexa: no mass, fullness, tenderness              Rectal exam: {yes no:314532}.  Confirms.              Anus:  normal sphincter tone, no lesions  Chaperone was present for exam:  {BSCHAPERONE:31226::"Jacquiline Zurcher F, CMA"}  {LABS (Optional):23779}  ASSESSMENT: Well woman visit with gynecologic exam  ***  PLAN: Mammogram screening discussed. Self breast awareness reviewed. Pap and HRV collected:  {yes no:314532} Guidelines for Calcium, Vitamin D, regular exercise program including cardiovascular and weight bearing exercise. Medication refills:  *** Follow up:  ***    Additional counseling given.  {yes [id number]. ***  total time was spent for this patient encounter, including preparation, face-to-face counseling with the patient, coordination of care, and documentation of the encounter in addition to doing the well woman visit with gynecologic exam.   An After Visit Summary was provided to the  patient.

## 2022-12-14 ENCOUNTER — Ambulatory Visit: Payer: Self-pay | Admitting: Obstetrics and Gynecology

## 2022-12-18 ENCOUNTER — Other Ambulatory Visit: Payer: Self-pay

## 2022-12-21 ENCOUNTER — Other Ambulatory Visit (HOSPITAL_COMMUNITY): Payer: Self-pay

## 2023-07-12 ENCOUNTER — Telehealth: Payer: Self-pay

## 2023-07-12 NOTE — Telephone Encounter (Signed)
 I called her home #.  No answer could not LM.

## 2023-07-12 NOTE — Telephone Encounter (Signed)
 Pharmacy Patient Advocate Encounter   Received notification from CoverMyMeds that prior authorization for Nurtec 75 MG dispersible tablets is due for renewal.   Insurance verification completed.   The patient is insured through [facility]  Action: Patient hasn't been seen in your office in over a year. Plan requires updated chart notes for PA renewal.

## 2023-07-27 NOTE — Telephone Encounter (Signed)
 Looks like she move to [place], from last BOTOX  note.
